# Patient Record
Sex: Female | Born: 2000 | Hispanic: Yes | Smoking: Never smoker
Health system: Southern US, Community
[De-identification: ages and names within clinical notes are randomized; demographics above are authoritative.]

## PROBLEM LIST (undated history)

## (undated) ENCOUNTER — Inpatient Hospital Stay (HOSPITAL_COMMUNITY): Payer: Self-pay

## (undated) DIAGNOSIS — F32A Depression, unspecified: Secondary | ICD-10-CM

## (undated) DIAGNOSIS — F419 Anxiety disorder, unspecified: Secondary | ICD-10-CM

## (undated) DIAGNOSIS — Z789 Other specified health status: Secondary | ICD-10-CM

## (undated) HISTORY — DX: Anxiety disorder, unspecified: F41.9

## (undated) HISTORY — DX: Depression, unspecified: F32.A

## (undated) HISTORY — PX: NO PAST SURGERIES: SHX2092

---
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
## 2019-05-25 DIAGNOSIS — F329 Major depressive disorder, single episode, unspecified: Secondary | ICD-10-CM | POA: Insufficient documentation

## 2019-06-14 ENCOUNTER — Other Ambulatory Visit: Payer: Self-pay

## 2019-06-14 ENCOUNTER — Ambulatory Visit: Payer: Medicaid Other | Attending: Internal Medicine

## 2019-06-14 DIAGNOSIS — Z20822 Contact with and (suspected) exposure to covid-19: Secondary | ICD-10-CM

## 2019-06-15 LAB — NOVEL CORONAVIRUS, NAA: SARS-CoV-2, NAA: NOT DETECTED

## 2019-06-19 ENCOUNTER — Inpatient Hospital Stay (HOSPITAL_COMMUNITY)
Admission: AD | Admit: 2019-06-19 | Discharge: 2019-06-19 | Disposition: A | Discharge status: Home / Self Care | Payer: Medicaid Other | Attending: Obstetrics & Gynecology | Admitting: Obstetrics & Gynecology

## 2019-06-19 ENCOUNTER — Other Ambulatory Visit: Payer: Self-pay

## 2019-06-19 ENCOUNTER — Encounter (HOSPITAL_COMMUNITY): Payer: Self-pay

## 2019-06-19 ENCOUNTER — Inpatient Hospital Stay (HOSPITAL_COMMUNITY): Payer: Medicaid Other

## 2019-06-19 DIAGNOSIS — O26891 Other specified pregnancy related conditions, first trimester: Secondary | ICD-10-CM | POA: Diagnosis not present

## 2019-06-19 DIAGNOSIS — Z3A08 8 weeks gestation of pregnancy: Secondary | ICD-10-CM | POA: Insufficient documentation

## 2019-06-19 DIAGNOSIS — R109 Unspecified abdominal pain: Secondary | ICD-10-CM | POA: Insufficient documentation

## 2019-06-19 DIAGNOSIS — Z3490 Encounter for supervision of normal pregnancy, unspecified, unspecified trimester: Secondary | ICD-10-CM

## 2019-06-19 DIAGNOSIS — Z3A01 Less than 8 weeks gestation of pregnancy: Secondary | ICD-10-CM | POA: Diagnosis not present

## 2019-06-19 DIAGNOSIS — O209 Hemorrhage in early pregnancy, unspecified: Secondary | ICD-10-CM | POA: Insufficient documentation

## 2019-06-19 DIAGNOSIS — Z6790 Unspecified blood type, Rh positive: Secondary | ICD-10-CM

## 2019-06-19 DIAGNOSIS — O26899 Other specified pregnancy related conditions, unspecified trimester: Secondary | ICD-10-CM

## 2019-06-19 HISTORY — DX: Other specified health status: Z78.9

## 2019-06-19 LAB — COMPREHENSIVE METABOLIC PANEL WITH GFR
ALT: 19 U/L (ref 0–44)
AST: 20 U/L (ref 15–41)
Albumin: 3.4 g/dL — ABNORMAL LOW (ref 3.5–5.0)
Alkaline Phosphatase: 56 U/L (ref 38–126)
Anion gap: 8 (ref 5–15)
BUN: 5 mg/dL — ABNORMAL LOW (ref 6–20)
CO2: 24 mmol/L (ref 22–32)
Calcium: 8.9 mg/dL (ref 8.9–10.3)
Chloride: 104 mmol/L (ref 98–111)
Creatinine, Ser: 0.48 mg/dL (ref 0.44–1.00)
GFR calc Af Amer: >60 mL/min
GFR calc non Af Amer: >60 mL/min
Glucose, Bld: 97 mg/dL (ref 70–99)
Potassium: 4 mmol/L (ref 3.5–5.1)
Sodium: 136 mmol/L (ref 135–145)
Total Bilirubin: 0.6 mg/dL (ref 0.3–1.2)
Total Protein: 6.5 g/dL (ref 6.5–8.1)

## 2019-06-19 LAB — URINALYSIS, ROUTINE W REFLEX MICROSCOPIC
Bilirubin Urine: NEGATIVE
Glucose, UA: NEGATIVE mg/dL
Hgb urine dipstick: NEGATIVE
Ketones, ur: NEGATIVE mg/dL
Leukocytes,Ua: NEGATIVE
Nitrite: NEGATIVE
Protein, ur: NEGATIVE mg/dL
Specific Gravity, Urine: 1.014 (ref 1.005–1.030)
pH: 7 (ref 5.0–8.0)

## 2019-06-19 LAB — WET PREP, GENITAL
Clue Cells Wet Prep HPF POC: NONE SEEN
Sperm: NONE SEEN
Trich, Wet Prep: NONE SEEN
Yeast Wet Prep HPF POC: NONE SEEN

## 2019-06-19 LAB — CBC
HCT: 38.5 % (ref 36.0–46.0)
Hemoglobin: 12.2 g/dL (ref 12.0–15.0)
MCH: 24.3 pg — ABNORMAL LOW (ref 26.0–34.0)
MCHC: 31.7 g/dL (ref 30.0–36.0)
MCV: 76.7 fL — ABNORMAL LOW (ref 80.0–100.0)
Platelets: 240 10*3/uL (ref 150–400)
RBC: 5.02 MIL/uL (ref 3.87–5.11)
RDW: 16.3 % — ABNORMAL HIGH (ref 11.5–15.5)
WBC: 8 10*3/uL (ref 4.0–10.5)
nRBC: 0 % (ref 0.0–0.2)

## 2019-06-19 LAB — HCG, QUANTITATIVE, PREGNANCY: hCG, Beta Chain, Quant, S: 30967 m[IU]/mL — ABNORMAL HIGH (ref <5)

## 2019-06-19 NOTE — MAU Provider Note (Signed)
History     CSN: [id number]  Arrival date and time: 06/19/19 5277   First Provider Initiated Contact with Patient 06/19/19 1149      Chief Complaint  Patient presents with  . Vaginal Bleeding  . Abdominal Pain   [name] is a 19 y.o. G1P0 at [redacted]w[redacted]d who presents to MAU for pelvic pain. Pt reports she experienced this pain before when she had chlamydia. Pt reports she experienced "light pink blood" on Saturday of last week, not present today.  Onset: 2days ago Location: pelvis, bilateral Duration: 2days Character: "tug," cramp, denies sharp pain Aggravating/Associated: none/none Relieving: drinking water Treatment: none Severity: 4/10  Pt denies VB, vaginal discharge/odor/itching. Pt denies N/V, abdominal pain, constipation, diarrhea, or urinary problems. Pt denies fever, chills, fatigue, sweating or changes in appetite. Pt denies SOB or chest pain. Pt denies dizziness, HA, light-headedness, weakness.  Problems this pregnancy include: pt has not yet been seen. Allergies? NKDA Current medications/supplements? PNV, cranberry supplements Prenatal care provider? Pt reports she has an appointment scheduled 07/04/2019   OB History    Gravida  1   Para      Term      Preterm      AB      Living        SAB      TAB      Ectopic      Multiple      Live Births              Past Medical History:  Diagnosis Date  . Medical history non-contributory     Past Surgical History:  Procedure Laterality Date  . NO PAST SURGERIES      Family History  Problem Relation Age of Onset  . Diabetes Maternal Grandmother   . Cancer Maternal Grandfather        colon    Social History   Tobacco Use  . Smoking status: Never Smoker  . Smokeless tobacco: Never Used  Substance Use Topics  . Alcohol use: Yes  . Drug use: Not Currently    Types: Marijuana    Allergies: No Known Allergies  No medications prior to admission.    Review of Systems   Constitutional: Negative for chills, diaphoresis, fatigue and fever.  Eyes: Negative for visual disturbance.  Respiratory: Negative for shortness of breath.   Cardiovascular: Negative for chest pain.  Gastrointestinal: Negative for abdominal pain, constipation, diarrhea, nausea and vomiting.  Genitourinary: Positive for pelvic pain. Negative for dysuria, flank pain, frequency, urgency, vaginal bleeding and vaginal discharge.  Neurological: Negative for dizziness, weakness, light-headedness and headaches.   Physical Exam   Blood pressure 120/64, pulse 96, temperature 98.6 F (37 C), temperature source Oral, resp. rate 16, last menstrual period 04/23/2019, SpO2 98 %.  Patient Vitals for the past 24 hrs:  BP Temp Temp src Pulse Resp SpO2  06/19/19 0934 120/64 98.6 F (37 C) Oral 96 16 98 %   Physical Exam  Constitutional: She is oriented to person, place, and time. She appears well-developed and well-nourished. No distress.  HENT:  Head: Normocephalic and atraumatic.  Respiratory: Effort normal.  GI: Soft. She exhibits no distension and no mass. There is no abdominal tenderness. There is no rebound and no guarding.  Neurological: She is alert and oriented to person, place, and time.  Skin: Skin is warm and dry. She is not diaphoretic.  Psychiatric: She has a normal mood and affect. Her behavior is normal. Judgment and thought  content normal.  Pt declines pelvic exam.  Results for orders placed or performed during the hospital encounter of 06/19/19 (from the past 24 hour(s))  CBC     Status: Abnormal   Collection Time: 06/19/19 10:28 AM  Result Value Ref Range   WBC 8.0 4.0 - 10.5 K/uL   RBC 5.02 3.87 - 5.11 MIL/uL   Hemoglobin 12.2 12.0 - 15.0 g/dL   HCT 14.4 31.5 - 40.0 %   MCV 76.7 (L) 80.0 - 100.0 fL   MCH 24.3 (L) 26.0 - 34.0 pg   MCHC 31.7 30.0 - 36.0 g/dL   RDW 86.7 (H) 61.9 - 50.9 %   Platelets 240 150 - 400 K/uL   nRBC 0.0 0.0 - 0.2 %  Comprehensive metabolic panel      Status: Abnormal   Collection Time: 06/19/19 10:28 AM  Result Value Ref Range   Sodium 136 135 - 145 mmol/L   Potassium 4.0 3.5 - 5.1 mmol/L   Chloride 104 98 - 111 mmol/L   CO2 24 22 - 32 mmol/L   Glucose, Bld 97 70 - 99 mg/dL   BUN 5 (L) 6 - 20 mg/dL   Creatinine, Ser 3.26 0.44 - 1.00 mg/dL   Calcium 8.9 8.9 - 71.2 mg/dL   Total Protein 6.5 6.5 - 8.1 g/dL   Albumin 3.4 (L) 3.5 - 5.0 g/dL   AST 20 15 - 41 U/L   ALT 19 0 - 44 U/L   Alkaline Phosphatase 56 38 - 126 U/L   Total Bilirubin 0.6 0.3 - 1.2 mg/dL   GFR calc non Af Amer >60 >60 mL/min   GFR calc Af Amer >60 >60 mL/min   Anion gap 8 5 - 15  ABO/Rh     Status: None   Collection Time: 06/19/19 10:28 AM  Result Value Ref Range   ABO/RH(D)      A POS Performed at [facility], [address], [place], [place] [address]   hCG, quantitative, pregnancy     Status: Abnormal   Collection Time: 06/19/19 10:28 AM  Result Value Ref Range   hCG, Beta Chain, Quant, S 30,967 (H) <5 mIU/mL  Urinalysis, Routine w reflex microscopic     Status: Abnormal   Collection Time: 06/19/19 10:31 AM  Result Value Ref Range   Color, Urine YELLOW YELLOW   APPearance HAZY (A) CLEAR   Specific Gravity, Urine 1.014 1.005 - 1.030   pH 7.0 5.0 - 8.0   Glucose, UA NEGATIVE NEGATIVE mg/dL   Hgb urine dipstick NEGATIVE NEGATIVE   Bilirubin Urine NEGATIVE NEGATIVE   Ketones, ur NEGATIVE NEGATIVE mg/dL   Protein, ur NEGATIVE NEGATIVE mg/dL   Nitrite NEGATIVE NEGATIVE   Leukocytes,Ua NEGATIVE NEGATIVE  Wet prep, genital     Status: Abnormal   Collection Time: 06/19/19 10:31 AM  Result Value Ref Range   Yeast Wet Prep HPF POC NONE SEEN NONE SEEN   Trich, Wet Prep NONE SEEN NONE SEEN   Clue Cells Wet Prep HPF POC NONE SEEN NONE SEEN   WBC, Wet Prep HPF POC MANY (A) NONE SEEN   Sperm NONE SEEN    US OB LESS THAN 14 WEEKS WITH OB TRANSVAGINAL  Result Date: 06/19/2019 CLINICAL DATA:  Abdominal pain.  Pregnant patient. EXAM: OBSTETRIC <14 WK  [place] AND TRANSVAGINAL OB US TECHNIQUE: Both transabdominal and transvaginal ultrasound examinations were performed for complete evaluation of the gestation as well as the maternal uterus, adnexal regions, and pelvic cul-de-sac.  Transvaginal technique was performed to assess early pregnancy. COMPARISON:  None. FINDINGS: Intrauterine gestational sac: Single Yolk sac:  Visualized. Embryo:  Visualized. Cardiac Activity: Visualized. Heart Rate: 120 bpm MSD:   mm    w     d CRL:  3.3 mm   5 w   6 d                  [place] [facility]: February 13, 2020 Subchorionic hemorrhage:  None visualized. Maternal uterus/adnexae: Normal in appearance. IMPRESSION: Single live IUP. No abnormalities identified to explain the patient's pain. Electronically Signed   By: Dorise Bullion III M.D   On: 06/19/2019 12:05   MAU Course  Procedures  MDM -r/o ectopic -UA: hazy, sending urine for culture based on symptoms -CBC: WNL -CMP: WNL -[place]: single IUP [redacted]w[redacted]d, FHR 120 -hCG: 01,093 -ABO: A Positive -WetPrep: WNL -GC/CT collected -pt discharged to home in stable condition  Orders Placed This Encounter  Procedures  . Wet prep, genital    Standing Status:   Standing    Number of Occurrences:   1  . Culture, OB Urine    Standing Status:   Standing    Number of Occurrences:   1  . US OB LESS THAN 14 WEEKS WITH OB TRANSVAGINAL    Standing Status:   Standing    Number of Occurrences:   1    Order Specific Question:   Symptom/Reason for Exam    Answer:   Abdominal pain in pregnancy [[id number]]  . Urinalysis, Routine w reflex microscopic    Standing Status:   Standing    Number of Occurrences:   1  . CBC    Standing Status:   Standing    Number of Occurrences:   1  . Comprehensive metabolic panel    Standing Status:   Standing    Number of Occurrences:   1  . hCG, quantitative, pregnancy    Standing Status:   Standing    Number of Occurrences:   1  . ABO/Rh    Standing Status:   Standing    Number of Occurrences:   1  .  Discharge patient    Order Specific Question:   Discharge disposition    Answer:   01-Home or Self Care [1]    Order Specific Question:   Discharge patient date    Answer:   06/19/2019   No orders of the defined types were placed in this encounter.  Assessment and Plan   1. Abdominal pain in pregnancy   2. Intrauterine pregnancy   3. Blood type, Rh positive   4. [redacted] weeks gestation of pregnancy    Allergies as of 06/19/2019   No Known Allergies     Medication List    You have not been prescribed any medications.    -will call with culture results, if positive -start prenatal care -safe meds list given -pt discharged to home in stable condition  Elmyra Ricks E Alucard Fearnow 06/19/2019, 12:29 PM

## 2019-06-19 NOTE — Discharge Instructions (Signed)
Abdominal Pain During Pregnancy  Abdominal pain is common during pregnancy, and has many possible causes. Some causes are more serious than others, and sometimes the cause is not known. Abdominal pain can be a sign that labor is starting. It can also be caused by normal growth and stretching of muscles and ligaments during pregnancy. Always tell your health care provider if you have any abdominal pain. Follow these instructions at home:  Do not have sex or put anything in your vagina until your pain goes away completely.  Get plenty of rest until your pain improves.  Drink enough fluid to keep your urine pale yellow.  Take over-the-counter and prescription medicines only as told by your health care provider.  Keep all follow-up visits as told by your health care provider. This is important. Contact a health care provider if:  Your pain continues or gets worse after resting.  You have lower abdominal pain that: ? Comes and goes at regular intervals. ? Spreads to your back. ? Is similar to menstrual cramps.  You have pain or burning when you urinate. Get help right away if:  You have a fever or chills.  You have vaginal bleeding.  You are leaking fluid from your vagina.  You are passing tissue from your vagina.  You have vomiting or diarrhea that lasts for more than 24 hours.  Your baby is moving less than usual.  You feel very weak or faint.  You have shortness of breath.  You develop severe pain in your upper abdomen. Summary  Abdominal pain is common during pregnancy, and has many possible causes.  If you experience abdominal pain during pregnancy, tell your health care provider right away.  Follow your health care provider's home care instructions and keep all follow-up visits as directed. This information is not intended to replace advice given to you by your health care provider. Make sure you discuss any questions you have with your health care  provider. Document Revised: 08/24/2018 Document Reviewed: 08/08/2016 Elsevier Patient Education  2020 [employer]                     Safe Medications in Pregnancy    Acne: Benzoyl Peroxide Salicylic Acid  Backache/Headache: Tylenol: 2 regular strength every 4 hours OR              2 Extra strength every 6 hours  Colds/Coughs/Allergies: Benadryl (alcohol free) 25 mg every 6 hours as needed Breath right strips Claritin Cepacol throat lozenges Chloraseptic throat spray Cold-Eeze- up to three times per day Cough drops, alcohol free Flonase (by prescription only) Guaifenesin Mucinex Robitussin DM (plain only, alcohol free) Saline nasal spray/drops Sudafed (pseudoephedrine) & Actifed ** use only after [redacted] weeks gestation and if you do not have high blood pressure Tylenol Vicks Vaporub Zinc lozenges Zyrtec   Constipation: Colace Ducolax suppositories Fleet enema Glycerin suppositories Metamucil Milk of magnesia Miralax Senokot Smooth move tea  Diarrhea: Kaopectate Imodium A-D  *NO pepto Bismol  Hemorrhoids: Anusol Anusol HC Preparation H Tucks  Indigestion: Tums Maalox Mylanta Zantac  Pepcid  Insomnia: Benadryl (alcohol free) 25mg  every 6 hours as needed Tylenol PM Unisom, no Gelcaps  Leg Cramps: Tums MagGel  Nausea/Vomiting:  Bonine Dramamine Emetrol Ginger extract Sea bands Meclizine  Nausea medication to take during pregnancy:  Unisom (doxylamine succinate 25 mg tablets) Take one tablet daily at bedtime. If symptoms are not adequately controlled, the dose can be increased to a maximum recommended dose of two  tablets daily (1/2 tablet in the morning, 1/2 tablet mid-afternoon and one at bedtime). Vitamin B6 100mg  tablets. Take one tablet twice a day (up to 200 mg per day).  Skin Rashes: Aveeno products Benadryl cream or 25mg  every 6 hours as needed Calamine Lotion 1% cortisone cream  Yeast infection: Gyne-lotrimin 7 Monistat  7   **If taking multiple medications, please check labels to avoid duplicating the same active ingredients **take medication as directed on the label ** Do not exceed 4000 mg of tylenol in 24 hours **Do not take medications that contain aspirin or ibuprofen     Vaginal Bleeding During Pregnancy, First Trimester  A small amount of bleeding from the vagina (spotting) is relatively common during early pregnancy. It usually stops on its own. Various things may cause bleeding or spotting during early pregnancy. Some bleeding may be related to the pregnancy, and some may not. In many cases, the bleeding is normal and is not a problem. However, bleeding can also be a sign of something serious. Be sure to tell your health care provider about any vaginal bleeding right away. Some possible causes of vaginal bleeding during the first trimester include:  Infection or inflammation of the cervix.  Growths (polyps) on the cervix.  Miscarriage or threatened miscarriage.  Pregnancy tissue developing outside of the uterus (ectopic pregnancy).  A mass of tissue developing in the uterus due to an egg being fertilized incorrectly (molar pregnancy). Follow these instructions at home: Activity  Follow instructions from your health care provider about limiting your activity. Ask what activities are safe for you.  If needed, make plans for someone to help with your regular activities.  Do not have sex or orgasms until your health care provider says that this is safe. General instructions  Take over-the-counter and prescription medicines only as told by your health care provider.  Pay attention to any changes in your symptoms.  Do not use tampons or douche.  Write down how many pads you use each day, how often you change pads, and how soaked (saturated) they are.  If you pass any tissue from your vagina, save the tissue so you can show it to your health care provider.  Keep all follow-up visits as  told by your health care provider. This is important. Contact a health care provider if:  You have vaginal bleeding during any part of your pregnancy.  You have cramps or labor pains.  You have a fever. Get help right away if:  You have severe cramps in your back or abdomen.  You pass large clots or a large amount of tissue from your vagina.  Your bleeding increases.  You feel light-headed or weak, or you faint.  You have chills.  You are leaking fluid or have a gush of fluid from your vagina. Summary  A small amount of bleeding (spotting) from the vagina is relatively common during early pregnancy.  Various things may cause bleeding or spotting in early pregnancy.  Be sure to tell your health care provider about any vaginal bleeding right away. This information is not intended to replace advice given to you by your health care provider. Make sure you discuss any questions you have with your health care provider. Document Revised: 08/25/2018 Document Reviewed: 08/08/2016 Elsevier Patient Education  [place] of Pregnancy The first trimester of pregnancy is from week 1 until the end of week 13 (months 1 through 3). A week after a sperm fertilizes an egg, the egg  will implant on the wall of the uterus. This embryo will begin to develop into a baby. Genes from you and your partner will form the baby. The female genes will determine whether the baby will be a boy or a girl. At 6-8 weeks, the eyes and face will be formed, and the heartbeat can be seen on ultrasound. At the end of 12 weeks, all the baby's organs will be formed. Now that you are pregnant, you will want to do everything you can to have a healthy baby. Two of the most important things are to get good prenatal care and to follow your health care provider's instructions. Prenatal care is all the medical care you receive before the baby's birth. This care will help prevent, find, and treat any problems  during the pregnancy and childbirth. Body changes during your first trimester Your body goes through many changes during pregnancy. The changes vary from woman to woman.  You may gain or lose a couple of pounds at first.  You may feel sick to your stomach (nauseous) and you may throw up (vomit). If the vomiting is uncontrollable, call your health care provider.  You may tire easily.  You may develop headaches that can be relieved by medicines. All medicines should be approved by your health care provider.  You may urinate more often. Painful urination may mean you have a bladder infection.  You may develop heartburn as a result of your pregnancy.  You may develop constipation because certain hormones are causing the muscles that push stool through your intestines to slow down.  You may develop hemorrhoids or swollen veins (varicose veins).  Your breasts may begin to grow larger and become tender. Your nipples may stick out more, and the tissue that surrounds them (areola) may become darker.  Your gums may bleed and may be sensitive to brushing and flossing.  Dark spots or blotches (chloasma, mask of pregnancy) may develop on your face. This will likely fade after the baby is born.  Your menstrual periods will stop.  You may have a loss of appetite.  You may develop cravings for certain kinds of food.  You may have changes in your emotions from day to day, such as being excited to be pregnant or being concerned that something may go wrong with the pregnancy and baby.  You may have more vivid and strange dreams.  You may have changes in your hair. These can include thickening of your hair, rapid growth, and changes in texture. Some women also have hair loss during or after pregnancy, or hair that feels dry or thin. Your hair will most likely return to normal after your baby is born. What to expect at prenatal visits During a routine prenatal visit:  You will be weighed to make  sure you and the baby are growing normally.  Your blood pressure will be taken.  Your abdomen will be measured to track your baby's growth.  The fetal heartbeat will be listened to between weeks 10 and 14 of your pregnancy.  Test results from any previous visits will be discussed. Your health care provider may ask you:  How you are feeling.  If you are feeling the baby move.  If you have had any abnormal symptoms, such as leaking fluid, bleeding, severe headaches, or abdominal cramping.  If you are using any tobacco products, including cigarettes, chewing tobacco, and electronic cigarettes.  If you have any questions. Other tests that may be performed during your first trimester  include:  Blood tests to find your blood type and to check for the presence of any previous infections. The tests will also be used to check for low iron levels (anemia) and protein on red blood cells (Rh antibodies). Depending on your risk factors, or if you previously had diabetes during pregnancy, you may have tests to check for high blood sugar that affects pregnant women (gestational diabetes).  Urine tests to check for infections, diabetes, or protein in the urine.  An ultrasound to confirm the proper growth and development of the baby.  Fetal screens for spinal cord problems (spina bifida) and Down syndrome.  HIV (human immunodeficiency virus) testing. Routine prenatal testing includes screening for HIV, unless you choose not to have this test.  You may need other tests to make sure you and the baby are doing well. Follow these instructions at home: Medicines  Follow your health care provider's instructions regarding medicine use. Specific medicines may be either safe or unsafe to take during pregnancy.  Take a prenatal vitamin that contains at least 600 micrograms (mcg) of folic acid.  If you develop constipation, try taking a stool softener if your health care provider approves. Eating and  drinking   Eat a balanced diet that includes fresh fruits and vegetables, whole grains, good sources of protein such as meat, eggs, or tofu, and low-fat dairy. Your health care provider will help you determine the amount of weight gain that is right for you.  Avoid raw meat and uncooked cheese. These carry germs that can cause birth defects in the baby.  Eating four or five small meals rather than three large meals a day may help relieve nausea and vomiting. If you start to feel nauseous, eating a few soda crackers can be helpful. Drinking liquids between meals, instead of during meals, also seems to help ease nausea and vomiting.  Limit foods that are high in fat and processed sugars, such as fried and sweet foods.  To prevent constipation: ? Eat foods that are high in fiber, such as fresh fruits and vegetables, whole grains, and beans. ? Drink enough fluid to keep your urine clear or pale yellow. Activity  Exercise only as directed by your health care provider. Most women can continue their usual exercise routine during pregnancy. Try to exercise for 30 minutes at least 5 days a week. Exercising will help you: ? Control your weight. ? Stay in shape. ? Be prepared for labor and delivery.  Experiencing pain or cramping in the lower abdomen or lower back is a good sign that you should stop exercising. Check with your health care provider before continuing with normal exercises.  Try to avoid standing for long periods of time. Move your legs often if you must stand in one place for a long time.  Avoid heavy lifting.  Wear low-heeled shoes and practice good posture.  You may continue to have sex unless your health care provider tells you not to. Relieving pain and discomfort  Wear a good support bra to relieve breast tenderness.  Take warm sitz baths to soothe any pain or discomfort caused by hemorrhoids. Use hemorrhoid cream if your health care provider approves.  Rest with your  legs elevated if you have leg cramps or low back pain.  If you develop varicose veins in your legs, wear support hose. Elevate your feet for 15 minutes, 3-4 times a day. Limit salt in your diet. Prenatal care  Schedule your prenatal visits by the twelfth week of  pregnancy. They are usually scheduled monthly at first, then more often in the last 2 months before delivery.  Write down your questions. Take them to your prenatal visits.  Keep all your prenatal visits as told by your health care provider. This is important. Safety  Wear your seat belt at all times when driving.  Make a list of emergency phone numbers, including numbers for family, friends, the hospital, and police and fire departments. General instructions  Ask your health care provider for a referral to a local prenatal education class. Begin classes no later than the beginning of month 6 of your pregnancy.  Ask for help if you have counseling or nutritional needs during pregnancy. Your health care provider can offer advice or refer you to specialists for help with various needs.  Do not use hot tubs, steam rooms, or saunas.  Do not douche or use tampons or scented sanitary pads.  Do not cross your legs for long periods of time.  Avoid cat litter boxes and soil used by cats. These carry germs that can cause birth defects in the baby and possibly loss of the fetus by miscarriage or stillbirth.  Avoid all smoking, herbs, alcohol, and medicines not prescribed by your health care provider. Chemicals in these products affect the formation and growth of the baby.  Do not use any products that contain nicotine or tobacco, such as cigarettes and e-cigarettes. If you need help quitting, ask your health care provider. You may receive counseling support and other resources to help you quit.  Schedule a dentist appointment. At home, brush your teeth with a soft toothbrush and be gentle when you floss. Contact a health care provider  if:  You have dizziness.  You have mild pelvic cramps, pelvic pressure, or nagging pain in the abdominal area.  You have persistent nausea, vomiting, or diarrhea.  You have a bad smelling vaginal discharge.  You have pain when you urinate.  You notice increased swelling in your face, hands, legs, or ankles.  You are exposed to fifth disease or chickenpox.  You are exposed to [place] measles (rubella) and have never had it. Get help right away if:  You have a fever.  You are leaking fluid from your vagina.  You have spotting or bleeding from your vagina.  You have severe abdominal cramping or pain.  You have rapid weight gain or loss.  You vomit blood or material that looks like coffee grounds.  You develop a severe headache.  You have shortness of breath.  You have any kind of trauma, such as from a fall or a car accident. Summary  The first trimester of pregnancy is from week 1 until the end of week 13 (months 1 through 3).  Your body goes through many changes during pregnancy. The changes vary from woman to woman.  You will have routine prenatal visits. During those visits, your health care provider will examine you, discuss any test results you may have, and talk with you about how you are feeling. This information is not intended to replace advice given to you by your health care provider. Make sure you discuss any questions you have with your health care provider. Document Revised: 04/18/2017 Document Reviewed: 04/17/2016 Elsevier Patient Education  2020 [employer].

## 2019-06-19 NOTE — [facility]U Note (Signed)
Michele Carney is a 19 y.o. at [redacted]w[redacted]d here in [facility]U reporting:  +left lower abdominal pain Intermittent Cramping Onset: 2 days ago Pain score: 4/10 Has not taken anything for the pain as she reports she was unsure what was safe to take during pregnancy.   +vaginal bleeding Intermittent Pink in color Onset: 1 week ago   LMP: 04/23/19   Vitals:   06/19/19 0934  BP: 120/64  Pulse: 96  Resp: 16  Temp: 98.6 F (37 C)  SpO2: 98%     Lab orders placed from triage: ua. Patient unable to leave specimen at this time. Specimen cup and instructions left at bedside.

## 2019-06-20 LAB — CULTURE, OB URINE

## 2019-06-21 LAB — ABO/RH: ABO/RH(D): A POS

## 2019-06-21 LAB — GC/CHLAMYDIA PROBE AMP (~~LOC~~) NOT AT ARMC
Chlamydia: NEGATIVE
Comment: NEGATIVE
Comment: NORMAL
Neisseria Gonorrhea: NEGATIVE

## 2019-07-02 ENCOUNTER — Inpatient Hospital Stay (HOSPITAL_COMMUNITY)
Admission: AD | Admit: 2019-07-02 | Discharge: 2019-07-02 | Disposition: A | Discharge status: Home / Self Care | Payer: Medicaid Other | Attending: Obstetrics & Gynecology | Admitting: Obstetrics & Gynecology

## 2019-07-02 ENCOUNTER — Encounter (HOSPITAL_COMMUNITY): Payer: Self-pay | Admitting: Obstetrics & Gynecology

## 2019-07-02 ENCOUNTER — Other Ambulatory Visit: Payer: Self-pay

## 2019-07-02 DIAGNOSIS — O218 Other vomiting complicating pregnancy: Secondary | ICD-10-CM | POA: Insufficient documentation

## 2019-07-02 DIAGNOSIS — Z3A01 Less than 8 weeks gestation of pregnancy: Secondary | ICD-10-CM | POA: Diagnosis not present

## 2019-07-02 DIAGNOSIS — O210 Mild hyperemesis gravidarum: Secondary | ICD-10-CM

## 2019-07-02 LAB — URINALYSIS, ROUTINE W REFLEX MICROSCOPIC
Bilirubin Urine: NEGATIVE
Glucose, UA: NEGATIVE mg/dL
Hgb urine dipstick: NEGATIVE
Ketones, ur: 15 mg/dL — AB
Leukocytes,Ua: NEGATIVE
Nitrite: NEGATIVE
Protein, ur: 30 mg/dL — AB
Specific Gravity, Urine: 1.02 (ref 1.005–1.030)
pH: 8 (ref 5.0–8.0)

## 2019-07-02 LAB — COMPREHENSIVE METABOLIC PANEL
ALT: 19 U/L (ref 0–44)
AST: 21 U/L (ref 15–41)
Albumin: 3.6 g/dL (ref 3.5–5.0)
Alkaline Phosphatase: 54 U/L (ref 38–126)
Anion gap: 10 (ref 5–15)
BUN: 5 mg/dL — ABNORMAL LOW (ref 6–20)
CO2: 22 mmol/L (ref 22–32)
Calcium: 9.3 mg/dL (ref 8.9–10.3)
Chloride: 104 mmol/L (ref 98–111)
Creatinine, Ser: 0.51 mg/dL (ref 0.44–1.00)
GFR calc Af Amer: >60 mL/min (ref >60)
GFR calc non Af Amer: >60 mL/min (ref >60)
Glucose, Bld: 99 mg/dL (ref 70–99)
Potassium: 4.2 mmol/L (ref 3.5–5.1)
Sodium: 136 mmol/L (ref 135–145)
Total Bilirubin: 0.4 mg/dL (ref 0.3–1.2)
Total Protein: 7.1 g/dL (ref 6.5–8.1)

## 2019-07-02 LAB — CBC
HCT: 39.7 % (ref 36.0–46.0)
Hemoglobin: 12.5 g/dL (ref 12.0–15.0)
MCH: 24.7 pg — ABNORMAL LOW (ref 26.0–34.0)
MCHC: 31.5 g/dL (ref 30.0–36.0)
MCV: 78.5 fL — ABNORMAL LOW (ref 80.0–100.0)
Platelets: 280 10*3/uL (ref 150–400)
RBC: 5.06 MIL/uL (ref 3.87–5.11)
RDW: 16.8 % — ABNORMAL HIGH (ref 11.5–15.5)
WBC: 9.4 10*3/uL (ref 4.0–10.5)
nRBC: 0 % (ref 0.0–0.2)

## 2019-07-02 LAB — URINALYSIS, MICROSCOPIC (REFLEX): RBC / HPF: NONE SEEN RBC/hpf (ref 0–5)

## 2019-07-02 MED ORDER — SODIUM CHLORIDE 0.9 % IV SOLN
8.0000 mg | Freq: Once | INTRAVENOUS | Status: DC
  Filled 2019-07-02: qty 4

## 2019-07-02 MED ORDER — PROMETHAZINE HCL 25 MG/ML IJ SOLN
25.0000 mg | Freq: Once | INTRAVENOUS | Status: AC
  Administered 2019-07-02: 25 mg via INTRAVENOUS
  Filled 2019-07-02: qty 1

## 2019-07-02 MED ORDER — M.V.I. ADULT IV INJ
Freq: Once | INTRAVENOUS | Status: AC
  Filled 2019-07-02: qty 1000

## 2019-07-02 MED ORDER — FAMOTIDINE IN NACL 20-0.9 MG/50ML-% IV SOLN
20.0000 mg | Freq: Once | INTRAVENOUS | Status: AC
  Administered 2019-07-02: 18:00:00 20 mg via INTRAVENOUS
  Filled 2019-07-02: qty 50

## 2019-07-02 MED ORDER — M.V.I. ADULT IV INJ: Freq: Once | INTRAVENOUS | Status: DC

## 2019-07-02 MED ORDER — FAMOTIDINE 20 MG PO TABS: 20.0000 mg | ORAL_TABLET | Freq: Two times a day (BID) | ORAL | 0 refills | Status: DC

## 2019-07-02 MED ORDER — METOCLOPRAMIDE HCL 10 MG PO TABS: 10.0000 mg | ORAL_TABLET | Freq: Four times a day (QID) | ORAL | 0 refills | Status: DC

## 2019-07-02 NOTE — MAU Note (Signed)
Presents with c/o N&V, reports unable to keep anything down, has vomited 6x today.  Also reports left lower abdominal pain.  Denies VB.  Also reports dizziness @ times.

## 2019-07-02 NOTE — MAU Provider Note (Addendum)
Chief Complaint: Abdominal Pain, Nausea, Emesis, and Dizziness   First Provider Initiated Contact with Patient 07/02/19 1654      SUBJECTIVE HPI: Michele Carney is a 19 y.o. G1P0 at [redacted]w[redacted]d by LMP who presents to maternity admissions reporting a 1 day history of nausea and vomiting. She reports vomiting 6-7 times today. While she has been nauseous throughout pregnancy, she became alarmed by the new-onset vomiting and inability to keep food down. For symptom control, she has not taken any medications, but has tried "ginger drops" without relief. Of note, she has had a decreased appetite over the past 2 weeks and reports a 6 lb weight loss over 3 weeks. She has lower abdominal pain and complains of constant dizziness since this morning that only subsides when she lays down. Patient denies chest pain, SOB, abnormal color changes in vomit (red/black/green), diarrhea, or constipation. She also denies vaginal bleeding, vaginal itching/burning, urinary symptoms, h/a, or fever/chills.    Patient has not had her initial prenatal visit yet, and is scheduled to have it on 2/15 at [facility].   HPI  Past Medical History:  Diagnosis Date  . Medical history non-contributory    Past Surgical History:  Procedure Laterality Date  . NO PAST SURGERIES     Social History   Socioeconomic History  . Marital status: Single    Spouse name: Not on file  . Number of children: Not on file  . Years of education: Not on file  . Highest education level: Not on file  Occupational History  . Not on file  Tobacco Use  . Smoking status: Never Smoker  . Smokeless tobacco: Never Used  Substance and Sexual Activity  . Alcohol use: Not Currently  . Drug use: Not Currently    Types: Marijuana  . Sexual activity: Yes    Comment: on the patch prior  Other Topics Concern  . Not on file  Social History Narrative  . Not on file   Social Determinants of Health   Financial Resource Strain:   . Difficulty of  Paying Living Expenses: Not on file  Food Insecurity:   . Worried About Programme researcher, broadcasting/film/video in the Last Year: Not on file  . Ran Out of Food in the Last Year: Not on file  Transportation Needs:   . Lack of Transportation (Medical): Not on file  . Lack of Transportation (Non-Medical): Not on file  Physical Activity:   . Days of Exercise per Week: Not on file  . Minutes of Exercise per Session: Not on file  Stress:   . Feeling of Stress : Not on file  Social Connections:   . Frequency of Communication with Friends and Family: Not on file  . Frequency of Social Gatherings with Friends and Family: Not on file  . Attends Religious Services: Not on file  . Active Member of Clubs or Organizations: Not on file  . Attends Banker Meetings: Not on file  . Marital Status: Not on file  Intimate Partner Violence:   . Fear of Current or Ex-Partner: Not on file  . Emotionally Abused: Not on file  . Physically Abused: Not on file  . Sexually Abused: Not on file   No current facility-administered medications on file prior to encounter.   Current Outpatient Medications on File Prior to Encounter  Medication Sig Dispense Refill  . Prenatal Vit-Fe Fumarate-FA (PRENATAL MULTIVITAMIN) TABS tablet Take 1 tablet by mouth daily at 12 noon.  No Known Allergies  ROS:  Review of Systems  All other systems reviewed and are negative.  I have reviewed patient's Past Medical Hx, Surgical Hx, Family Hx, Social Hx, medications and allergies.   Physical Exam   Patient Vitals for the past 24 hrs:  BP Temp Temp src Pulse Resp SpO2 Height Weight  07/02/19 1601 122/63 98.5 F (36.9 C) Oral (!) 114 20 98 % 5\' 1"  (1.549 m) 81.7 kg   Constitutional: Well-developed, well-nourished female in no acute distress.  HEENT: Normal conjunctivae, oropharynx with moist mucous membranes Cardiovascular: normal rate and rhythm Respiratory: normal effort, breath sounds clear throughout  GI: Abd soft, normal  active bowel sounds; RUQ, LLQ, and periumbilical tenderness to palpation.  MS: Extremities nontender, no edema, normal ROM Neurologic: Alert and oriented x 4.  GU: Neg CVAT. Uterus is firm and mildly tender to palpation Skin: Normal skin turgor, capillary refill < 2 seconds  LAB RESULTS Results for orders placed or performed during the hospital encounter of 07/02/19 (from the past 24 hour(s))  Urinalysis, Routine w reflex microscopic     Status: Abnormal   Collection Time: 07/02/19  4:22 PM  Result Value Ref Range   Color, Urine YELLOW YELLOW   APPearance CLOUDY (A) CLEAR   Specific Gravity, Urine 1.020 1.005 - 1.030   pH 8.0 5.0 - 8.0   Glucose, UA NEGATIVE NEGATIVE mg/dL   Hgb urine dipstick NEGATIVE NEGATIVE   Bilirubin Urine NEGATIVE NEGATIVE   Ketones, ur 15 (A) NEGATIVE mg/dL   Protein, ur 30 (A) NEGATIVE mg/dL   Nitrite NEGATIVE NEGATIVE   Leukocytes,Ua NEGATIVE NEGATIVE  Urinalysis, Microscopic (reflex)     Status: Abnormal   Collection Time: 07/02/19  4:22 PM  Result Value Ref Range   RBC / HPF NONE SEEN 0 - 5 RBC/hpf   WBC, UA 0-5 0 - 5 WBC/hpf   Bacteria, UA MANY (A) NONE SEEN   Squamous Epithelial / LPF 21-50 0 - 5   Mucus PRESENT    Urine-Other LESS THAN 10 mL OF URINE SUBMITTED   CBC     Status: Abnormal   Collection Time: 07/02/19  5:26 PM  Result Value Ref Range   WBC 9.4 4.0 - 10.5 K/uL   RBC 5.06 3.87 - 5.11 MIL/uL   Hemoglobin 12.5 12.0 - 15.0 g/dL   HCT 39.7 36.0 - 46.0 %   MCV 78.5 (L) 80.0 - 100.0 fL   MCH 24.7 (L) 26.0 - 34.0 pg   MCHC 31.5 30.0 - 36.0 g/dL   RDW 16.8 (H) 11.5 - 15.5 %   Platelets 280 150 - 400 K/uL   nRBC 0.0 0.0 - 0.2 %    --/--/A POS (01/30 1028)  IMAGING US OB LESS THAN 14 WEEKS WITH OB TRANSVAGINAL  Result Date: 06/19/2019 CLINICAL DATA:  Abdominal pain.  Pregnant patient. EXAM: OBSTETRIC <14 WK [place] AND TRANSVAGINAL OB US TECHNIQUE: Both transabdominal and transvaginal ultrasound examinations were performed for complete  evaluation of the gestation as well as the maternal uterus, adnexal regions, and pelvic cul-de-sac. Transvaginal technique was performed to assess early pregnancy. COMPARISON:  None. FINDINGS: Intrauterine gestational sac: Single Yolk sac:  Visualized. Embryo:  Visualized. Cardiac Activity: Visualized. Heart Rate: 120 bpm MSD:   mm    w     d CRL:  3.3 mm   5 w   6 d                  [place] [facility]: February 13, 2020 Subchorionic hemorrhage:  None visualized. Maternal uterus/adnexae: Normal in appearance. IMPRESSION: Single live IUP. No abnormalities identified to explain the patient's pain. Electronically Signed   By: Gerome Sam III M.D   On: 06/19/2019 12:05    MAU Management/MDM: Orders Placed This Encounter  Procedures  . Urinalysis, Routine w reflex microscopic  . Urinalysis, Microscopic (reflex)  . CBC  . Comprehensive metabolic panel    Meds ordered this encounter  Medications  . promethazine (PHENERGAN) 25 mg in lactated ringers 1,000 mL infusion  . DISCONTD: lactated ringers 1,000 mL with multivitamins adult (INFUVITE ADULT) 10 mL infusion  . famotidine (PEPCID) IVPB 20 mg premix  . lactated ringers 1,000 mL with multivitamins adult (INFUVITE ADULT) 10 mL infusion    Michele Carney is a 19 y.o. G1P0 at [redacted]w[redacted]d who presents to maternity admissions reporting a 1 day history of nausea and vomiting, dizziness, and lower abdominal pain. She is normotensive and has a reassuring hydration status on physical exam. Leading diagnosis of nausea and vomiting in pregnancy, but given her dizziness and weight loss, concerns for dehydration and hyperemesis gravidarum. Lower on the differential includes viral or bacterial infectious processes, however patient is afebrile and lacks other infectious symptoms. CBC and CMP were drawn which yielded reassuring findings of a normal WBC and normal electrolyte levels which is supportive to the diagnosis of N/V in pregnancy. Plan for treatment included a bolus of LR  and infusions of promethazine and famotidine. Patient deferred a PO challenge after the infusion, stating "I just want to go home, I feel fine". She has not had any emesis throughout admission. Provided education on warning signs that should prompt additional medical attention including continued emesis, blood-streaks in vomit, worsening dizziness, and loss of consciousness.   Treatments in MAU included IVFs, promethazine, famotidine, and zofran. Pt discharged with routine prenatal precautions.  ASSESSMENT Nausea and vomiting in pregnancy  PLAN Discharge home with outpatient follow-up at scheduled prenatal visit. Prescribed the following medications on discharge: - famotidine - reglan     Faris Almubaslat, Ms3 07/02/2019  6:00 PM   I confirm that I have verified the information documented in the medical student's note and that I have also personally reperformed the history, physical exam and all medical decision making activities of this service and have verified that all service and findings are accurately documented in this student's note.   Raelyn Mora, CNM 07/02/2019 8:48 PM

## 2019-07-02 NOTE — Discharge Instructions (Signed)
Morning Sickness ° °Morning sickness is when you feel sick to your stomach (nauseous) during pregnancy. You may feel sick to your stomach and throw up (vomit). You may feel sick in the morning, but you can feel this way at any time of day. Some women feel very sick to their stomach and cannot stop throwing up (hyperemesis gravidarum). °Follow these instructions at home: °Medicines °· Take over-the-counter and prescription medicines only as told by your doctor. Do not take any medicines until you talk with your doctor about them first. °· Taking multivitamins before getting pregnant can stop or lessen the harshness of morning sickness. °Eating and drinking °· Eat dry toast or crackers before getting out of bed. °· Eat 5 or 6 small meals a day. °· Eat dry and bland foods like rice and baked potatoes. °· Do not eat greasy, fatty, or spicy foods. °· Have someone cook for you if the smell of food causes you to feel sick or throw up. °· If you feel sick to your stomach after taking prenatal vitamins, take them at night or with a snack. °· Eat protein when you need a snack. Nuts, yogurt, and cheese are good choices. °· Drink fluids throughout the day. °· Try ginger ale made with real ginger, ginger tea made from fresh grated ginger, or ginger candies. °General instructions °· Do not use any products that have nicotine or tobacco in them, such as cigarettes and e-cigarettes. If you need help quitting, ask your doctor. °· Use an air purifier to keep the air in your house free of smells. °· Get lots of fresh air. °· Try to avoid smells that make you feel sick. °· Try: °? Wearing a bracelet that is used for seasickness (acupressure wristband). °? Going to a doctor who puts thin needles into certain body points (acupuncture) to improve how you feel. °Contact a doctor if: °· You need medicine to feel better. °· You feel dizzy or light-headed. °· You are losing weight. °Get help right away if: °· You feel very sick to your  stomach and cannot stop throwing up. °· You pass out (faint). °· You have very bad pain in your belly. °Summary °· Morning sickness is when you feel sick to your stomach (nauseous) during pregnancy. °· You may feel sick in the morning, but you can feel this way at any time of day. °· Making some changes to what you eat may help your symptoms go away. °This information is not intended to replace advice given to you by your health care provider. Make sure you discuss any questions you have with your health care provider. °Document Revised: 04/18/2017 Document Reviewed: 06/06/2016 °Elsevier Patient Education © 2020 Elsevier Inc. ° °

## 2019-07-05 ENCOUNTER — Ambulatory Visit (INDEPENDENT_AMBULATORY_CARE_PROVIDER_SITE_OTHER): Payer: Medicaid Other | Admitting: *Deleted

## 2019-07-05 ENCOUNTER — Other Ambulatory Visit: Payer: Self-pay

## 2019-07-05 VITALS — Wt 180.0 lb

## 2019-07-05 DIAGNOSIS — Z3400 Encounter for supervision of normal first pregnancy, unspecified trimester: Secondary | ICD-10-CM

## 2019-07-05 MED ORDER — BLOOD PRESSURE MONITOR AUTOMAT DEVI: 1.0000 | Freq: Every day | 0 refills | Status: DC

## 2019-07-05 MED ORDER — GOJJI WEIGHT SCALE MISC: 1.0000 | Freq: Every day | 0 refills | Status: DC | PRN

## 2019-07-05 NOTE — Progress Notes (Signed)
  Virtual Visit via Telephone Note  I connected with Michele Carney on 07/05/19 at  1:30 PM EST by telephone and verified that I am speaking with the correct person using two identifiers.  Location: Patient: Michele Carney MRN: [id number] Provider: Clovis Pu, RN   I discussed the limitations, risks, security and privacy concerns of performing an evaluation and management service by telephone and the availability of in person appointments. I also discussed with the patient that there may be a patient responsible charge related to this service. The patient expressed understanding and agreed to proceed.   History of Present Illness: PRENATAL INTAKE SUMMARY  Michele Carney presents today New OB Nurse Interview.  OB History    Gravida  1   Para      Term      Preterm      AB      Living        SAB      TAB      Ectopic      Multiple      Live Births             I have reviewed the patient's medical, obstetrical, social, and family histories, medications, and available lab results.  SUBJECTIVE She has no unusual complaints and complains of nausea with vomiting. Patient recently seen at MAU for n/Carney. Medication was prescribed.   Observations/Objective: Initial nurse interview for history/labs (New OB)  EDD: 02/13/2020 by early ultrasound GA: [redacted]w[redacted]d G1P0 FHT: non face to face interview  GENERAL APPEARANCE: non face to face interview  Assessment and Plan: Normal pregnancy Prenatal care-[facility] Labs to be completed at next visit with midwife Patient to sign up for [facility] Rx for BP cuff and weight scale sent to [facility] Continue PNV Continue medication for n/Carney PRN  Follow Up Instructions:   I discussed the assessment and treatment plan with the patient. The patient was provided an opportunity to ask questions and all were answered. The patient agreed with the plan and demonstrated an understanding of the instructions.   The patient  was advised to call back or seek an in-person evaluation if the symptoms worsen or if the condition fails to improve as anticipated.  I provided 15 minutes of non-face-to-face time during this encounter.   Clovis Pu, RN

## 2019-07-12 ENCOUNTER — Encounter: Payer: Self-pay | Admitting: General Practice

## 2019-07-21 ENCOUNTER — Other Ambulatory Visit (HOSPITAL_COMMUNITY)
Admission: RE | Admit: 2019-07-21 | Discharge: 2019-07-21 | Disposition: A | Discharge status: Home / Self Care | Payer: Medicaid Other | Source: Ambulatory Visit | Attending: Advanced Practice Midwife | Admitting: Advanced Practice Midwife

## 2019-07-21 ENCOUNTER — Encounter: Payer: Self-pay | Admitting: General Practice

## 2019-07-21 ENCOUNTER — Ambulatory Visit (INDEPENDENT_AMBULATORY_CARE_PROVIDER_SITE_OTHER): Payer: Medicaid Other | Admitting: Advanced Practice Midwife

## 2019-07-21 ENCOUNTER — Other Ambulatory Visit: Payer: Self-pay

## 2019-07-21 ENCOUNTER — Encounter: Payer: Self-pay | Admitting: Advanced Practice Midwife

## 2019-07-21 DIAGNOSIS — Z3A10 10 weeks gestation of pregnancy: Secondary | ICD-10-CM | POA: Diagnosis not present

## 2019-07-21 DIAGNOSIS — Z3401 Encounter for supervision of normal first pregnancy, first trimester: Secondary | ICD-10-CM | POA: Diagnosis not present

## 2019-07-21 DIAGNOSIS — Z3400 Encounter for supervision of normal first pregnancy, unspecified trimester: Secondary | ICD-10-CM

## 2019-07-21 LAB — POCT URINALYSIS DIPSTICK OB
Bilirubin, UA: NEGATIVE
Blood, UA: NEGATIVE
Glucose, UA: NEGATIVE
Ketones, UA: 40
Leukocytes, UA: NEGATIVE
Nitrite, UA: NEGATIVE
Spec Grav, UA: 1.02 (ref 1.010–1.025)
Urobilinogen, UA: 0.2 E.U./dL
pH, UA: 8 (ref 5.0–8.0)

## 2019-07-21 MED ORDER — PROMETHAZINE HCL 25 MG PO TABS: 12.5000 mg | ORAL_TABLET | Freq: Four times a day (QID) | ORAL | 5 refills | Status: DC | PRN

## 2019-07-21 MED ORDER — ONDANSETRON 8 MG PO TBDP: 8.0000 mg | ORAL_TABLET | Freq: Three times a day (TID) | ORAL | 3 refills | Status: DC | PRN

## 2019-07-21 NOTE — Progress Notes (Signed)
Subjective:   Michele Carney is a 19 y.o. G1P0 at [redacted]w[redacted]d by early ultrasound being seen today for her first obstetrical visit.  Her obstetrical history is significant for none. Patient does intend to breast feed. Pregnancy history fully reviewed.  Patient reports heartburn and nausea. Has tried reglan and pepcid. She states that the pepcid is helping, but she is still having a lot of nausea and vomiting. Her PCP recommended she try unisom and B6, but that has not been helping either.   Patient also treated for yeast infection recently. She used OTC meds x 7 days, but it did not improve. She states that her PCP told her to do the treatment again.  HISTORY: OB History  Gravida Para Term Preterm AB Living  1 0 0 0 0 0  SAB TAB Ectopic Multiple Live Births  0 0 0 0 0    # Outcome Date GA Lbr Len/2nd Weight Sex Delivery Anes PTL Lv  1 Current             Last pap smear was done NA, age and was NA, age   Past Medical History:  Diagnosis Date  . Medical history non-contributory    Past Surgical History:  Procedure Laterality Date  . NO PAST SURGERIES     Family History  Problem Relation Age of Onset  . Diabetes Maternal Grandmother   . Cancer Maternal Grandfather        colon   Social History   Tobacco Use  . Smoking status: Never Smoker  . Smokeless tobacco: Never Used  Substance Use Topics  . Alcohol use: Not Currently  . Drug use: Not Currently    Types: Marijuana   No Known Allergies Current Outpatient Medications on File Prior to Visit  Medication Sig Dispense Refill  . Blood Pressure Monitoring (BLOOD PRESSURE MONITOR AUTOMAT) DEVI 1 Device by Does not apply route daily. Automatic blood pressure cuff regular size. To monitor blood pressure regularly at home. ICD-10 code: O09.90 1 each 0  . famotidine (PEPCID) 20 MG tablet Take 1 tablet (20 mg total) by mouth 2 (two) times daily. 30 tablet 0  . metoCLOPramide (REGLAN) 10 MG tablet Take 1 tablet (10 mg total) by mouth  every 6 (six) hours. 30 tablet 0  . Misc. Devices (GOJJI WEIGHT SCALE) MISC 1 Device by Does not apply route daily as needed. To weight self daily as needed at home. ICD-10 code: O09.90 1 each 0  . Prenatal Vit-Fe Fumarate-FA (PRENATAL MULTIVITAMIN) TABS tablet Take 1 tablet by mouth daily at 12 noon.     No current facility-administered medications on file prior to visit.    Review of Systems Pertinent items noted in HPI and remainder of comprehensive ROS otherwise negative.  Exam   Vitals:   07/21/19 1509  BP: 113/72  Pulse: 99  Temp: 98.1 F (36.7 C)  Weight: 179 lb 6.4 oz (81.4 kg)   Fetal Heart Rate (bpm): 160  Physical Exam  Constitutional: She is oriented to person, place, and time and well-developed, well-nourished, and in no distress. No distress.  HENT:  Head: Normocephalic.  Cardiovascular: Normal rate.  Pulmonary/Chest: Effort normal.  Abdominal: Soft. There is no abdominal tenderness. There is no rebound.  Neurological: She is alert and oriented to person, place, and time.  Skin: Skin is warm and dry.  Psychiatric: Affect normal.  Nursing note and vitals reviewed.   Assessment:   Pregnancy: G1P0 Patient Active Problem List   Diagnosis Date Noted  .  Supervision of normal first pregnancy, antepartum 07/05/2019     Plan:  1. Supervision of normal first pregnancy, antepartum - Urine cytology ancillary only([place]) - Obstetric Panel, Including HIV - Culture, OB Urine - Genetic Screening - Hemoglobin A1c - Hepatitis C Antibody  - rx phenergan and zofran for patient to try  Initial labs drawn. Continue prenatal vitamins. Genetic Screening discussed, AFP and NIPS: requested. Ultrasound discussed; fetal anatomic survey: requested. Problem list reviewed and updated. The nature of  - [facility] with multiple MDs and other Advanced Practice Providers was explained to patient; also emphasized that residents, students  are part of our team. Routine obstetric precautions reviewed. 50% of 45 min visit spent in counseling and coordination of care. Return in about 4 weeks (around 08/18/2019) for virtual visit .  Thressa Sheller DNP, CNM  07/21/19  3:30 PM

## 2019-07-21 NOTE — Patient Instructions (Addendum)
     Please go by [facility] to pick up blood pressure and weight scale.  [facility] - [place], [place] - [address] [phone] (Phone)

## 2019-07-22 ENCOUNTER — Ambulatory Visit (INDEPENDENT_AMBULATORY_CARE_PROVIDER_SITE_OTHER): Payer: Medicaid Other | Admitting: Licensed Clinical Social Worker

## 2019-07-22 DIAGNOSIS — F4321 Adjustment disorder with depressed mood: Secondary | ICD-10-CM | POA: Diagnosis not present

## 2019-07-22 LAB — URINE CYTOLOGY ANCILLARY ONLY
Chlamydia: NEGATIVE
Comment: NEGATIVE
Comment: NORMAL
Neisseria Gonorrhea: NEGATIVE

## 2019-07-22 LAB — OBSTETRIC PANEL, INCLUDING HIV
Basophils Absolute: 0 10*3/uL (ref 0.0–0.2)
Basos: 0 %
EOS (ABSOLUTE): 0 10*3/uL (ref 0.0–0.4)
Eos: 0 %
HIV Screen 4th Generation wRfx: NONREACTIVE
Hematocrit: 36.2 % (ref 34.0–46.6)
Hemoglobin: 12.2 g/dL (ref 11.1–15.9)
Hepatitis B Surface Ag: NEGATIVE
Immature Grans (Abs): 0 10*3/uL (ref 0.0–0.1)
Immature Granulocytes: 0 %
Lymphocytes Absolute: 3 10*3/uL (ref 0.7–3.1)
Lymphs: 30 %
MCH: 25.8 pg — ABNORMAL LOW (ref 26.6–33.0)
MCHC: 33.7 g/dL (ref 31.5–35.7)
MCV: 77 fL — ABNORMAL LOW (ref 79–97)
Monocytes Absolute: 0.5 10*3/uL (ref 0.1–0.9)
Monocytes: 5 %
Neutrophils Absolute: 6.2 10*3/uL (ref 1.4–7.0)
Neutrophils: 65 %
Platelets: 251 10*3/uL (ref 150–450)
RBC: 4.73 x10E6/uL (ref 3.77–5.28)
RDW: 17 % — ABNORMAL HIGH (ref 11.7–15.4)
RPR Ser Ql: NONREACTIVE
Rh Factor: POSITIVE
Rubella Antibodies, IGG: 3.2 index (ref >0.99)
WBC: 9.8 10*3/uL (ref 3.4–10.8)

## 2019-07-22 LAB — AB SCR+ANTIBODY ID

## 2019-07-22 LAB — HEPATITIS C ANTIBODY: Hep C Virus Ab: <0.1 s/co ratio (ref 0.0–0.9)

## 2019-07-22 LAB — HEMOGLOBIN A1C
Est. average glucose Bld gHb Est-mCnc: 105 mg/dL
Hgb A1c MFr Bld: 5.3 % (ref 4.8–5.6)

## 2019-07-22 NOTE — BH Specialist Note (Signed)
Integrated Behavioral Health Initial Visit  MRN: [id number] Name: Nakai Yard  Number of Integrated Behavioral Health Clinician visits:: 1 Session Start time: 10:01am  Session End time: 10:33am Total time: 32 mins   Type of Service: Integrated Behavioral Health- Individual Interpretor:no Interpretor Name and Language: none    Warm Hand Off Completed.       SUBJECTIVE: Karalynn Cottone is a 19 y.o. female  Patient was referred by RN TMartin for high phq9 scores (15) GAD7 (17) Patient reports the following symptoms/concerns: isolation, annoyed, angry, crying Duration of problem: five months ; Severity of problem: mild  OBJECTIVE: Mood:dysphoric  and Affect: normal Risk of harm to self or others: no risk of harm to self or others   LIFE CONTEXT: Family and Social: Lives in [place] with mother  School/Work: recently drop out due to difficulty with online platform. Patient was recently enrolled in [facility] Self-Care: n/a Life Changes: new pregnancy  GOALS ADDRESSED: Patient will: 1. Reduce symptoms of: isolation, irritability, sadness 2. Increase knowledge and/or ability of:  Triggering symptoms associated with adjustment disorder 3. Demonstrate ability to: self manage symptoms   INTERVENTIONS: Interventions utilized: solution focused brief therapy  Standardized Assessments completed: phq9 gad 7  ASSESSMENT: Patient currently experiencing adjustment disorder w/ depressed mood    Patient may benefit from integrated behavioral health   PLAN: 1. Follow up with behavioral health clinician on : in 4 weeks  2. Behavioral recommendations: Integrated behavioral plan 3. Referral(s):  4. "From scale of 1-10, how likely are you to follow plan?":   Gwyndolyn Saxon, LCSW

## 2019-07-23 MED ORDER — CEPHALEXIN 500 MG PO CAPS: 500.0000 mg | ORAL_CAPSULE | Freq: Three times a day (TID) | ORAL | 0 refills | Status: DC

## 2019-07-23 NOTE — Addendum Note (Signed)
Addended by: Thressa Sheller D on: 07/23/2019 08:40 AM   Modules accepted: Orders

## 2019-07-27 LAB — URINE CULTURE, OB REFLEX

## 2019-07-27 LAB — CULTURE, OB URINE

## 2019-08-02 ENCOUNTER — Encounter: Payer: Self-pay | Admitting: General Practice

## 2019-08-10 ENCOUNTER — Inpatient Hospital Stay (HOSPITAL_COMMUNITY)
Admission: AD | Admit: 2019-08-10 | Discharge: 2019-08-10 | Disposition: A | Discharge status: Home / Self Care | Payer: Medicaid Other | Attending: Family Medicine | Admitting: Family Medicine

## 2019-08-10 ENCOUNTER — Other Ambulatory Visit: Payer: Self-pay

## 2019-08-10 ENCOUNTER — Encounter (HOSPITAL_COMMUNITY): Payer: Self-pay | Admitting: Family Medicine

## 2019-08-10 ENCOUNTER — Telehealth: Payer: Self-pay | Admitting: *Deleted

## 2019-08-10 DIAGNOSIS — O219 Vomiting of pregnancy, unspecified: Secondary | ICD-10-CM | POA: Diagnosis not present

## 2019-08-10 DIAGNOSIS — K5900 Constipation, unspecified: Secondary | ICD-10-CM

## 2019-08-10 DIAGNOSIS — O26891 Other specified pregnancy related conditions, first trimester: Secondary | ICD-10-CM | POA: Diagnosis not present

## 2019-08-10 DIAGNOSIS — O210 Mild hyperemesis gravidarum: Secondary | ICD-10-CM | POA: Insufficient documentation

## 2019-08-10 DIAGNOSIS — O99611 Diseases of the digestive system complicating pregnancy, first trimester: Secondary | ICD-10-CM

## 2019-08-10 DIAGNOSIS — Z3A13 13 weeks gestation of pregnancy: Secondary | ICD-10-CM | POA: Insufficient documentation

## 2019-08-10 DIAGNOSIS — Z3400 Encounter for supervision of normal first pregnancy, unspecified trimester: Secondary | ICD-10-CM

## 2019-08-10 LAB — COMPREHENSIVE METABOLIC PANEL
ALT: 18 U/L (ref 0–44)
AST: 20 U/L (ref 15–41)
Albumin: 3.3 g/dL — ABNORMAL LOW (ref 3.5–5.0)
Alkaline Phosphatase: 51 U/L (ref 38–126)
Anion gap: 11 (ref 5–15)
BUN: <5 mg/dL — ABNORMAL LOW (ref 6–20)
CO2: 23 mmol/L (ref 22–32)
Calcium: 9.2 mg/dL (ref 8.9–10.3)
Chloride: 104 mmol/L (ref 98–111)
Creatinine, Ser: 0.67 mg/dL (ref 0.44–1.00)
GFR calc Af Amer: >60 mL/min (ref >60)
GFR calc non Af Amer: >60 mL/min (ref >60)
Glucose, Bld: 97 mg/dL (ref 70–99)
Potassium: 3.8 mmol/L (ref 3.5–5.1)
Sodium: 138 mmol/L (ref 135–145)
Total Bilirubin: 0.4 mg/dL (ref 0.3–1.2)
Total Protein: 6.7 g/dL (ref 6.5–8.1)

## 2019-08-10 LAB — CBC WITH DIFFERENTIAL/PLATELET
Abs Immature Granulocytes: 0.02 10*3/uL (ref 0.00–0.07)
Basophils Absolute: 0 10*3/uL (ref 0.0–0.1)
Basophils Relative: 0 %
Eosinophils Absolute: 0 10*3/uL (ref 0.0–0.5)
Eosinophils Relative: 0 %
HCT: 37.6 % (ref 36.0–46.0)
Hemoglobin: 12.3 g/dL (ref 12.0–15.0)
Immature Granulocytes: 0 %
Lymphocytes Relative: 26 %
Lymphs Abs: 2.7 10*3/uL (ref 0.7–4.0)
MCH: 25.8 pg — ABNORMAL LOW (ref 26.0–34.0)
MCHC: 32.7 g/dL (ref 30.0–36.0)
MCV: 78.8 fL — ABNORMAL LOW (ref 80.0–100.0)
Monocytes Absolute: 0.6 10*3/uL (ref 0.1–1.0)
Monocytes Relative: 6 %
Neutro Abs: 7 10*3/uL (ref 1.7–7.7)
Neutrophils Relative %: 68 %
Platelets: 227 10*3/uL (ref 150–400)
RBC: 4.77 MIL/uL (ref 3.87–5.11)
RDW: 16.1 % — ABNORMAL HIGH (ref 11.5–15.5)
WBC: 10.3 10*3/uL (ref 4.0–10.5)
nRBC: 0 % (ref 0.0–0.2)

## 2019-08-10 LAB — URINALYSIS, ROUTINE W REFLEX MICROSCOPIC
Bilirubin Urine: NEGATIVE
Glucose, UA: NEGATIVE mg/dL
Hgb urine dipstick: NEGATIVE
Ketones, ur: 20 mg/dL — AB
Nitrite: NEGATIVE
Protein, ur: 100 mg/dL — AB
Specific Gravity, Urine: 1.024 (ref 1.005–1.030)
pH: 7 (ref 5.0–8.0)

## 2019-08-10 MED ORDER — PANTOPRAZOLE SODIUM 40 MG IV SOLR
40.0000 mg | Freq: Once | INTRAVENOUS | Status: AC
  Administered 2019-08-10: 40 mg via INTRAVENOUS
  Filled 2019-08-10: qty 40

## 2019-08-10 MED ORDER — PANTOPRAZOLE SODIUM 20 MG PO TBEC: 20.0000 mg | DELAYED_RELEASE_TABLET | Freq: Every day | ORAL | 1 refills | Status: DC

## 2019-08-10 MED ORDER — SCOPOLAMINE 1 MG/3DAYS TD PT72: 1.0000 | MEDICATED_PATCH | TRANSDERMAL | 12 refills | Status: DC

## 2019-08-10 MED ORDER — SCOPOLAMINE 1 MG/3DAYS TD PT72
1.0000 | MEDICATED_PATCH | TRANSDERMAL | Status: DC
  Administered 2019-08-10: 1.5 mg via TRANSDERMAL
  Filled 2019-08-10: qty 1

## 2019-08-10 MED ORDER — LACTATED RINGERS IV BOLUS
1000.0000 mL | Freq: Once | INTRAVENOUS | Status: AC
  Administered 2019-08-10: 1000 mL via INTRAVENOUS

## 2019-08-10 MED ORDER — ONDANSETRON 8 MG PO TBDP: 8.0000 mg | ORAL_TABLET | Freq: Three times a day (TID) | ORAL | 3 refills | Status: AC | PRN

## 2019-08-10 MED ORDER — SODIUM CHLORIDE 0.9 % IV SOLN
8.0000 mg | Freq: Once | INTRAVENOUS | Status: AC
  Administered 2019-08-10: 8 mg via INTRAVENOUS
  Filled 2019-08-10: qty 4

## 2019-08-10 NOTE — Telephone Encounter (Signed)
Patient called requesting to be seen today for nausea and vomiting. Pt stated that the Phenergan does help, however she can only take it at night due to the drowsiness. The other medications listed does not give her any relief. Advised patient that there is no provider in clinic today and she could go to MAU for further evaluation.  Clovis Pu, RN

## 2019-08-10 NOTE — MAU Provider Note (Signed)
History     CSN: [id number]  Arrival date and time: 08/10/19 1507   First Provider Initiated Contact with Patient 08/10/19 1622      Chief Complaint  Patient presents with  . Abdominal Pain  . Emesis   Ms. Michele Bennie Pierinistrada Carney is a 19 y.o. G1P0 at 3662w2d who presents to MAU for nausea and vomiting after she called her OB to ask if there was anything they could do and was told they could not see her or prescribe anything additional over the phone and advised her to come to MAU for evaluation. Patient reports she used marijuana, but stopped when she found out she was pregnant, which was over a month ago. Pt denies any sick contacts at home and denies use of CBD.  Patient also reports constipation and had a bowel movement two days ago. Patient reports bowel movement every other day, which is helped by eating oranges.  Onset: 6 weeks of pregnancy Location: stomach Duration: 7weeks Character: constant nausea, vomits x5-8 times, is able to eat bagels, berries, goldfish, peanut butter and jelly and drink Aggravating/Associated: none/none Relieving: ice chips, peppermint Treatment: promethazine - works, but patient does not take during the day d/t side effects, Zofran ODT - stopped working  Pt denies VB, LOF, ctx, decreased FM, vaginal discharge/odor/itching. Pt denies abdominal pain, constipation, diarrhea, or urinary problems. Pt denies fever, chills, fatigue, sweating or changes in appetite. Pt denies SOB or chest pain. Pt denies dizziness, HA, light-headedness, weakness.  Problems this pregnancy include: N/Carney. Allergies? NKDA Current medications/supplements? PNVs, promethazine (last took Saturday), TUMS, Tylenol PRN Prenatal care provider? [facility], next appt 08/19/2019   OB History    Gravida  1   Para      Term      Preterm      AB      Living        SAB      TAB      Ectopic      Multiple      Live Births              Past Medical History:  Diagnosis  Date  . Medical history non-contributory     Past Surgical History:  Procedure Laterality Date  . NO PAST SURGERIES      Family History  Problem Relation Age of Onset  . Diabetes Maternal Grandmother   . Cancer Maternal Grandfather        colon    Social History   Tobacco Use  . Smoking status: Never Smoker  . Smokeless tobacco: Never Used  Substance Use Topics  . Alcohol use: Not Currently  . Drug use: Not Currently    Types: Marijuana    Allergies: No Known Allergies  Medications Prior to Admission  Medication Sig Dispense Refill Last Dose  . Blood Pressure Monitoring (BLOOD PRESSURE MONITOR AUTOMAT) DEVI 1 Device by Does not apply route daily. Automatic blood pressure cuff regular size. To monitor blood pressure regularly at home. ICD-10 code: O09.90 1 each 0 08/10/2019 at Unknown time  . Misc. Devices (GOJJI WEIGHT SCALE) MISC 1 Device by Does not apply route daily as needed. To weight self daily as needed at home. ICD-10 code: O09.90 1 each 0 08/10/2019 at Unknown time  . ondansetron (ZOFRAN ODT) 8 MG disintegrating tablet Take 1 tablet (8 mg total) by mouth every 8 (eight) hours as needed for nausea or vomiting. 20 tablet 3 Past Week at Unknown time  . Prenatal Vit-Fe  Fumarate-FA (PRENATAL MULTIVITAMIN) TABS tablet Take 1 tablet by mouth daily at 12 noon.   08/10/2019 at Unknown time  . promethazine (PHENERGAN) 25 MG tablet Take 0.5-1 tablets (12.5-25 mg total) by mouth every 6 (six) hours as needed for nausea or vomiting. 30 tablet 5 Past Week at Unknown time  . cephALEXin (KEFLEX) 500 MG capsule Take 1 capsule (500 mg total) by mouth 3 (three) times daily. 21 capsule 0   . famotidine (PEPCID) 20 MG tablet Take 1 tablet (20 mg total) by mouth 2 (two) times daily. 30 tablet 0   . metoCLOPramide (REGLAN) 10 MG tablet Take 1 tablet (10 mg total) by mouth every 6 (six) hours. 30 tablet 0     Review of Systems  Constitutional: Negative for chills, diaphoresis, fatigue and  fever.  Eyes: Negative for visual disturbance.  Respiratory: Negative for shortness of breath.   Cardiovascular: Negative for chest pain.  Gastrointestinal: Positive for constipation, nausea and vomiting. Negative for abdominal pain and diarrhea.  Genitourinary: Negative for dysuria, flank pain, frequency, pelvic pain, urgency, vaginal bleeding and vaginal discharge.  Neurological: Negative for dizziness, weakness, light-headedness and headaches.   Physical Exam   Blood pressure (!) 106/55, pulse (!) 110, temperature 99.1 F (37.3 C), temperature source Oral, resp. rate 18, height 5\' 1"  (1.549 m), weight 80.3 kg, last menstrual period 04/23/2019, SpO2 100 %.  Patient Vitals for the past 24 hrs:  BP Temp Temp src Pulse Resp SpO2 Height Weight  08/10/19 1538 (!) 106/55 99.1 F (37.3 C) Oral (!) 110 18 100 % 5\' 1"  (1.549 m) 80.3 kg   Physical Exam  Constitutional: She is oriented to person, place, and time. She appears well-developed and well-nourished. No distress.  HENT:  Head: Normocephalic and atraumatic.  Respiratory: Effort normal.  GI: Soft. She exhibits no distension and no mass. There is no abdominal tenderness. There is no rebound and no guarding.  Genitourinary:    No vaginal discharge.   Neurological: She is alert and oriented to person, place, and time.  Skin: Skin is warm and dry. She is not diaphoretic.  Psychiatric: She has a normal mood and affect. Her behavior is normal. Judgment and thought content normal.  FHR 154   Results for orders placed or performed during the hospital encounter of 08/10/19 (from the past 24 hour(s))  Urinalysis, Routine w reflex microscopic     Status: Abnormal   Collection Time: 08/10/19  3:24 PM  Result Value Ref Range   Color, Urine YELLOW YELLOW   APPearance HAZY (A) CLEAR   Specific Gravity, Urine 1.024 1.005 - 1.030   pH 7.0 5.0 - 8.0   Glucose, UA NEGATIVE NEGATIVE mg/dL   Hgb urine dipstick NEGATIVE NEGATIVE   Bilirubin Urine  NEGATIVE NEGATIVE   Ketones, ur 20 (A) NEGATIVE mg/dL   Protein, ur 08/12/19 (A) NEGATIVE mg/dL   Nitrite NEGATIVE NEGATIVE   Leukocytes,Ua TRACE (A) NEGATIVE   RBC / HPF 0-5 0 - 5 RBC/hpf   WBC, UA 0-5 0 - 5 WBC/hpf   Bacteria, UA RARE (A) NONE SEEN   Squamous Epithelial / LPF 0-5 0 - 5   Mucus PRESENT    Hyaline Casts, UA PRESENT    Amorphous Crystal PRESENT   CBC with Differential/Platelet     Status: Abnormal   Collection Time: 08/10/19  5:05 PM  Result Value Ref Range   WBC 10.3 4.0 - 10.5 K/uL   RBC 4.77 3.87 - 5.11 MIL/uL   Hemoglobin 12.3  12.0 - 15.0 g/dL   HCT 64.4 03.4 - 74.2 %   MCV 78.8 (L) 80.0 - 100.0 fL   MCH 25.8 (L) 26.0 - 34.0 pg   MCHC 32.7 30.0 - 36.0 g/dL   RDW 59.5 (H) 63.8 - 75.6 %   Platelets 227 150 - 400 K/uL   nRBC 0.0 0.0 - 0.2 %   Neutrophils Relative % 68 %   Neutro Abs 7.0 1.7 - 7.7 K/uL   Lymphocytes Relative 26 %   Lymphs Abs 2.7 0.7 - 4.0 K/uL   Monocytes Relative 6 %   Monocytes Absolute 0.6 0.1 - 1.0 K/uL   Eosinophils Relative 0 %   Eosinophils Absolute 0.0 0.0 - 0.5 K/uL   Basophils Relative 0 %   Basophils Absolute 0.0 0.0 - 0.1 K/uL   Immature Granulocytes 0 %   Abs Immature Granulocytes 0.02 0.00 - 0.07 K/uL  Comprehensive metabolic panel     Status: Abnormal   Collection Time: 08/10/19  5:05 PM  Result Value Ref Range   Sodium 138 135 - 145 mmol/L   Potassium 3.8 3.5 - 5.1 mmol/L   Chloride 104 98 - 111 mmol/L   CO2 23 22 - 32 mmol/L   Glucose, Bld 97 70 - 99 mg/dL   BUN <5 (L) 6 - 20 mg/dL   Creatinine, Ser 4.33 0.44 - 1.00 mg/dL   Calcium 9.2 8.9 - 29.5 mg/dL   Total Protein 6.7 6.5 - 8.1 g/dL   Albumin 3.3 (L) 3.5 - 5.0 g/dL   AST 20 15 - 41 U/L   ALT 18 0 - 44 U/L   Alkaline Phosphatase 51 38 - 126 U/L   Total Bilirubin 0.4 0.3 - 1.2 mg/dL   GFR calc non Af Amer >60 >60 mL/min   GFR calc Af Amer >60 >60 mL/min   Anion gap 11 5 - 15    MAU Course  Procedures  MDM -N/Carney in pregnancy + constipation -UA:  hazy/20ketones/100PRO/trace leuks/rare bacteria, urine sent for culture for TOC -CBC w/ Diff: WNL for pregnancy -CMP: WNL for pregnancy -1L LR + 8mg  Zofran + 40mg  Protonix + Scopolamine patch, pt reports N/Carney now resolved -PO challenge successful -pt discharged to home in stable condition  Orders Placed This Encounter  Procedures  . Culture, OB Urine    Standing Status:   Standing    Number of Occurrences:   1  . Urinalysis, Routine w reflex microscopic    Standing Status:   Standing    Number of Occurrences:   1  . CBC with Differential/Platelet    Standing Status:   Standing    Number of Occurrences:   1  . Comprehensive metabolic panel    Standing Status:   Standing    Number of Occurrences:   1  . Insert peripheral IV    Standing Status:   Standing    Number of Occurrences:   1  . Discharge patient    Order Specific Question:   Discharge disposition    Answer:   01-Home or Self Care [1]    Order Specific Question:   Discharge patient date    Answer:   08/10/2019   Meds ordered this encounter  Medications  . lactated ringers bolus 1,000 mL  . ondansetron (ZOFRAN) 8 mg in sodium chloride 0.9 % 50 mL IVPB  . pantoprazole (PROTONIX) injection 40 mg  . scopolamine (TRANSDERM-SCOP) 1 MG/3DAYS 1.5 mg  . scopolamine (TRANSDERM-SCOP) 1 MG/3DAYS  Sig: Place 1 patch (1.5 mg total) onto the skin every 3 (three) days.    Dispense:  10 patch    Refill:  12    Order Specific Question:   Supervising Provider    Answer:   CONSTANT, PEGGY [[id number]]  . ondansetron (ZOFRAN ODT) 8 MG disintegrating tablet    Sig: Take 1 tablet (8 mg total) by mouth every 8 (eight) hours as needed for up to 7 days for nausea or vomiting.    Dispense:  21 tablet    Refill:  3    Order Specific Question:   Supervising Provider    Answer:   CONSTANT, PEGGY [[id number]]  . pantoprazole (PROTONIX) 20 MG tablet    Sig: Take 1 tablet (20 mg total) by mouth daily.    Dispense:  30 tablet    Refill:  1    Order  Specific Question:   Supervising Provider    Answer:   CONSTANT, PEGGY [[id number]]    Assessment and Plan   1. Nausea and vomiting in pregnancy   2. Supervision of normal first pregnancy, antepartum   3. [redacted] weeks gestation of pregnancy   4. Constipation during pregnancy in first trimester    Allergies as of 08/10/2019   No Known Allergies     Medication List    STOP taking these medications   famotidine 20 MG tablet Commonly known as: PEPCID   metoCLOPramide 10 MG tablet Commonly known as: REGLAN     TAKE these medications   Blood Pressure Monitor Automat Devi 1 Device by Does not apply route daily. Automatic blood pressure cuff regular size. To monitor blood pressure regularly at home. ICD-10 code: O09.90   cephALEXin 500 MG capsule Commonly known as: KEFLEX Take 1 capsule (500 mg total) by mouth 3 (three) times daily.   Gojji Weight Scale Misc 1 Device by Does not apply route daily as needed. To weight self daily as needed at home. ICD-10 code: O09.90   ondansetron 8 MG disintegrating tablet Commonly known as: Zofran ODT Take 1 tablet (8 mg total) by mouth every 8 (eight) hours as needed for nausea or vomiting. What changed: Another medication with the same name was added. Make sure you understand how and when to take each.   ondansetron 8 MG disintegrating tablet Commonly known as: Zofran ODT Take 1 tablet (8 mg total) by mouth every 8 (eight) hours as needed for up to 7 days for nausea or vomiting. What changed: You were already taking a medication with the same name, and this prescription was added. Make sure you understand how and when to take each.   pantoprazole 20 MG tablet Commonly known as: Protonix Take 1 tablet (20 mg total) by mouth daily.   prenatal multivitamin Tabs tablet Take 1 tablet by mouth daily at 12 noon.   promethazine 25 MG tablet Commonly known as: PHENERGAN Take 0.5-1 tablets (12.5-25 mg total) by mouth every 6 (six) hours as needed for  nausea or vomiting.   scopolamine 1 MG/3DAYS Commonly known as: TRANSDERM-SCOP Place 1 patch (1.5 mg total) onto the skin every 3 (three) days. Start taking on: August 13, 2019      -will call with culture results, if postive -medication reconciliation performed for patient, patient advised to only take Zofran, Scopolamine and Protonix at this time for N/Carney -discussed treatment of constipation in pregnancy including fiber-rich foods, exercise, increased water intake, safe medications in pregnancy -safe meds in pregnancy list given with focus on medications  for constipation, advised Colace daily with increased water intake -pt advised to take medications around the clock and not to stop taking if feeling better -discussed nonpharmacologic and pharmacologic treatments of N/Carney -discussed normal expectations for N/Carney in pregnancy -return MAU precautions given -pt discharged to home in stable condition  Michele Carney Michele Carney 08/10/2019, 6:29 PM

## 2019-08-10 NOTE — Discharge Instructions (Signed)
Hyperemesis Gravidarum Hyperemesis gravidarum is a severe form of nausea and vomiting that happens during pregnancy. Hyperemesis is worse than morning sickness. It may cause you to have nausea or vomiting all day for many days. It may keep you from eating and drinking enough food and liquids, which can lead to dehydration, malnutrition, and weight loss. Hyperemesis usually occurs during the first half (the first 20 weeks) of pregnancy. It often goes away once a woman is in her second half of pregnancy. However, sometimes hyperemesis continues through an entire pregnancy. What are the causes? The cause of this condition is not known. It may be related to changes in chemicals (hormones) in the body during pregnancy, such as the high level of pregnancy hormone (human chorionic gonadotropin) or the increase in the female sex hormone (estrogen). What are the signs or symptoms? Symptoms of this condition include:  Nausea that does not go away.  Vomiting that does not allow you to keep any food down.  Weight loss.  Body fluid loss (dehydration).  Having no desire to eat, or not liking food that you have previously enjoyed. How is this diagnosed? This condition may be diagnosed based on:  A physical exam.  Your medical history.  Your symptoms.  Blood tests.  Urine tests. How is this treated? This condition is managed by controlling symptoms. This may include:  Following an eating plan. This can help lessen nausea and vomiting.  Taking prescription medicines. An eating plan and medicines are often used together to help control symptoms. If medicines do not help relieve nausea and vomiting, you may need to receive fluids through an IV at the hospital. Follow these instructions at home: Eating and drinking   Avoid the following: ? Drinking fluids with meals. Try not to drink anything during the 30 minutes before and after your meals. ? Drinking more than 1 cup of fluid at a  time. ? Eating foods that trigger your symptoms. These may include spicy foods, coffee, high-fat foods, very sweet foods, and acidic foods. ? Skipping meals. Nausea can be more intense on an empty stomach. If you cannot tolerate food, do not force it. Try sucking on ice chips or other frozen items and make up for missed calories later. ? Lying down within 2 hours after eating. ? Being exposed to environmental triggers. These may include food smells, smoky rooms, closed spaces, rooms with strong smells, warm or humid places, overly loud and noisy rooms, and rooms with motion or flickering lights. Try eating meals in a well-ventilated area that is free of strong smells. ? Quick and sudden changes in your movement. ? Taking iron pills and multivitamins that contain iron. If you take prescription iron pills, do not stop taking them unless your health care provider approves. ? Preparing food. The smell of food can spoil your appetite or trigger nausea.  To help relieve your symptoms: ? Listen to your body. Everyone is different and has different preferences. Find what works best for you. ? Eat and drink slowly. ? Eat 5-6 small meals daily instead of 3 large meals. Eating small meals and snacks can help you avoid an empty stomach. ? In the morning, before getting out of bed, eat a couple of crackers to avoid moving around on an empty stomach. ? Try eating starchy foods as these are usually tolerated well. Examples include cereal, toast, bread, potatoes, pasta, rice, and pretzels. ? Include at least 1 serving of protein with your meals and snacks. Protein options include   lean meats, poultry, seafood, beans, nuts, nut butters, eggs, cheese, and yogurt. ? Try eating a protein-rich snack before bed. Examples of a protein-rick snack include cheese and crackers or a peanut butter sandwich made with 1 slice of whole-wheat bread and 1 tsp (5 g) of peanut butter. ? Eat or suck on things that have ginger in them.  It may help relieve nausea. Add  tsp ground ginger to hot tea or choose ginger tea. ? Try drinking 100% fruit juice or an electrolyte drink. An electrolyte drink contains sodium, potassium, and chloride. ? Drink fluids that are cold, clear, and carbonated or sour. Examples include lemonade, ginger ale, lemon-lime soda, ice water, and sparkling water. ? Brush your teeth or use a mouth rinse after meals. ? Talk with your health care provider about starting a supplement of vitamin B6. General instructions  Take over-the-counter and prescription medicines only as told by your health care provider.  Follow instructions from your health care provider about eating or drinking restrictions.  Continue to take your prenatal vitamins as told by your health care provider. If you are having trouble taking your prenatal vitamins, talk with your health care provider about different options.  Keep all follow-up and pre-birth (prenatal) visits as told by your health care provider. This is important. Contact a health care provider if:  You have pain in your abdomen.  You have a severe headache.  You have vision problems.  You are losing weight.  You feel weak or dizzy. Get help right away if:  You cannot drink fluids without vomiting.  You vomit blood.  You have constant nausea and vomiting.  You are very weak.  You faint.  You have a fever and your symptoms suddenly get worse. Summary  Hyperemesis gravidarum is a severe form of nausea and vomiting that happens during pregnancy.  Making some changes to your eating habits may help relieve nausea and vomiting.  This condition may be managed with medicine.  If medicines do not help relieve nausea and vomiting, you may need to receive fluids through an IV at the hospital. This information is not intended to replace advice given to you by your health care provider. Make sure you discuss any questions you have with your health care  provider. Document Revised: 05/26/2017 Document Reviewed: 01/03/2016 Elsevier Patient Education  [place]. Morning Sickness  Morning sickness is when a woman feels nauseous during pregnancy. This nauseous feeling may or may not come with vomiting. It often occurs in the morning, but it can be a problem at any time of day. Morning sickness is most common during the first trimester. In some cases, it may continue throughout pregnancy. Although morning sickness is unpleasant, it is usually harmless unless the woman develops severe and continual vomiting (hyperemesis gravidarum), a condition that requires more intense treatment. What are the causes? The exact cause of this condition is not known, but it seems to be related to normal hormonal changes that occur in pregnancy. What increases the risk? You are more likely to develop this condition if:  You experienced nausea or vomiting before your pregnancy.  You had morning sickness during a previous pregnancy.  You are pregnant with more than one baby, such as twins. What are the signs or symptoms? Symptoms of this condition include:  Nausea.  Vomiting. How is this diagnosed? This condition is usually diagnosed based on your signs and symptoms. How is this treated? In many cases, treatment is not needed for this condition.  Making some changes to what you eat may help to control symptoms. Your health care provider may also prescribe or recommend:  Vitamin B6 supplements.  Anti-nausea medicines.  Ginger. Follow these instructions at home: Medicines  Take over-the-counter and prescription medicines only as told by your health care provider. Do not use any prescription, over-the-counter, or herbal medicines for morning sickness without first talking with your health care provider.  Taking multivitamins before getting pregnant can prevent or decrease the severity of morning sickness in most women. Eating and drinking  Eat a  piece of dry toast or crackers before getting out of bed in the morning.  Eat 5 or 6 small meals a day.  Eat dry and bland foods, such as rice or a baked potato. Foods that are high in carbohydrates are often helpful.  Avoid greasy, fatty, and spicy foods.  Have someone cook for you if the smell of any food causes nausea and vomiting.  If you feel nauseous after taking prenatal vitamins, take the vitamins at night or with a snack.  Snack on protein foods between meals if you are hungry. Nuts, yogurt, and cheese are good options.  Drink fluids throughout the day.  Try ginger ale made with real ginger, ginger tea made from fresh grated ginger, or ginger candies. General instructions  Do not use any products that contain nicotine or tobacco, such as cigarettes and e-cigarettes. If you need help quitting, ask your health care provider.  Get an air purifier to keep the air in your house free of odors.  Get plenty of fresh air.  Try to avoid odors that trigger your nausea.  Consider trying these methods to help relieve symptoms: ? Wearing an acupressure wristband. These wristbands are often worn for seasickness. ? Acupuncture. Contact a health care provider if:  Your home remedies are not working and you need medicine.  You feel dizzy or light-headed.  You are losing weight. Get help right away if:  You have persistent and uncontrolled nausea and vomiting.  You faint.  You have severe pain in your abdomen. Summary  Morning sickness is when a woman feels nauseous during pregnancy. This nauseous feeling may or may not come with vomiting.  Morning sickness is most common during the first trimester.  It often occurs in the morning, but it can be a problem at any time of day.  In many cases, treatment is not needed for this condition. Making some changes to what you eat may help to control symptoms. This information is not intended to replace advice given to you by your  health care provider. Make sure you discuss any questions you have with your health care provider. Document Revised: 04/18/2017 Document Reviewed: 06/08/2016 Elsevier Patient Education  2020 [employer]                   Safe Medications in Pregnancy    Acne: Benzoyl Peroxide Salicylic Acid  Backache/Headache: Tylenol: 2 regular strength every 4 hours OR              2 Extra strength every 6 hours  Colds/Coughs/Allergies: Benadryl (alcohol free) 25 mg every 6 hours as needed Breath right strips Claritin Cepacol throat lozenges Chloraseptic throat spray Cold-Eeze- up to three times per day Cough drops, alcohol free Flonase (by prescription only) Guaifenesin Mucinex Robitussin DM (plain only, alcohol free) Saline nasal spray/drops Sudafed (pseudoephedrine) & Actifed ** use only after [redacted] weeks gestation and if you do not have high blood pressure   Tylenol Vicks Vaporub Zinc lozenges Zyrtec   Constipation: Colace Ducolax suppositories Fleet enema Glycerin suppositories Metamucil Milk of magnesia Miralax Senokot Smooth move tea  Diarrhea: Kaopectate Imodium A-D  *NO pepto Bismol  Hemorrhoids: Anusol Anusol HC Preparation H Tucks  Indigestion: Tums Maalox Mylanta Zantac  Pepcid  Insomnia: Benadryl (alcohol free) 25mg every 6 hours as needed Tylenol PM Unisom, no Gelcaps  Leg Cramps: Tums MagGel  Nausea/Vomiting:  Bonine Dramamine Emetrol Ginger extract Sea bands Meclizine  Nausea medication to take during pregnancy:  Unisom (doxylamine succinate 25 mg tablets) Take one tablet daily at bedtime. If symptoms are not adequately controlled, the dose can be increased to a maximum recommended dose of two tablets daily (1/2 tablet in the morning, 1/2 tablet mid-afternoon and one at bedtime). Vitamin B6 100mg tablets. Take one tablet twice a day (up to 200 mg per day).  Skin Rashes: Aveeno products Benadryl cream or 25mg every 6 hours as  needed Calamine Lotion 1% cortisone cream  Yeast infection: Gyne-lotrimin 7 Monistat 7   **If taking multiple medications, please check labels to avoid duplicating the same active ingredients **take medication as directed on the label ** Do not exceed 4000 mg of tylenol in 24 hours **Do not take medications that contain aspirin or ibuprofen 

## 2019-08-10 NOTE — [facility]U Note (Signed)
Pt has been having increased nausea and vomiting since Saturday. Was taking Promethazine but it makes her sleepy and she works.  Denies VB, LOF, but has cramps for 2 days.

## 2019-08-11 LAB — CULTURE, OB URINE: Culture: NO GROWTH

## 2019-08-13 ENCOUNTER — Other Ambulatory Visit: Payer: Medicaid Other

## 2019-08-18 NOTE — Progress Notes (Signed)
LOW-RISK PREGNANCY OFFICE VISIT Patient name: Michele Carney MRN [id number]  Date of birth: 01-Aug-2000 Chief Complaint:   Routine Prenatal Visit, Vaginal Itching, and Burning with Urination  History of Present Illness:   Michele Carney is a 19 y.o. G1P0 female at [redacted]w[redacted]d with an Estimated Date of Delivery: 02/13/20 being seen today for ongoing management of a low-risk pregnancy.  Today she reports headache, nausea, vaginal irritation, cramping sometimes after she eats, dizziness that is now more consisten and burning with urination. Contractions: Not present. Vag. Bleeding: None.  Movement: Absent. denies leaking of fluid. Review of Systems:   Pertinent items are noted in HPI Denies abnormal vaginal discharge w/ itching/odor/irritation, headaches, visual changes, shortness of breath, chest pain, abdominal pain, severe nausea/vomiting, or problems with urination or bowel movements unless otherwise stated above. Pertinent History Reviewed:  Reviewed past medical,surgical, social, obstetrical and family history.  Reviewed problem list, medications and allergies. Physical Assessment:   Vitals:   08/19/19 1439  BP: 122/77  Pulse: 94  Temp: 97.9 F (36.6 C)  Weight: 178 lb 9.6 oz (81 kg)  Body mass index is 33.75 kg/m.        Physical Examination:   General appearance: Well appearing, and in no distress  Mental status: Alert, oriented to person, place, and time  Skin: Warm & dry  Cardiovascular: Normal heart rate noted  Respiratory: Normal respiratory effort, no distress  Abdomen: Soft, gravid, nontender  Pelvic: Cervical exam deferred         Extremities: Edema: None  Fetal Status: Fetal Heart Rate (bpm): 151   Movement: Absent    No results found for this or any previous visit (from the past 24 hour(s)).  Assessment & Plan:  1) Low-risk pregnancy G1P0 at [redacted]w[redacted]d with an Estimated Date of Delivery: 02/13/20   2) Supervision of normal first pregnancy, antepartum - Discussed  cramping after eating could be GI upset from something she ate - Advised that if like period cramps and causing her to double over in pain, seek care at [facility]U  3) Nausea and vomiting in pregnancy  - Rx for scopolamine (TRANSDERM-SCOP, 1.5 MG,) 1 MG/3DAYS  4) Burning with urination  - Culture, OB Urine,  - POC Urinalysis Dipstick OB  5) Vaginal itching  - Cervicovaginal ancillary only( [place])  6) Nonintractable episodic headache, unspecified headache type  - Rx for cyclobenzaprine (FLEXERIL) 10 MG tablet - Information provided on migraine headache and flexeril    Meds:  Meds ordered this encounter  Medications  . cyclobenzaprine (FLEXERIL) 10 MG tablet    Sig: Take 1 tablet (10 mg total) by mouth every 8 (eight) hours as needed for muscle spasms.    Dispense:  30 tablet    Refill:  1    Order Specific Question:   Supervising Provider    Answer:   Reva Bores [[id number]]  . scopolamine (TRANSDERM-SCOP, 1.5 MG,) 1 MG/3DAYS    Sig: Place 1 patch (1.5 mg total) onto the skin every 3 (three) days.    Dispense:  4 patch    Refill:  0    Order Specific Question:   Supervising Provider    Answer:   Reva Bores [[id number]]   Labs/procedures today: CCUA, UCx, wet Prep  Plan:  Continue routine obstetrical care   Reviewed: Preterm labor symptoms and general obstetric precautions including but not limited to vaginal bleeding, contractions, leaking of fluid and fetal movement were reviewed in detail with the patient.  All  questions were answered. Has home bp cuff. Check bp weekly, let us know if >140/90.   Follow-up: Return in about 6 weeks (around 09/30/2019) for Return OB - My Chart video.  Orders Placed This Encounter  Procedures  . Culture, OB Urine  . POC Urinalysis Dipstick OB   Laury Deep MSN, CNM 08/19/2019

## 2019-08-19 ENCOUNTER — Other Ambulatory Visit (HOSPITAL_COMMUNITY)
Admission: RE | Admit: 2019-08-19 | Discharge: 2019-08-19 | Disposition: A | Discharge status: Home / Self Care | Payer: Medicaid Other | Source: Ambulatory Visit | Attending: Obstetrics and Gynecology | Admitting: Obstetrics and Gynecology

## 2019-08-19 ENCOUNTER — Ambulatory Visit (INDEPENDENT_AMBULATORY_CARE_PROVIDER_SITE_OTHER): Payer: Medicaid Other | Admitting: Obstetrics and Gynecology

## 2019-08-19 ENCOUNTER — Other Ambulatory Visit: Payer: Self-pay

## 2019-08-19 VITALS — BP 122/77 | HR 94 | Temp 97.9°F | Wt 178.6 lb

## 2019-08-19 DIAGNOSIS — N898 Other specified noninflammatory disorders of vagina: Secondary | ICD-10-CM | POA: Insufficient documentation

## 2019-08-19 DIAGNOSIS — Z3A14 14 weeks gestation of pregnancy: Secondary | ICD-10-CM

## 2019-08-19 DIAGNOSIS — Z3400 Encounter for supervision of normal first pregnancy, unspecified trimester: Secondary | ICD-10-CM

## 2019-08-19 DIAGNOSIS — O219 Vomiting of pregnancy, unspecified: Secondary | ICD-10-CM

## 2019-08-19 DIAGNOSIS — R519 Headache, unspecified: Secondary | ICD-10-CM

## 2019-08-19 DIAGNOSIS — R300 Dysuria: Secondary | ICD-10-CM

## 2019-08-19 LAB — POCT URINALYSIS DIPSTICK OB
Bilirubin, UA: NEGATIVE
Blood, UA: NEGATIVE
Glucose, UA: NEGATIVE
Ketones, UA: NEGATIVE
Leukocytes, UA: NEGATIVE
Nitrite, UA: NEGATIVE
POC,PROTEIN,UA: NEGATIVE
Spec Grav, UA: >=1.03 — AB (ref 1.010–1.025)
Urobilinogen, UA: 0.2 E.U./dL
pH, UA: 7 (ref 5.0–8.0)

## 2019-08-19 MED ORDER — CYCLOBENZAPRINE HCL 10 MG PO TABS: 10.0000 mg | ORAL_TABLET | Freq: Three times a day (TID) | ORAL | 1 refills | Status: DC | PRN

## 2019-08-19 MED ORDER — SCOPOLAMINE 1 MG/3DAYS TD PT72: 1.0000 | MEDICATED_PATCH | TRANSDERMAL | 0 refills | Status: DC

## 2019-08-19 NOTE — Patient Instructions (Signed)
Migraine Headache A migraine headache is an intense, throbbing pain on one side or both sides of the head. Migraine headaches may also cause other symptoms, such as nausea, vomiting, and sensitivity to light and noise. A migraine headache can last from 4 hours to 3 days. Talk with your doctor about what things may bring on (trigger) your migraine headaches. What are the causes? The exact cause of this condition is not known. However, a migraine may be caused when nerves in the brain become irritated and release chemicals that cause inflammation of blood vessels. This inflammation causes pain. This condition may be triggered or caused by:  Drinking alcohol.  Smoking.  Taking medicines, such as: ? Medicine used to treat chest pain (nitroglycerin). ? Birth control pills. ? Estrogen. ? Certain blood pressure medicines.  Eating or drinking products that contain nitrates, glutamate, aspartame, or tyramine. Aged cheeses, chocolate, or caffeine may also be triggers.  Doing physical activity. Other things that may trigger a migraine headache include:  Menstruation.  Pregnancy.  Hunger.  Stress.  Lack of sleep or too much sleep.  Weather changes.  Fatigue. What increases the risk? The following factors may make you more likely to experience migraine headaches:  Being a certain age. This condition is more common in people who are 25-55 years old.  Being female.  Having a family history of migraine headaches.  Being Caucasian.  Having a mental health condition, such as depression or anxiety.  Being obese. What are the signs or symptoms? The main symptom of this condition is pulsating or throbbing pain. This pain may:  Happen in any area of the head, such as on one side or both sides.  Interfere with daily activities.  Get worse with physical activity.  Get worse with exposure to bright lights or loud noises. Other symptoms may  include:  Nausea.  Vomiting.  Dizziness.  General sensitivity to bright lights, loud noises, or smells. Before you get a migraine headache, you may get warning signs (an aura). An aura may include:  Seeing flashing lights or having blind spots.  Seeing bright spots, halos, or zigzag lines.  Having tunnel vision or blurred vision.  Having numbness or a tingling feeling.  Having trouble talking.  Having muscle weakness. Some people have symptoms after a migraine headache (postdromal phase), such as:  Feeling tired.  Difficulty concentrating. How is this diagnosed? A migraine headache can be diagnosed based on:  Your symptoms.  A physical exam.  Tests, such as: ? CT scan or an MRI of the head. These imaging tests can help rule out other causes of headaches. ? Taking fluid from the spine (lumbar puncture) and analyzing it (cerebrospinal fluid analysis, or CSF analysis). How is this treated? This condition may be treated with medicines that:  Relieve pain.  Relieve nausea.  Prevent migraine headaches. Treatment for this condition may also include:  Acupuncture.  Lifestyle changes like avoiding foods that trigger migraine headaches.  Biofeedback.  Cognitive behavioral therapy. Follow these instructions at home: Medicines  Take over-the-counter and prescription medicines only as told by your health care provider.  Ask your health care provider if the medicine prescribed to you: ? Requires you to avoid driving or using heavy machinery. ? Can cause constipation. You may need to take these actions to prevent or treat constipation:  Drink enough fluid to keep your urine pale yellow.  Take over-the-counter or prescription medicines.  Eat foods that are high in fiber, such as beans, whole grains, and   fresh fruits and vegetables.  Limit foods that are high in fat and processed sugars, such as fried or sweet foods. Lifestyle  Do not drink alcohol.  Do not  use any products that contain nicotine or tobacco, such as cigarettes, e-cigarettes, and chewing tobacco. If you need help quitting, ask your health care provider.  Get at least 8 hours of sleep every night.  Find ways to manage stress, such as meditation, deep breathing, or yoga. General instructions      Keep a journal to find out what may trigger your migraine headaches. For example, write down: ? What you eat and drink. ? How much sleep you get. ? Any change to your diet or medicines.  If you have a migraine headache: ? Avoid things that make your symptoms worse, such as bright lights. ? It may help to lie down in a dark, quiet room. ? Do not drive or use heavy machinery. ? Ask your health care provider what activities are safe for you while you are experiencing symptoms.  Keep all follow-up visits as told by your health care provider. This is important. Contact a health care provider if:  You develop symptoms that are different or more severe than your usual migraine headache symptoms.  You have more than 15 headache days in one month. Get help right away if:  Your migraine headache becomes severe.  Your migraine headache lasts longer than 72 hours.  You have a fever.  You have a stiff neck.  You have vision loss.  Your muscles feel weak or like you cannot control them.  You start to lose your balance often.  You have trouble walking.  You faint.  You have a seizure. Summary  A migraine headache is an intense, throbbing pain on one side or both sides of the head. Migraines may also cause other symptoms, such as nausea, vomiting, and sensitivity to light and noise.  This condition may be treated with medicines and lifestyle changes. You may also need to avoid certain things that trigger a migraine headache.  Keep a journal to find out what may trigger your migraine headaches.  Contact your health care provider if you have more than 15 headache days in a  month or you develop symptoms that are different or more severe than your usual migraine headache symptoms. This information is not intended to replace advice given to you by your health care provider. Make sure you discuss any questions you have with your health care provider. Document Revised: 08/28/2018 Document Reviewed: 06/18/2018 Elsevier Patient Education  2020 Elsevier Inc.  Cyclobenzaprine tablets What is this medicine? CYCLOBENZAPRINE (sye kloe BEN za preen) is a muscle relaxer. It is used to treat muscle pain, spasms, and stiffness. This medicine may be used for other purposes; ask your health care provider or pharmacist if you have questions. COMMON BRAND NAME(S): Fexmid, Flexeril What should I tell my health care provider before I take this medicine? They need to know if you have any of these conditions:  heart disease, irregular heartbeat, or previous heart attack  liver disease  thyroid problem  an unusual or allergic reaction to cyclobenzaprine, tricyclic antidepressants, lactose, other medicines, foods, dyes, or preservatives  pregnant or trying to get pregnant  breast-feeding How should I use this medicine? Take this medicine by mouth with a glass of water. Follow the directions on the prescription label. If this medicine upsets your stomach, take it with food or milk. Take your medicine at regular intervals. Do  not take it more often than directed. Talk to your pediatrician regarding the use of this medicine in children. Special care may be needed. Overdosage: If you think you have taken too much of this medicine contact a poison control center or emergency room at once. NOTE: This medicine is only for you. Do not share this medicine with others. What if I miss a dose? If you miss a dose, take it as soon as you can. If it is almost time for your next dose, take only that dose. Do not take double or extra doses. What may interact with this medicine? Do not take this  medicine with any of the following medications:  MAOIs like Carbex, Eldepryl, Marplan, Nardil, and Parnate  narcotic medicines for cough  safinamide This medicine may also interact with the following medications:  alcohol  bupropion  antihistamines for allergy, cough and cold  certain medicines for anxiety or sleep  certain medicines for bladder problems like oxybutynin, tolterodine  certain medicines for depression like amitriptyline, fluoxetine, sertraline  certain medicines for Parkinson's disease like benztropine, trihexyphenidyl  certain medicines for seizures like phenobarbital, primidone  certain medicines for stomach problems like dicyclomine, hyoscyamine  certain medicines for travel sickness like scopolamine  general anesthetics like halothane, isoflurane, methoxyflurane, propofol  ipratropium  local anesthetics like lidocaine, pramoxine, tetracaine  medicines that relax muscles for surgery  narcotic medicines for pain  phenothiazines like chlorpromazine, mesoridazine, prochlorperazine, thioridazine  verapamil This list may not describe all possible interactions. Give your health care provider a list of all the medicines, herbs, non-prescription drugs, or dietary supplements you use. Also tell them if you smoke, drink alcohol, or use illegal drugs. Some items may interact with your medicine. What should I watch for while using this medicine? Tell your doctor or health care professional if your symptoms do not start to get better or if they get worse. You may get drowsy or dizzy. Do not drive, use machinery, or do anything that needs mental alertness until you know how this medicine affects you. Do not stand or sit up quickly, especially if you are an older patient. This reduces the risk of dizzy or fainting spells. Alcohol may interfere with the effect of this medicine. Avoid alcoholic drinks. If you are taking another medicine that also causes drowsiness, you  may have more side effects. Give your health care provider a list of all medicines you use. Your doctor will tell you how much medicine to take. Do not take more medicine than directed. Call emergency for help if you have problems breathing or unusual sleepiness. Your mouth may get dry. Chewing sugarless gum or sucking hard candy, and drinking plenty of water may help. Contact your doctor if the problem does not go away or is severe. What side effects may I notice from receiving this medicine? Side effects that you should report to your doctor or health care professional as soon as possible:  allergic reactions like skin rash, itching or hives, swelling of the face, lips, or tongue  breathing problems  chest pain  fast, irregular heartbeat  hallucinations  seizures  unusually weak or tired Side effects that usually do not require medical attention (report to your doctor or health care professional if they continue or are bothersome):  headache  nausea, vomiting This list may not describe all possible side effects. Call your doctor for medical advice about side effects. You may report side effects to FDA at 1-800-FDA-1088. Where should I keep my medicine? Keep  out of the reach of children. Store at room temperature between 15 and 30 degrees C (59 and 86 degrees F). Keep container tightly closed. Throw away any unused medicine after the expiration date. NOTE: This sheet is a summary. It may not cover all possible information. If you have questions about this medicine, talk to your doctor, pharmacist, or health care provider.  2020 Elsevier/Gold Standard (2018-04-08 12:49:26)

## 2019-08-20 LAB — CERVICOVAGINAL ANCILLARY ONLY
Bacterial Vaginitis (gardnerella): NEGATIVE
Candida Glabrata: NEGATIVE
Candida Vaginitis: POSITIVE — AB
Chlamydia: NEGATIVE
Comment: NEGATIVE
Comment: NEGATIVE
Comment: NEGATIVE
Comment: NEGATIVE
Comment: NEGATIVE
Comment: NORMAL
Neisseria Gonorrhea: NEGATIVE
Trichomonas: NEGATIVE

## 2019-08-21 ENCOUNTER — Other Ambulatory Visit (INDEPENDENT_AMBULATORY_CARE_PROVIDER_SITE_OTHER): Payer: Medicaid Other | Admitting: Obstetrics and Gynecology

## 2019-08-21 DIAGNOSIS — B373 Candidiasis of vulva and vagina: Secondary | ICD-10-CM

## 2019-08-21 DIAGNOSIS — B3731 Acute candidiasis of vulva and vagina: Secondary | ICD-10-CM

## 2019-08-21 MED ORDER — FLUCONAZOLE 150 MG PO TABS: 150.0000 mg | ORAL_TABLET | Freq: Every day | ORAL | 1 refills | Status: DC

## 2019-08-21 NOTE — Progress Notes (Signed)
Rx sent 

## 2019-08-22 LAB — URINE CULTURE, OB REFLEX

## 2019-08-22 LAB — CULTURE, OB URINE

## 2019-08-24 ENCOUNTER — Telehealth: Payer: Self-pay | Admitting: *Deleted

## 2019-08-24 DIAGNOSIS — B3731 Acute candidiasis of vulva and vagina: Secondary | ICD-10-CM

## 2019-08-24 DIAGNOSIS — B373 Candidiasis of vulva and vagina: Secondary | ICD-10-CM

## 2019-08-24 MED ORDER — TERCONAZOLE 0.4 % VA CREA: 1.0000 | TOPICAL_CREAM | Freq: Every day | VAGINAL | 0 refills | Status: DC

## 2019-08-24 NOTE — Telephone Encounter (Signed)
Patient called regarding vaginal cultures. Verified DOB. Patient + for yeast. Vaginal yeast cream sent to pharmacy.  Clovis Pu, RN

## 2019-08-25 ENCOUNTER — Inpatient Hospital Stay (HOSPITAL_COMMUNITY)
Admission: AD | Admit: 2019-08-25 | Discharge: 2019-08-25 | Disposition: A | Discharge status: Home / Self Care | Payer: Medicaid Other | Attending: Obstetrics and Gynecology | Admitting: Obstetrics and Gynecology

## 2019-08-25 ENCOUNTER — Encounter (HOSPITAL_COMMUNITY): Payer: Self-pay | Admitting: Obstetrics and Gynecology

## 2019-08-25 ENCOUNTER — Other Ambulatory Visit: Payer: Self-pay

## 2019-08-25 DIAGNOSIS — O26892 Other specified pregnancy related conditions, second trimester: Secondary | ICD-10-CM | POA: Diagnosis not present

## 2019-08-25 DIAGNOSIS — R519 Headache, unspecified: Secondary | ICD-10-CM | POA: Diagnosis not present

## 2019-08-25 DIAGNOSIS — O210 Mild hyperemesis gravidarum: Secondary | ICD-10-CM | POA: Insufficient documentation

## 2019-08-25 DIAGNOSIS — D649 Anemia, unspecified: Secondary | ICD-10-CM | POA: Diagnosis not present

## 2019-08-25 DIAGNOSIS — N898 Other specified noninflammatory disorders of vagina: Secondary | ICD-10-CM

## 2019-08-25 DIAGNOSIS — O219 Vomiting of pregnancy, unspecified: Secondary | ICD-10-CM

## 2019-08-25 DIAGNOSIS — R109 Unspecified abdominal pain: Secondary | ICD-10-CM

## 2019-08-25 DIAGNOSIS — Z3A15 15 weeks gestation of pregnancy: Secondary | ICD-10-CM | POA: Diagnosis not present

## 2019-08-25 DIAGNOSIS — O99012 Anemia complicating pregnancy, second trimester: Secondary | ICD-10-CM | POA: Diagnosis not present

## 2019-08-25 DIAGNOSIS — R1030 Lower abdominal pain, unspecified: Secondary | ICD-10-CM | POA: Insufficient documentation

## 2019-08-25 DIAGNOSIS — O26899 Other specified pregnancy related conditions, unspecified trimester: Secondary | ICD-10-CM

## 2019-08-25 LAB — URINALYSIS, ROUTINE W REFLEX MICROSCOPIC
Bilirubin Urine: NEGATIVE
Glucose, UA: NEGATIVE mg/dL
Hgb urine dipstick: NEGATIVE
Ketones, ur: 5 mg/dL — AB
Nitrite: NEGATIVE
Protein, ur: 100 mg/dL — AB
Specific Gravity, Urine: 1.029 (ref 1.005–1.030)
pH: 8 (ref 5.0–8.0)

## 2019-08-25 LAB — WET PREP, GENITAL
Clue Cells Wet Prep HPF POC: NONE SEEN
Sperm: NONE SEEN
Trich, Wet Prep: NONE SEEN
Yeast Wet Prep HPF POC: NONE SEEN

## 2019-08-25 LAB — CBC
HCT: 34.2 % — ABNORMAL LOW (ref 36.0–46.0)
Hemoglobin: 10.9 g/dL — ABNORMAL LOW (ref 12.0–15.0)
MCH: 25.6 pg — ABNORMAL LOW (ref 26.0–34.0)
MCHC: 31.9 g/dL (ref 30.0–36.0)
MCV: 80.5 fL (ref 80.0–100.0)
Platelets: 242 10*3/uL (ref 150–400)
RBC: 4.25 MIL/uL (ref 3.87–5.11)
RDW: 16 % — ABNORMAL HIGH (ref 11.5–15.5)
WBC: 10.9 10*3/uL — ABNORMAL HIGH (ref 4.0–10.5)
nRBC: 0 % (ref 0.0–0.2)

## 2019-08-25 MED ORDER — METOCLOPRAMIDE HCL 5 MG/ML IJ SOLN
10.0000 mg | Freq: Once | INTRAMUSCULAR | Status: AC
  Administered 2019-08-25: 18:00:00 10 mg via INTRAVENOUS
  Filled 2019-08-25: qty 2

## 2019-08-25 MED ORDER — SCOPOLAMINE 1 MG/3DAYS TD PT72: 1.0000 | MEDICATED_PATCH | TRANSDERMAL | 2 refills | Status: DC

## 2019-08-25 MED ORDER — SCOPOLAMINE 1 MG/3DAYS TD PT72: 1.0000 | MEDICATED_PATCH | TRANSDERMAL | 12 refills | Status: DC

## 2019-08-25 MED ORDER — LACTATED RINGERS IV BOLUS
1000.0000 mL | Freq: Once | INTRAVENOUS | Status: AC
  Administered 2019-08-25: 1000 mL via INTRAVENOUS

## 2019-08-25 MED ORDER — SCOPOLAMINE 1 MG/3DAYS TD PT72
1.0000 | MEDICATED_PATCH | TRANSDERMAL | Status: DC
  Administered 2019-08-25: 1.5 mg via TRANSDERMAL
  Filled 2019-08-25: qty 1

## 2019-08-25 MED ORDER — DEXAMETHASONE SODIUM PHOSPHATE 10 MG/ML IJ SOLN
10.0000 mg | Freq: Once | INTRAMUSCULAR | Status: AC
  Administered 2019-08-25: 10 mg via INTRAVENOUS
  Filled 2019-08-25: qty 1

## 2019-08-25 MED ORDER — FERROUS SULFATE 325 (65 FE) MG PO TABS: 325.0000 mg | ORAL_TABLET | ORAL | 5 refills | Status: DC

## 2019-08-25 NOTE — MAU Provider Note (Signed)
Chief Complaint: Vaginal Discharge and Headache   First Provider Initiated Contact with Patient 08/25/19 1645      SUBJECTIVE HPI: Michele Carney is a 19 y.o. G1P0 at [redacted]w[redacted]d by early ultrasound pt of [facility] who presents to maternity admissions reporting constant lower abdominal cramping, yellow vaginal discharge, and headache x 2 days unresolved by Tylenol.  She describes the pain as constant soreness pain that does not radiate.  The discharge was concerning to the pt because it looked like mucus, and she was concerned about her mucus plug. She was recently treated for yeast infection with topical medication and denies any itching at this time.  Pt is unhappy with her OB office and desires to transfer to another office.     HPI  Past Medical History:  Diagnosis Date  . Medical history non-contributory    Past Surgical History:  Procedure Laterality Date  . NO PAST SURGERIES     Social History   Socioeconomic History  . Marital status: Single    Spouse name: Not on file  . Number of children: Not on file  . Years of education: Not on file  . Highest education level: High school graduate  Occupational History  . Not on file  Tobacco Use  . Smoking status: Never Smoker  . Smokeless tobacco: Never Used  Substance and Sexual Activity  . Alcohol use: Not Currently  . Drug use: Not Currently    Types: Marijuana  . Sexual activity: Yes    Comment: on the patch prior  Other Topics Concern  . Not on file  Social History Narrative  . Not on file   Social Determinants of Health   Financial Resource Strain: Low Risk   . Difficulty of Paying Living Expenses: Not hard at all  Food Insecurity: No Food Insecurity  . Worried About Programme researcher, broadcasting/film/video in the Last Year: Never true  . Ran Out of Food in the Last Year: Never true  Transportation Needs: No Transportation Needs  . Lack of Transportation (Medical): No  . Lack of Transportation (Non-Medical): No  Physical  Activity:   . Days of Exercise per Week:   . Minutes of Exercise per Session:   Stress: No Stress Concern Present  . Feeling of Stress : Not at all  Social Connections:   . Frequency of Communication with Friends and Family:   . Frequency of Social Gatherings with Friends and Family:   . Attends Religious Services:   . Active Member of Clubs or Organizations:   . Attends Banker Meetings:   Marland Kitchen Marital Status:   Intimate Partner Violence: Not At Risk  . Fear of Current or Ex-Partner: No  . Emotionally Abused: No  . Physically Abused: No  . Sexually Abused: No   No current facility-administered medications on file prior to encounter.   Current Outpatient Medications on File Prior to Encounter  Medication Sig Dispense Refill  . Prenatal Vit-Fe Fumarate-FA (PRENATAL MULTIVITAMIN) TABS tablet Take 1 tablet by mouth daily at 12 noon.    . Blood Pressure Monitoring (BLOOD PRESSURE MONITOR AUTOMAT) DEVI 1 Device by Does not apply route daily. Automatic blood pressure cuff regular size. To monitor blood pressure regularly at home. ICD-10 code: O09.90 1 each 0  . cyclobenzaprine (FLEXERIL) 10 MG tablet Take 1 tablet (10 mg total) by mouth every 8 (eight) hours as needed for muscle spasms. 30 tablet 1  . Misc. Devices (GOJJI WEIGHT SCALE) MISC 1 Device by Does  not apply route daily as needed. To weight self daily as needed at home. ICD-10 code: O09.90 1 each 0  . ondansetron (ZOFRAN ODT) 8 MG disintegrating tablet Take 1 tablet (8 mg total) by mouth every 8 (eight) hours as needed for nausea or vomiting. 20 tablet 3  . pantoprazole (PROTONIX) 20 MG tablet Take 1 tablet (20 mg total) by mouth daily. (Patient not taking: Reported on 08/19/2019) 30 tablet 1  . promethazine (PHENERGAN) 25 MG tablet Take 0.5-1 tablets (12.5-25 mg total) by mouth every 6 (six) hours as needed for nausea or vomiting. 30 tablet 5  . terconazole (TERAZOL 7) 0.4 % vaginal cream Place 1 applicator vaginally at  bedtime. 45 g 0   No Known Allergies  ROS:  Review of Systems  Constitutional: Negative for chills and fever.  Respiratory: Negative for cough and shortness of breath.   Cardiovascular: Negative for chest pain.  Gastrointestinal: Positive for abdominal pain. Negative for nausea and vomiting.  Genitourinary: Positive for vaginal discharge. Negative for dysuria, frequency and urgency.  Musculoskeletal: Negative.   Neurological: Positive for headaches. Negative for dizziness.     I have reviewed patient's Past Medical Hx, Surgical Hx, Family Hx, Social Hx, medications and allergies.   Physical Exam   Patient Vitals for the past 24 hrs:  BP Temp Pulse Resp SpO2 Weight  08/25/19 1948 122/62 -- -- -- -- --  08/25/19 1601 (!) 91/56 -- (!) 105 -- -- --  08/25/19 1600 -- -- -- -- 98 % --  08/25/19 1555 -- -- -- -- 99 % --  08/25/19 1542 134/63 98.3 F (36.8 C) (!) 116 18 100 % 81.6 kg   Constitutional: Well-developed, well-nourished female in no acute distress.  Cardiovascular: normal rate Respiratory: normal effort GI: Abd soft, non-tender. Pos BS x 4 MS: Extremities nontender, no edema, normal ROM Neurologic: Alert and oriented x 4.  GU: Neg CVAT.  PELVIC EXAM: Cervix pink, visually closed, without lesion, scant white creamy discharge, vaginal walls and external genitalia normal   FHT 157 by doppler  LAB RESULTS Results for orders placed or performed during the hospital encounter of 08/25/19 (from the past 24 hour(s))  Urinalysis, Routine w reflex microscopic     Status: Abnormal   Collection Time: 08/25/19  3:59 PM  Result Value Ref Range   Color, Urine YELLOW YELLOW   APPearance HAZY (A) CLEAR   Specific Gravity, Urine 1.029 1.005 - 1.030   pH 8.0 5.0 - 8.0   Glucose, UA NEGATIVE NEGATIVE mg/dL   Hgb urine dipstick NEGATIVE NEGATIVE   Bilirubin Urine NEGATIVE NEGATIVE   Ketones, ur 5 (A) NEGATIVE mg/dL   Protein, ur 100 (A) NEGATIVE mg/dL   Nitrite NEGATIVE NEGATIVE    Leukocytes,Ua SMALL (A) NEGATIVE   RBC / HPF 0-5 0 - 5 RBC/hpf   WBC, UA 0-5 0 - 5 WBC/hpf   Bacteria, UA MANY (A) NONE SEEN   Squamous Epithelial / LPF 11-20 0 - 5   Mucus PRESENT   Wet prep, genital     Status: Abnormal   Collection Time: 08/25/19  5:03 PM   Specimen: Genital  Result Value Ref Range   Yeast Wet Prep HPF POC NONE SEEN NONE SEEN   Trich, Wet Prep NONE SEEN NONE SEEN   Clue Cells Wet Prep HPF POC NONE SEEN NONE SEEN   WBC, Wet Prep HPF POC MODERATE (A) NONE SEEN   Sperm NONE SEEN   CBC     Status: Abnormal  Collection Time: 08/25/19  5:28 PM  Result Value Ref Range   WBC 10.9 (H) 4.0 - 10.5 K/uL   RBC 4.25 3.87 - 5.11 MIL/uL   Hemoglobin 10.9 (L) 12.0 - 15.0 g/dL   HCT 10.9 (L) 32.3 - 55.7 %   MCV 80.5 80.0 - 100.0 fL   MCH 25.6 (L) 26.0 - 34.0 pg   MCHC 31.9 30.0 - 36.0 g/dL   RDW 32.2 (H) 02.5 - 42.7 %   Platelets 242 150 - 400 K/uL   nRBC 0.0 0.0 - 0.2 %    A/Positive/-- (03/03 1556)  IMAGING No results found.  MAU Management/MDM: Orders Placed This Encounter  Procedures  . Wet prep, genital  . Culture, OB Urine  . Urinalysis, Routine w reflex microscopic  . CBC  . Discharge patient    Meds ordered this encounter  Medications  . lactated ringers bolus 1,000 mL  . metoCLOPramide (REGLAN) injection 10 mg  . dexamethasone (DECADRON) injection 10 mg  . scopolamine (TRANSDERM-SCOP) 1 MG/3DAYS 1.5 mg  . ferrous sulfate 325 (65 FE) MG tablet    Sig: Take 1 tablet (325 mg total) by mouth every other day.    Dispense:  30 tablet    Refill:  5    Order Specific Question:   Supervising Provider    Answer:   [place] Bing [id number]  . scopolamine (TRANSDERM-SCOP, 1.5 MG,) 1 MG/3DAYS    Sig: Place 1 patch (1.5 mg total) onto the skin every 3 (three) days.    Dispense:  10 patch    Refill:  12    Order Specific Question:   Supervising Provider    Answer:   [place] Bing [[id number]]  . scopolamine (TRANSDERM-SCOP, 1.5 MG,) 1 MG/3DAYS    Sig:  Place 1 patch (1.5 mg total) onto the skin every 3 (three) days.    Dispense:  10 patch    Refill:  2    Order Specific Question:   Supervising Provider    Answer:   [place] Bing [[id number]]    Pt with headache resolved with headache cocktail with LR 1000 ml, Reglan 10 mg IV, and Decadron 10 mg IV. Benadryl not given since pt driving. Anemia is likely cause of h/a, with drop in Hgb since initial OB.  Will start tx with ferrous sulfate every other day.  Scopolamine patch placed on pt today in MAU and Rx sent to new pharmacy since her pharmacy has not been able to fill it and is waiting to stock the med.  Pt is unhappy with her office at [facility] and wants to transfer to [facility].  Message sent to establish care.  Pt to keep MFM ultrasound appointment.  Return to MAU as needed for emergencies.     ASSESSMENT 1. Anemia affecting pregnancy in second trimester   2. Headache in pregnancy, antepartum, second trimester   3. Abdominal pain affecting pregnancy   4. Vaginal discharge during pregnancy in second trimester   5. Nausea and vomiting in pregnancy     PLAN Discharge home Allergies as of 08/25/2019   No Known Allergies     Medication List    STOP taking these medications   cephALEXin 500 MG capsule Commonly known as: KEFLEX   fluconazole 150 MG tablet Commonly known as: Diflucan     TAKE these medications   Blood Pressure Monitor Automat Devi 1 Device by Does not apply route daily. Automatic blood pressure cuff regular size. To monitor blood pressure regularly at  home. ICD-10 code: O09.90   cyclobenzaprine 10 MG tablet Commonly known as: FLEXERIL Take 1 tablet (10 mg total) by mouth every 8 (eight) hours as needed for muscle spasms.   ferrous sulfate 325 (65 FE) MG tablet Take 1 tablet (325 mg total) by mouth every other day.   Gojji Weight Scale Misc 1 Device by Does not apply route daily as needed. To weight self daily as needed at home. ICD-10 code: O09.90    ondansetron 8 MG disintegrating tablet Commonly known as: Zofran ODT Take 1 tablet (8 mg total) by mouth every 8 (eight) hours as needed for nausea or vomiting.   pantoprazole 20 MG tablet Commonly known as: Protonix Take 1 tablet (20 mg total) by mouth daily.   prenatal multivitamin Tabs tablet Take 1 tablet by mouth daily at 12 noon.   promethazine 25 MG tablet Commonly known as: PHENERGAN Take 0.5-1 tablets (12.5-25 mg total) by mouth every 6 (six) hours as needed for nausea or vomiting.   scopolamine 1 MG/3DAYS Commonly known as: Transderm-Scop (1.5 MG) Place 1 patch (1.5 mg total) onto the skin every 3 (three) days.   scopolamine 1 MG/3DAYS Commonly known as: Transderm-Scop (1.5 MG) Place 1 patch (1.5 mg total) onto the skin every 3 (three) days.   terconazole 0.4 % vaginal cream Commonly known as: TERAZOL 7 Place 1 applicator vaginally at bedtime.      Follow-up Information    [facility] Follow up.   Why: The office will call you with an appointment. Return to MAU as needed for emergencies.  Contact information: [address] [place] [address] [phone]          Sharen Counter Certified Nurse-Midwife 08/25/2019  8:02 PM

## 2019-08-25 NOTE — Discharge Instructions (Signed)
[facility] [employer] for [employer] at [facility]       Phone: [phone]  Center for [employer] at [place]   Phone: [phone]  Center for [employer] at [place]  Phone: [phone]  Center for [employer] at [employer]  Phone: [phone]  Center for [employer] at [place]  Phone: [phone]  Center for [facility] at [facility]   Phone: [phone]  [facility]       Phone: [phone]  [facility]    Phone: [phone]   [facility]    Phone: [phone]  [facility]    Phone: [phone]  [facility]    Phone: [phone]  [facility] Health Department-Family Planning       Phone: [phone]   [facility] Health Department-Maternity  Phone: [phone]  [facility]    Phone: [phone]  [facility] [place]   Phone: [phone]  [facility]anned [facility]      Phone: [phone]  [facility] and Infertility    Phone: [phone]  [facility]iatric [facility] [place] [facility] Care  [address] [place], [place] [phone]  [facility] [address] [place], [place] [phone] or [phone] [email or web address]  [facility], 8:30-5:00 [address], [place], [place] [phone] [email or web address]  *Bring your own interpreter at 1st visit  [facility] [address], [place], [place] [phone] [email or web address]   Psychotherapeutic Services/ACT Services  [address] [place], [place] [phone]  [facility] Walk-in Mon-Fri, 8am-3pm [address], [place], [place] [phone] [email or web address]  [facility]  [facility] [address] [place], [place], [place] [phone]  [facility] [address], [place], [place] [phone]  [facility] [address], [place], [place] [phone] *pacientes que hablen espanol, favor comunicarse con el Sr. Carlock, extension [phone] o la [address], extension [place] para hacer una cita.   [facility] [address] [place] [phone] (Habla Espanol)  [facility] [address] [place], [place], [place] [phone]  [facility] [address], [place], [place] [phone]   [facility]: [facility] HEALS(Healing and Empowering All Survivors)  [address], [place], [place] [phone] [email or web address]  *Uninsured and underinsured, ages 19-64  [facility] [address] [employer], [place], [place] [phone] (Habla Espanol)  [facility] [address], [place], [place] [phone] (Habla Espanol)  [facility] [address] [place], [place] [phone] Clarice Pole)  [facility] Mon-Thurs 8:30am-8:00pm/ Fri 8:30-7:00pm [address] [place], [place] (3rd floor, located at corner of [employer] and [employer]) Call [phone] to schedule an appointment [email or web address]  [facility] [address], [place], [place]  [phone]  [facility]  [address] [place], [place] [phone]  Social Support MHAG (Mental Health Association of [place]) [phone] or www.[email or web address] [address], [place], [place] [address] * Recovery support and educational programs, including recovery skills classes, support groups, and one-on-one sessions with [place] Certified Peer Support Specialists.    [facility]) [place] NAMI Helpline: [phone] * Family and Friends Support Group/  Contact Philomena Doheny at [phone] for more information * Family to [employer] and Basics Class : enroll online or email Darreld Mclean at [email or web address]  * Monthly educational meetings, contact  Starr [place] at [phone] [email or web address]    24- Hour Availability:  *[place] 5078057975 or [phone]  * Family Service of the [employer] (Domestic Violence, Rape, Victim Assistance) Line [phone]  [place]ly Sessions [phone] or [phone]  * [facility]  [phone] only([phone] hours)  *Therapeutic Alternative Mobile Crisis Unit [phone]  *[place] National Suicide Hotline 757-169-2719   Iron-Rich Diet  Iron is a mineral that helps your body to produce hemoglobin. Hemoglobin is a protein in red blood cells that carries oxygen to your body's tissues. Eating too little iron may cause you to feel weak and tired, and it can increase your risk of infection. Iron is naturally found in many foods, and many foods have iron added to them (iron-fortified foods). You may need to follow an iron-rich diet if you do not have enough iron in your body due to certain medical conditions. The amount of iron that you need each day depends on your age, your sex, and any medical conditions you have. Follow instructions from your health care provider or a diet and nutrition specialist (dietitian) about how much iron you should eat each day. What are tips for following this plan? Reading food labels  Check food labels to see how many milligrams (mg) of iron are in each serving. Cooking  Cook foods in pots and pans that are made from iron.  Take these steps to make it easier for your body to absorb iron from certain foods: ? Soak beans overnight before cooking. ? Soak whole grains overnight and drain them before using. ? Ferment flours before  baking, such as by using yeast in bread dough. Meal planning  When you eat foods that contain iron, you should eat them with foods that are high in vitamin C. These include oranges, peppers, tomatoes, potatoes, and mango. Vitamin C helps your body to absorb iron. General information  Take iron supplements only as told by your health care provider. An overdose of iron can be life-threatening. If you were prescribed iron supplements, take them with orange juice or a vitamin C supplement.  When you eat iron-fortified foods or take an iron supplement, you should also eat foods that naturally contain iron, such as meat, poultry, and fish. Eating naturally iron-rich foods helps your body to absorb the iron that is added to other foods or contained in a supplement.  Certain foods and drinks prevent your body from absorbing iron properly. Avoid eating these foods in the same meal as iron-rich foods or with iron supplements. These foods include: ? Coffee, black tea, and red wine. ? Milk, dairy products, and foods that are high in calcium. ? Beans and soybeans. ? Whole grains. What foods should I eat? Fruits Prunes. Raisins. Eat fruits high in vitamin C, such as oranges, grapefruits, and strawberries, alongside iron-rich foods. Vegetables Spinach (cooked). Green peas. Broccoli. Fermented vegetables. Eat vegetables high in vitamin C, such as leafy greens, potatoes, bell peppers, and tomatoes, alongside iron-rich foods. Grains Iron-fortified breakfast cereal. Iron-fortified whole-wheat bread. Enriched rice. Sprouted grains. Meats and other proteins Beef liver. Oysters. Beef. Shrimp. [place]. Chicken. [place]. Sardines. Chickpeas. Nuts. Tofu. Pumpkin seeds. Beverages Tomato juice. Fresh orange juice. Prune juice. Hibiscus tea. Fortified instant breakfast shakes. Sweets and desserts Blackstrap molasses. Seasonings and condiments Tahini. Fermented soy sauce. Other foods Wheat germ. The items listed  above may not be a complete list of recommended foods and beverages. Contact a dietitian for more information. What foods should I avoid? Grains Whole  grains. Bran cereal. Bran flour. Oats. Meats and other proteins Soybeans. Products made from soy protein. Black beans. Lentils. Mung beans. Split peas. Dairy Milk. Cream. Cheese. Yogurt. Cottage cheese. Beverages Coffee. Black tea. Red wine. Sweets and desserts Cocoa. Chocolate. Ice cream. Other foods Basil. Oregano. Large amounts of parsley. The items listed above may not be a complete list of foods and beverages to avoid. Contact a dietitian for more information. Summary  Iron is a mineral that helps your body to produce hemoglobin. Hemoglobin is a protein in red blood cells that carries oxygen to your body's tissues.  Iron is naturally found in many foods, and many foods have iron added to them (iron-fortified foods).  When you eat foods that contain iron, you should eat them with foods that are high in vitamin C. Vitamin C helps your body to absorb iron.  Certain foods and drinks prevent your body from absorbing iron properly, such as whole grains and dairy products. You should avoid eating these foods in the same meal as iron-rich foods or with iron supplements. This information is not intended to replace advice given to you by your health care provider. Make sure you discuss any questions you have with your health care provider. Document Revised: 04/18/2017 Document Reviewed: 04/01/2017 Elsevier Patient Education  2020 [employer].

## 2019-08-25 NOTE — MAU Note (Signed)
.   Michele Carney is a 19 y.o. at [redacted]w[redacted]d here in MAU reporting: she noticed a yellow mucous discharge today. Has a headache that she cant get rid of. States she has taking tylenol and it doesn't help LMP: 04/23/19 Onset of complaint: ongoing Pain score: ABD 6/10  HEAD 8/10 Vitals:   08/25/19 1542  BP: 134/63  Pulse: (!) 116  Resp: 18  Temp: 98.3 F (36.8 C)  SpO2: 100%     FHT:158 Lab orders placed from triage: UA

## 2019-08-26 LAB — GC/CHLAMYDIA PROBE AMP (~~LOC~~) NOT AT ARMC
Chlamydia: NEGATIVE
Comment: NEGATIVE
Comment: NORMAL
Neisseria Gonorrhea: NEGATIVE

## 2019-08-27 LAB — CULTURE, OB URINE: Culture: 10000 — AB

## 2019-08-28 ENCOUNTER — Other Ambulatory Visit: Payer: Self-pay

## 2019-08-28 MED ORDER — CEPHALEXIN 500 MG PO CAPS: 500.0000 mg | ORAL_CAPSULE | Freq: Three times a day (TID) | ORAL | 0 refills | Status: DC

## 2019-08-31 ENCOUNTER — Encounter: Payer: Self-pay | Admitting: Advanced Practice Midwife

## 2019-08-31 DIAGNOSIS — B951 Streptococcus, group B, as the cause of diseases classified elsewhere: Secondary | ICD-10-CM | POA: Insufficient documentation

## 2019-08-31 DIAGNOSIS — O2340 Unspecified infection of urinary tract in pregnancy, unspecified trimester: Secondary | ICD-10-CM | POA: Insufficient documentation

## 2019-08-31 LAB — OB RESULTS CONSOLE GBS: GBS: POSITIVE

## 2019-09-20 ENCOUNTER — Ambulatory Visit (HOSPITAL_COMMUNITY)
Admission: RE | Admit: 2019-09-20 | Discharge: 2019-09-20 | Disposition: A | Discharge status: Home / Self Care | Payer: Medicaid Other | Source: Ambulatory Visit | Attending: Obstetrics and Gynecology | Admitting: Obstetrics and Gynecology

## 2019-09-20 ENCOUNTER — Other Ambulatory Visit: Payer: Self-pay

## 2019-09-20 ENCOUNTER — Other Ambulatory Visit: Payer: Self-pay | Admitting: *Deleted

## 2019-09-20 ENCOUNTER — Other Ambulatory Visit: Payer: Medicaid Other

## 2019-09-20 ENCOUNTER — Other Ambulatory Visit: Payer: Self-pay | Admitting: Advanced Practice Midwife

## 2019-09-20 DIAGNOSIS — O99212 Obesity complicating pregnancy, second trimester: Secondary | ICD-10-CM

## 2019-09-20 DIAGNOSIS — Z3A19 19 weeks gestation of pregnancy: Secondary | ICD-10-CM

## 2019-09-20 DIAGNOSIS — Z3400 Encounter for supervision of normal first pregnancy, unspecified trimester: Secondary | ICD-10-CM

## 2019-09-20 DIAGNOSIS — Z362 Encounter for other antenatal screening follow-up: Secondary | ICD-10-CM

## 2019-09-20 DIAGNOSIS — Z363 Encounter for antenatal screening for malformations: Secondary | ICD-10-CM | POA: Diagnosis not present

## 2019-09-22 LAB — AFP, SERUM, OPEN SPINA BIFIDA
AFP MoM: 1.01
AFP Value: 45.6 ng/mL
Gest. Age on Collection Date: 19.1 weeks
Maternal Age At EDD: 20 yr
OSBR Risk 1 IN: 10000
Test Results:: NEGATIVE
Weight: 186 [lb_av]

## 2019-09-30 ENCOUNTER — Other Ambulatory Visit (HOSPITAL_COMMUNITY)
Admission: RE | Admit: 2019-09-30 | Discharge: 2019-09-30 | Disposition: A | Discharge status: Home / Self Care | Payer: Medicaid Other | Source: Ambulatory Visit | Attending: Advanced Practice Midwife | Admitting: Advanced Practice Midwife

## 2019-09-30 ENCOUNTER — Telehealth: Payer: Medicaid Other | Admitting: Obstetrics and Gynecology

## 2019-09-30 ENCOUNTER — Other Ambulatory Visit: Payer: Self-pay

## 2019-09-30 ENCOUNTER — Ambulatory Visit (INDEPENDENT_AMBULATORY_CARE_PROVIDER_SITE_OTHER): Payer: Medicaid Other | Admitting: Advanced Practice Midwife

## 2019-09-30 VITALS — BP 113/66 | HR 90 | Wt 185.6 lb

## 2019-09-30 DIAGNOSIS — N898 Other specified noninflammatory disorders of vagina: Secondary | ICD-10-CM

## 2019-09-30 DIAGNOSIS — Z3400 Encounter for supervision of normal first pregnancy, unspecified trimester: Secondary | ICD-10-CM

## 2019-09-30 DIAGNOSIS — O26899 Other specified pregnancy related conditions, unspecified trimester: Secondary | ICD-10-CM

## 2019-09-30 DIAGNOSIS — R102 Pelvic and perineal pain: Secondary | ICD-10-CM

## 2019-09-30 DIAGNOSIS — R109 Unspecified abdominal pain: Secondary | ICD-10-CM

## 2019-09-30 DIAGNOSIS — O26892 Other specified pregnancy related conditions, second trimester: Secondary | ICD-10-CM

## 2019-09-30 MED ORDER — TERCONAZOLE 0.4 % VA CREA: 1.0000 | TOPICAL_CREAM | Freq: Every day | VAGINAL | 0 refills | Status: DC

## 2019-09-30 NOTE — Progress Notes (Signed)
   PRENATAL VISIT NOTE  Subjective:  Michele Carney is a 19 y.o. G1P0 at [redacted]w[redacted]d being seen today for ongoing prenatal care.  She is currently monitored for the following issues for this low-risk pregnancy and has Supervision of normal first pregnancy, antepartum and GBS (group B streptococcus) UTI complicating pregnancy on their problem list.  Patient reports intermittent abdominal pain and vaginal itching.  Contractions: Not present. Vag. Bleeding: None.  Movement: Absent. Denies leaking of fluid.   The following portions of the patient's history were reviewed and updated as appropriate: allergies, current medications, past family history, past medical history, past social history, past surgical history and problem list.   Objective:   Vitals:   09/30/19 1112  BP: 113/66  Pulse: 90  Weight: 185 lb 9.6 oz (84.2 kg)    Fetal Status: Fetal Heart Rate (bpm): 152   Movement: Absent     General:  Alert, oriented and cooperative. Patient is in no acute distress.  Skin: Skin is warm and dry. No rash noted.   Cardiovascular: Normal heart rate noted  Respiratory: Normal respiratory effort, no problems with respiration noted  Abdomen: Soft, gravid, appropriate for gestational age.  Pain/Pressure: Present     Pelvic: Cervical exam deferred        Extremities: Normal range of motion.  Edema: None  Mental Status: Normal mood and affect. Normal behavior. Normal judgment and thought content.   Assessment and Plan:  Pregnancy: G1P0 at [redacted]w[redacted]d  1. Supervision of normal first pregnancy, antepartum --Anticipatory guidance about next visits/weeks of pregnancy given. --Next visit in office in 2 weeks to follow up on symptoms, see below  2. Pain of round ligament affecting pregnancy, antepartum --Sharp pain in RLQ after getting up this morning, not present now in office. --Pt with GBS, treated as UTI in first trimester --UA and urine culture to evaluate --Rest/ice/heat/warm bath/Tylenol for  pain  3. Abdominal pain during pregnancy in second trimester --No s/sx of labor, reviewed preterm labor symptoms, reasons to seek care --Pain started after urinating  - POC Urinalysis Dipstick OB - Culture, OB Urine  4. Vagina itching --Pt reports recurrent yeast infection since February.  Has not responded to topical medications but pt does not want oral medications at this time. Will try Terazol again, wait for results.  Evaluate again for STDs, all testing has been negative. - Cervicovaginal ancillary only( [place])  Preterm labor symptoms and general obstetric precautions including but not limited to vaginal bleeding, contractions, leaking of fluid and fetal movement were reviewed in detail with the patient. Please refer to After Visit Summary for other counseling recommendations.   Return in about 2 weeks (around 10/14/2019).  Future Appointments  Date Time Provider Department Center  10/19/2019  9:30 AM [facility] NURSE [facility]  10/19/2019  9:30 AM [facility] US3 [facility]    Sharen Counter, CNM

## 2019-09-30 NOTE — Progress Notes (Signed)
Patient reports fetal movement and complains of some round ligament pain.

## 2019-09-30 NOTE — Patient Instructions (Signed)

## 2019-10-01 LAB — CERVICOVAGINAL ANCILLARY ONLY
Bacterial Vaginitis (gardnerella): NEGATIVE
Candida Glabrata: NEGATIVE
Candida Vaginitis: POSITIVE — AB
Chlamydia: NEGATIVE
Comment: NEGATIVE
Comment: NEGATIVE
Comment: NEGATIVE
Comment: NEGATIVE
Comment: NEGATIVE
Comment: NORMAL
Neisseria Gonorrhea: NEGATIVE
Trichomonas: NEGATIVE

## 2019-10-02 LAB — CULTURE, OB URINE

## 2019-10-02 LAB — URINE CULTURE, OB REFLEX: Organism ID, Bacteria: NO GROWTH

## 2019-10-14 ENCOUNTER — Ambulatory Visit (INDEPENDENT_AMBULATORY_CARE_PROVIDER_SITE_OTHER): Payer: Medicaid Other | Admitting: Obstetrics and Gynecology

## 2019-10-14 ENCOUNTER — Other Ambulatory Visit: Payer: Self-pay

## 2019-10-14 ENCOUNTER — Encounter: Payer: Self-pay | Admitting: Obstetrics and Gynecology

## 2019-10-14 ENCOUNTER — Other Ambulatory Visit (HOSPITAL_COMMUNITY)
Admission: RE | Admit: 2019-10-14 | Discharge: 2019-10-14 | Disposition: A | Discharge status: Home / Self Care | Payer: Medicaid Other | Source: Ambulatory Visit | Attending: Obstetrics and Gynecology | Admitting: Obstetrics and Gynecology

## 2019-10-14 VITALS — BP 113/69 | HR 103 | Wt 191.0 lb

## 2019-10-14 DIAGNOSIS — B951 Streptococcus, group B, as the cause of diseases classified elsewhere: Secondary | ICD-10-CM | POA: Insufficient documentation

## 2019-10-14 DIAGNOSIS — Z3A22 22 weeks gestation of pregnancy: Secondary | ICD-10-CM

## 2019-10-14 DIAGNOSIS — B373 Candidiasis of vulva and vagina: Secondary | ICD-10-CM | POA: Diagnosis present

## 2019-10-14 DIAGNOSIS — Z3400 Encounter for supervision of normal first pregnancy, unspecified trimester: Secondary | ICD-10-CM

## 2019-10-14 DIAGNOSIS — O2342 Unspecified infection of urinary tract in pregnancy, second trimester: Secondary | ICD-10-CM | POA: Insufficient documentation

## 2019-10-14 DIAGNOSIS — B9689 Other specified bacterial agents as the cause of diseases classified elsewhere: Secondary | ICD-10-CM

## 2019-10-14 DIAGNOSIS — B3731 Acute candidiasis of vulva and vagina: Secondary | ICD-10-CM | POA: Insufficient documentation

## 2019-10-14 MED ORDER — PANTOPRAZOLE SODIUM 40 MG PO TBEC: 40.0000 mg | DELAYED_RELEASE_TABLET | Freq: Every day | ORAL | 5 refills | Status: DC

## 2019-10-14 MED ORDER — FLUCONAZOLE 150 MG PO TABS: 150.0000 mg | ORAL_TABLET | ORAL | 0 refills | Status: DC

## 2019-10-14 NOTE — Progress Notes (Signed)
Increased heart burn taking a probiotic tums not working and rx not working spit up acid

## 2019-10-14 NOTE — Progress Notes (Signed)
Subjective:  Michele Carney is a 19 y.o. G1P0 at [redacted]w[redacted]d being seen today for ongoing prenatal care.  She is currently monitored for the following issues for this low-risk pregnancy and has Supervision of normal first pregnancy, antepartum; GBS (group B streptococcus) UTI complicating pregnancy; and Yeast vaginitis on their problem list.  Patient reports still c/o yeast infection and GERD.  Contractions: Not present. Vag. Bleeding: None.  Movement: Present. Denies leaking of fluid.   The following portions of the patient's history were reviewed and updated as appropriate: allergies, current medications, past family history, past medical history, past social history, past surgical history and problem list. Problem list updated.  Objective:   Vitals:   10/14/19 0910  BP: 113/69  Pulse: (!) 103  Weight: 191 lb (86.6 kg)    Fetal Status: Fetal Heart Rate (bpm): 145   Movement: Present     General:  Alert, oriented and cooperative. Patient is in no acute distress.  Skin: Skin is warm and dry. No rash noted.   Cardiovascular: Normal heart rate noted  Respiratory: Normal respiratory effort, no problems with respiration noted  Abdomen: Soft, gravid, appropriate for gestational age. Pain/Pressure: Absent     Pelvic:  Cervical exam performed        Extremities: Normal range of motion.  Edema: None  Mental Status: Normal mood and affect. Normal behavior. Normal judgment and thought content.   Urinalysis:      Assessment and Plan:  Pregnancy: G1P0 at [redacted]w[redacted]d  1. Supervision of normal first pregnancy, antepartum Stable F/U U/S next month to complete anatomy Glucola next visit - pantoprazole (PROTONIX) 40 MG tablet; Take 1 tablet (40 mg total) by mouth daily.  Dispense: 30 tablet; Refill: 5  2. Group B Streptococcus urinary tract infection affecting pregnancy in second trimester Tx while in labor  3. Yeast vaginitis  - fluconazole (DIFLUCAN) 150 MG tablet; Take 1 tablet (150 mg total) by  mouth every 3 (three) days. For three doses  Dispense: 3 tablet; Refill: 0  Preterm labor symptoms and general obstetric precautions including but not limited to vaginal bleeding, contractions, leaking of fluid and fetal movement were reviewed in detail with the patient. Please refer to After Visit Summary for other counseling recommendations.  Return in about 4 weeks (around 11/11/2019) for face to face, any provider, OB visit, fasting for Glucola.   Hermina Staggers, MD

## 2019-10-14 NOTE — Addendum Note (Signed)
Addended by: Thom Chimes on: 10/14/2019 10:31 AM   Modules accepted: Orders

## 2019-10-14 NOTE — Patient Instructions (Signed)

## 2019-10-15 LAB — CERVICOVAGINAL ANCILLARY ONLY
Bacterial Vaginitis (gardnerella): NEGATIVE
Candida Glabrata: NEGATIVE
Candida Vaginitis: NEGATIVE
Chlamydia: NEGATIVE
Comment: NEGATIVE
Comment: NEGATIVE
Comment: NEGATIVE
Comment: NEGATIVE
Comment: NEGATIVE
Comment: NORMAL
Neisseria Gonorrhea: NEGATIVE
Trichomonas: NEGATIVE

## 2019-10-19 ENCOUNTER — Other Ambulatory Visit: Payer: Self-pay | Admitting: *Deleted

## 2019-10-19 ENCOUNTER — Ambulatory Visit: Payer: Medicaid Other | Attending: Obstetrics and Gynecology

## 2019-10-19 ENCOUNTER — Other Ambulatory Visit: Payer: Self-pay

## 2019-10-19 ENCOUNTER — Ambulatory Visit: Payer: Medicaid Other

## 2019-10-19 DIAGNOSIS — Z362 Encounter for other antenatal screening follow-up: Secondary | ICD-10-CM | POA: Insufficient documentation

## 2019-10-19 DIAGNOSIS — E669 Obesity, unspecified: Secondary | ICD-10-CM | POA: Diagnosis not present

## 2019-10-19 DIAGNOSIS — O99212 Obesity complicating pregnancy, second trimester: Secondary | ICD-10-CM | POA: Diagnosis not present

## 2019-10-19 DIAGNOSIS — Z3A23 23 weeks gestation of pregnancy: Secondary | ICD-10-CM

## 2019-10-19 DIAGNOSIS — O3660X0 Maternal care for excessive fetal growth, unspecified trimester, not applicable or unspecified: Secondary | ICD-10-CM

## 2019-10-28 ENCOUNTER — Other Ambulatory Visit (HOSPITAL_COMMUNITY)
Admission: RE | Admit: 2019-10-28 | Discharge: 2019-10-28 | Disposition: A | Discharge status: Home / Self Care | Payer: Medicaid Other | Source: Ambulatory Visit | Attending: Obstetrics | Admitting: Obstetrics

## 2019-10-28 ENCOUNTER — Other Ambulatory Visit: Payer: Self-pay

## 2019-10-28 ENCOUNTER — Ambulatory Visit (HOSPITAL_BASED_OUTPATIENT_CLINIC_OR_DEPARTMENT_OTHER): Payer: Medicaid Other

## 2019-10-28 DIAGNOSIS — N898 Other specified noninflammatory disorders of vagina: Secondary | ICD-10-CM | POA: Diagnosis present

## 2019-10-28 DIAGNOSIS — Z3A00 Weeks of gestation of pregnancy not specified: Secondary | ICD-10-CM

## 2019-10-28 DIAGNOSIS — O26892 Other specified pregnancy related conditions, second trimester: Secondary | ICD-10-CM

## 2019-10-28 DIAGNOSIS — R300 Dysuria: Secondary | ICD-10-CM | POA: Insufficient documentation

## 2019-10-28 LAB — POCT URINALYSIS DIPSTICK
Bilirubin, UA: NEGATIVE
Blood, UA: NEGATIVE
Glucose, UA: NEGATIVE
Ketones, UA: NEGATIVE
Leukocytes, UA: NEGATIVE
Nitrite, UA: NEGATIVE
Protein, UA: NEGATIVE
Spec Grav, UA: 1.02 (ref 1.010–1.025)
Urobilinogen, UA: 0.2 E.U./dL
pH, UA: 7 (ref 5.0–8.0)

## 2019-10-28 NOTE — Addendum Note (Signed)
Addended by: Thom Chimes on: 10/28/2019 09:12 AM   Modules accepted: Orders

## 2019-10-28 NOTE — Progress Notes (Signed)
Michele Carney is here for a UA. UA negative.  Pt reports having dysuria for the past 4/5 days.  She said that her urine was dark and cloudy. She began to only drink water and cranberry juice. Urine yellow and clear today.  She also wanted to self swab.  She said that the discharge hasn't changed since her last swab on 09/30/19. Urine sent for culture. All results pending. -EH/RMA

## 2019-10-29 LAB — CERVICOVAGINAL ANCILLARY ONLY
Bacterial Vaginitis (gardnerella): NEGATIVE
Candida Glabrata: NEGATIVE
Candida Vaginitis: NEGATIVE
Chlamydia: NEGATIVE
Comment: NEGATIVE
Comment: NEGATIVE
Comment: NEGATIVE
Comment: NEGATIVE
Comment: NEGATIVE
Comment: NORMAL
Neisseria Gonorrhea: NEGATIVE
Trichomonas: NEGATIVE

## 2019-10-30 LAB — CULTURE, OB URINE

## 2019-10-30 LAB — URINE CULTURE, OB REFLEX

## 2019-11-16 ENCOUNTER — Ambulatory Visit (INDEPENDENT_AMBULATORY_CARE_PROVIDER_SITE_OTHER): Payer: Medicaid Other

## 2019-11-16 ENCOUNTER — Other Ambulatory Visit: Payer: Medicaid Other

## 2019-11-16 ENCOUNTER — Other Ambulatory Visit: Payer: Self-pay

## 2019-11-16 VITALS — BP 126/80 | HR 100 | Wt 203.0 lb

## 2019-11-16 DIAGNOSIS — O26842 Uterine size-date discrepancy, second trimester: Secondary | ICD-10-CM

## 2019-11-16 DIAGNOSIS — Z23 Encounter for immunization: Secondary | ICD-10-CM | POA: Diagnosis not present

## 2019-11-16 DIAGNOSIS — Z3A27 27 weeks gestation of pregnancy: Secondary | ICD-10-CM

## 2019-11-16 DIAGNOSIS — O99342 Other mental disorders complicating pregnancy, second trimester: Secondary | ICD-10-CM

## 2019-11-16 DIAGNOSIS — O26892 Other specified pregnancy related conditions, second trimester: Secondary | ICD-10-CM

## 2019-11-16 DIAGNOSIS — Z3400 Encounter for supervision of normal first pregnancy, unspecified trimester: Secondary | ICD-10-CM

## 2019-11-16 DIAGNOSIS — N941 Unspecified dyspareunia: Secondary | ICD-10-CM

## 2019-11-16 DIAGNOSIS — F4321 Adjustment disorder with depressed mood: Secondary | ICD-10-CM

## 2019-11-16 NOTE — Progress Notes (Signed)
LOW-RISK PREGNANCY OFFICE VISIT  Patient name: Michele Carney MRN [id number]  Date of birth: 12/24/2000 Chief Complaint:   Routine Prenatal Visit  Subjective:   Michele Carney is a 19 y.o. G1P0 female at [redacted]w[redacted]d with an Estimated Date of Delivery: 02/13/20 being seen today for ongoing management of a low-risk pregnancy.  Today she reports left side intermittent pain after ambulation and leg pain while working. Patient states the left sided sharp pain after ambulation that lasts ~30 seconds.  Patient reports that she works 8 hours a day at [employer] and starts to experience calf pain and ankle edema after about 5 hours.  Patient reports she does not wear compression stockings, but has tried foot inserts without improvement.  Patient requests referral for Doula and therapy services.  Patient reports that she has meet with our [facility], but desires in person appts. Patient also questions what to do to improve discomfort during sexual activity.  Patient states she has discomfort despite lubrication usage and position changes. She states that initially she contributed this discomfort to recurrent yeast infections, but since those have improved the pain continues.   Contractions: Not present. Vag. Bleeding: None.  Movement: Present. denies leaking of fluid.  Reviewed past medical,surgical, social, obstetrical and family history as well as problem list, medications and allergies.  Objective   Vitals:   11/16/19 0825  BP: 126/80  Pulse: 100  Weight: 203 lb (92.1 kg)  Body mass index is 38.36 kg/m.        Physical Examination:   General appearance: Well appearing, and in no distress  Mental status: Alert, oriented to person, place, and time  Skin: Warm & dry  Cardiovascular: Normal heart rate noted  Respiratory: Normal respiratory effort, no distress  Abdomen: Soft, gravid, nontender, LGA with Fundal height of Fundal Height: 33 cm  Pelvic: Cervical exam deferred            Extremities: Edema: None  Fetal Status: Fetal Heart Rate (bpm): 145  Movement: Present   No results found for this or any previous visit (from the past 24 hour(s)).  Assessment & Plan:  Low-risk pregnancy of a 19 y.o., G1P0 at [redacted]w[redacted]d with an Estimated Date of Delivery: 02/13/20   1. Supervision of normal first pregnancy, antepartum -Reviewed usage of compression stockings while working to increase comfort and decrease edema. -Encouraged breaks when possible and hydration during shift.  -Reassured that left side pain is likely round ligament pain.  Reviewed round ligament pain and patient instructed to monitor for worsening of symptoms. -Reviewed glucola testing and how results are handled including diabetic education and BS testing for abnormal results and routine care for normal results.  -Informed that iron level would also be evaluated and iron supplement increased if necessary.  -Discussed [facility]la services at [facility] and informed that provider will send information after contacting   2. Female dyspareunia -Reassured that discomfort during pregnancy is not abnormal considering extensive changes body is going through. -Patient unable to describe her discomfort. -Encouraged increased usage of lubrication.  3. Significant discrepancy between uterine size and clinical dates, antepartum, second trimester -FH 64 -Review of 6/1 US shows infant at 91% and patient rescheduled for repeat growth [place] in August. -Patient informed of findings and reassured that large baby does not necessarily mean anything is wrong.   4. Adjustment disorder with depressed mood -Patient informed that provider will attempt to get list of community resources offering in-person therapy. -Discussed usage of in-house LCSW until  that time. -A. Felton Clinton, LCSW to provider office and confirms that she has seen patient and will provide resources. -Patient to schedule appts with Sue Lush as needed.       Meds: No orders of the  defined types were placed in this encounter.  Labs/procedures today:  Lab Orders     Glucose Tolerance, 2 Hours w/1 Hour     RPR     CBC     HIV antibody (with reflex)   Reviewed: Preterm labor symptoms and general obstetric precautions including but not limited to vaginal bleeding, contractions, leaking of fluid and fetal movement were reviewed in detail with the patient.  All questions were answered.  Follow-up: No follow-ups on file.  Orders Placed This Encounter  Procedures  . Tdap vaccine greater than or equal to 7yo IM  . Glucose Tolerance, 2 Hours w/1 Hour  . RPR  . CBC  . HIV antibody (with reflex)   Cherre Robins MSN, CNM 11/16/2019

## 2019-11-16 NOTE — Progress Notes (Signed)
Patient reports fetal movement with occasional left sided pain.

## 2019-11-16 NOTE — Patient Instructions (Signed)
Michele LIST   [facility] Michele  [place]  [phone]  [email or web address]  [email or web address]  Michele Carney [phone]  [email or web address]   [facility] [employer]   [name]   [email or web address]   ??THE MOTHERLY Michele?? Michele Carney   [phone]   [email or web address]     [name] Michele  Michele Carney  [phone]    [email or web address]   Michele[facility] Michele Services  Michele Carney     [phone]     [email or web address]   Michele Carney: Michele & Photographer   Michele Carney [phone]       [email or web address]  [email or web address]   [employer] Michele Services  Michele Carney [phone]   [email or web address]   [place], [place]  Michele Carney  [phone]  [email or web address]   [email or web address]   [facility]   Michele Carney   [phone]  [email or web address]   Michele Carney Baker Michele  Michele Carney  [phone] [email or web address]  [name]  Cornelious Bryant         [facility]       Michele Carney     [phone] [email or web address]  [email or web address]   [facility]   Michele Carney [phone] [email or web address]   [facility]hele [facility]  Michele Carney     [phone]  [email or web address]  [facility]     [phone]  [email or web address]  Facebook and IG [email or web address] .  [phone] [phone] [phone]  [phone] [email or web address] .[email or web address]     [name]  [phone] [email or web address]   IG [email or web address]    [phone]    [phone]  [email or web address]   Michele Carney  [phone] [email or web address]    Michele Carney  [phone] [email or web address]   Michele Carney   [email or web address]    [phone]    [phone] [email or web address]     Michele Carney  [phone] [email or web address]    Michele Carney  [phone] [email or web address]    Michele Carney    [phone]   [facility] Red wing  [phone] [email or web address]  Michele Carney [phone] [email or web address]

## 2019-11-17 LAB — GLUCOSE TOLERANCE, 2 HOURS W/ 1HR
Glucose, 1 hour: 128 mg/dL (ref 65–179)
Glucose, 2 hour: 101 mg/dL (ref 65–152)
Glucose, Fasting: 85 mg/dL (ref 65–91)

## 2019-11-17 LAB — RPR: RPR Ser Ql: NONREACTIVE

## 2019-11-17 LAB — CBC
Hematocrit: 33.3 % — ABNORMAL LOW (ref 34.0–46.6)
Hemoglobin: 10.7 g/dL — ABNORMAL LOW (ref 11.1–15.9)
MCH: 26 pg — ABNORMAL LOW (ref 26.6–33.0)
MCHC: 32.1 g/dL (ref 31.5–35.7)
MCV: 81 fL (ref 79–97)
Platelets: 224 10*3/uL (ref 150–450)
RBC: 4.11 x10E6/uL (ref 3.77–5.28)
RDW: 14.1 % (ref 11.7–15.4)
WBC: 9.9 10*3/uL (ref 3.4–10.8)

## 2019-11-17 LAB — HIV ANTIBODY (ROUTINE TESTING W REFLEX): HIV Screen 4th Generation wRfx: NONREACTIVE

## 2019-12-06 ENCOUNTER — Other Ambulatory Visit: Payer: Self-pay | Admitting: *Deleted

## 2019-12-06 ENCOUNTER — Ambulatory Visit: Payer: Medicaid Other | Attending: Internal Medicine

## 2019-12-06 DIAGNOSIS — Z20822 Contact with and (suspected) exposure to covid-19: Secondary | ICD-10-CM

## 2019-12-07 ENCOUNTER — Ambulatory Visit: Payer: Medicaid Other | Admitting: Licensed Clinical Social Worker

## 2019-12-07 ENCOUNTER — Encounter: Payer: Medicaid Other | Admitting: Obstetrics and Gynecology

## 2019-12-07 LAB — NOVEL CORONAVIRUS, NAA: SARS-CoV-2, NAA: NOT DETECTED

## 2019-12-07 LAB — SARS-COV-2, NAA 2 DAY TAT

## 2019-12-11 ENCOUNTER — Other Ambulatory Visit: Payer: Self-pay

## 2019-12-11 ENCOUNTER — Ambulatory Visit
Admission: EM | Admit: 2019-12-11 | Discharge: 2019-12-11 | Disposition: A | Discharge status: Home / Self Care | Payer: Medicaid Other | Attending: Physician Assistant | Admitting: Physician Assistant

## 2019-12-11 DIAGNOSIS — R059 Cough, unspecified: Secondary | ICD-10-CM

## 2019-12-11 DIAGNOSIS — H66001 Acute suppurative otitis media without spontaneous rupture of ear drum, right ear: Secondary | ICD-10-CM

## 2019-12-11 DIAGNOSIS — R05 Cough: Secondary | ICD-10-CM | POA: Diagnosis not present

## 2019-12-11 DIAGNOSIS — R432 Parageusia: Secondary | ICD-10-CM

## 2019-12-11 MED ORDER — FLUTICASONE PROPIONATE 50 MCG/ACT NA SUSP
2.0000 | Freq: Every day | NASAL | 0 refills | Status: DC
Start: 2019-12-11 — End: 2020-03-02

## 2019-12-11 MED ORDER — AMOXICILLIN-POT CLAVULANATE 875-125 MG PO TABS
1.0000 | ORAL_TABLET | Freq: Two times a day (BID) | ORAL | 0 refills | Status: DC
Start: 2019-12-11 — End: 2020-01-04

## 2019-12-11 MED ORDER — IPRATROPIUM BROMIDE 0.06 % NA SOLN
2.0000 | Freq: Four times a day (QID) | NASAL | 0 refills | Status: DC
Start: 2019-12-11 — End: 2020-11-30

## 2019-12-11 NOTE — Discharge Instructions (Signed)
Given new symptoms, we are resting you for COVID, please quarantine until testing results return. Augmentin as directed for right ear infection, this will also cover for sinus infection. Start flonase, atrovent nasal spray for nasal congestion/drainage. You can use over the counter nasal saline rinse such as neti pot for nasal congestion. Keep hydrated, your urine should be clear to pale yellow in color. Tylenolfor fever and pain. Monitor for any worsening of symptoms, chest pain, shortness of breath, wheezing, swelling of the throat, go to the emergency department for further evaluation needed.

## 2019-12-11 NOTE — ED Provider Notes (Signed)
[facility]    CSN: [id number] Arrival date & time: 12/11/19  1239      History   Chief Complaint Chief Complaint  Patient presents with   Otalgia   Cough   Nasal Congestion    HPI Michele Carney is a 19 y.o. female.   19 year old female who is currently [redacted] week pregnant comes in for 1 week history of URI symptoms. Cough, rhinorrhea, nasal congestion, sore throat. Fever, tmax 101, responsive to antipyretic and has since resolved. Denies abdominal pain. 2 days ago started having bilateral ear pain, loss of taste/smell. otc cold medicine with some relief.   No vaginal bleeding, good fetal movement.      Past Medical History:  Diagnosis Date   Medical history non-contributory     Patient Active Problem List   Diagnosis Date Noted   Significant discrepancy between uterine size and clinical dates, antepartum, second trimester 11/16/2019   Yeast vaginitis 10/14/2019   GBS (group B streptococcus) UTI complicating pregnancy 08/31/2019   Supervision of normal first pregnancy, antepartum 07/05/2019    Past Surgical History:  Procedure Laterality Date   NO PAST SURGERIES      OB History    Gravida  1   Para      Term      Preterm      AB      Living        SAB      TAB      Ectopic      Multiple      Live Births               Home Medications    Prior to Admission medications   Medication Sig Start Date End Date Taking? Authorizing Provider  amoxicillin-clavulanate (AUGMENTIN) 875-125 MG tablet Take 1 tablet by mouth every 12 (twelve) hours. 12/11/19   Belinda Fisher, PA-C  Blood Pressure Monitoring (BLOOD PRESSURE MONITOR AUTOMAT) DEVI 1 Device by Does not apply route daily. Automatic blood pressure cuff regular size. To monitor blood pressure regularly at home. ICD-10 code: O09.90 07/05/19   Reva Bores, MD  cyclobenzaprine (FLEXERIL) 10 MG tablet Take 1 tablet (10 mg total) by mouth every 8 (eight) hours as needed  for muscle spasms. 08/19/19   Raelyn Mora, CNM  ferrous sulfate 325 (65 FE) MG tablet Take 1 tablet (325 mg total) by mouth every other day. 08/25/19   Leftwich-Kirby, Wilmer Floor, CNM  fluconazole (DIFLUCAN) 150 MG tablet Take 1 tablet (150 mg total) by mouth every 3 (three) days. For three doses Patient not taking: Reported on 11/16/2019 10/14/19   Hermina Staggers, MD  fluticasone [facility]) 50 MCG/ACT nasal spray Place 2 sprays into both nostrils daily. 12/11/19   Cathie Hoops, Karleen Seebeck V, PA-C  ipratropium (ATROVENT) 0.06 % nasal spray Place 2 sprays into both nostrils 4 (four) times daily. 12/11/19   Belinda Fisher, PA-C  Misc. Devices (GOJJI WEIGHT SCALE) MISC 1 Device by Does not apply route daily as needed. To weight self daily as needed at home. ICD-10 code: O09.90 07/05/19   Reva Bores, MD  pantoprazole (PROTONIX) 40 MG tablet Take 1 tablet (40 mg total) by mouth daily. 10/14/19   Hermina Staggers, MD  Prenatal Vit-Fe Fumarate-FA (PRENATAL MULTIVITAMIN) TABS tablet Take 1 tablet by mouth daily at 12 noon.    [provider]    Family History Family History  Problem Relation Age of Onset  Diabetes Maternal Grandmother    Cancer Maternal Grandfather        colon    Social History Social History   Tobacco Use   Smoking status: Never Smoker   Smokeless tobacco: Never Used  Building services engineer Use: Never used  Substance Use Topics   Alcohol use: Not Currently   Drug use: Not Currently    Types: Marijuana     Allergies   Patient has no known allergies.   Review of Systems Review of Systems  Reason unable to perform ROS: See HPI as above.     Physical Exam Triage Vital Signs ED Triage Vitals  Enc Vitals Group     BP 12/11/19 1252 116/74     Pulse Rate 12/11/19 1252 (!) 110     Resp 12/11/19 1252 18     Temp 12/11/19 1252 98.5 F (36.9 C)     Temp Source 12/11/19 1252 Oral     SpO2 12/11/19 1252 96 %     Weight --      Height --      Head Circumference --      Peak  Flow --      Pain Score 12/11/19 1300 7     Pain Loc --      Pain Edu? --      Excl. in GC? --    No data found.  Updated Vital Signs BP 116/74 (BP Location: Left Arm)    Pulse (!) 110    Temp 98.5 F (36.9 C) (Oral)    Resp 18    LMP 04/23/2019    SpO2 96%   Visual Acuity Right Eye Distance:   Left Eye Distance:   Bilateral Distance:    Right Eye Near:   Left Eye Near:    Bilateral Near:     Physical Exam Constitutional:      General: She is not in acute distress.    Appearance: Normal appearance. She is well-developed. She is not ill-appearing, toxic-appearing or diaphoretic.  HENT:     Head: Normocephalic and atraumatic.     Right Ear: Ear canal and external ear normal. Tympanic membrane is erythematous and bulging.     Left Ear: Tympanic membrane, ear canal and external ear normal. Tympanic membrane is not erythematous or bulging.     Nose: Congestion present.     Left Turbinates: Swollen.     Right Sinus: No maxillary sinus tenderness or frontal sinus tenderness.     Left Sinus: No maxillary sinus tenderness or frontal sinus tenderness.     Mouth/Throat:     Mouth: Mucous membranes are moist.     Pharynx: Oropharynx is clear. Uvula midline.  Eyes:     Conjunctiva/sclera: Conjunctivae normal.     Pupils: Pupils are equal, round, and reactive to light.  Cardiovascular:     Rate and Rhythm: Regular rhythm. Tachycardia present.     Comments: Improved at 103 Pulmonary:     Effort: Pulmonary effort is normal. No accessory muscle usage, prolonged expiration, respiratory distress or retractions.     Breath sounds: No decreased air movement or transmitted upper airway sounds. No decreased breath sounds.     Comments: LCTAB Musculoskeletal:     Cervical back: Normal range of motion and neck supple.  Skin:    General: Skin is warm and dry.  Neurological:     Mental Status: She is alert and oriented to person, place, and time.      UC  Treatments / Results  Labs (all  labs ordered are listed, but only abnormal results are displayed) Labs Reviewed  NOVEL CORONAVIRUS, NAA    EKG   Radiology No results found.  Procedures Procedures (including critical care time)  Medications Ordered in UC Medications - No data to display  Initial Impression / Assessment and Plan / UC Course  I have reviewed the triage vital signs and the nursing notes.  Pertinent labs & imaging results that were available during my care of the patient were reviewed by me and considered in my medical decision making (see chart for details).    Patient was Covid tested day after symptoms started with negative results.  However, since then, has had fever, loss of taste and smell, will retest for Covid.  Patient to quarantine until testing results return.  Patient is slightly tachycardic, without chest pain or shortness of breath, low suspicion for PE at this time.  Will treat for otitis media, and cover for bacterial sinusitis with Augmentin.  Other symptomatic treatment discussed.  Return precautions given.  Patient expresses understanding and agrees to plan.  Final Clinical Impressions(s) / UC Diagnoses   Final diagnoses:  Cough  Loss of taste  Non-recurrent acute suppurative otitis media of right ear without spontaneous rupture of tympanic membrane    ED Prescriptions    Medication Sig Dispense Auth. Provider   amoxicillin-clavulanate (AUGMENTIN) 875-125 MG tablet Take 1 tablet by mouth every 12 (twelve) hours. 14 tablet Nevea Spiewak V, PA-C   ipratropium (ATROVENT) 0.06 % nasal spray Place 2 sprays into both nostrils 4 (four) times daily. 15 mL Delfino Friesen V, PA-C   fluticasone (FLONASE) 50 MCG/ACT nasal spray Place 2 sprays into both nostrils daily. 1 g Belinda Fisher, PA-C     PDMP not reviewed this encounter.   Belinda Fisher, PA-C 12/11/19 1321

## 2019-12-11 NOTE — ED Triage Notes (Signed)
Patient presents for evaluation of bilateral ear pain, worse on the right side accompanied by productive cough (yellow-green) and headache over the past two days.

## 2019-12-12 LAB — SARS-COV-2, NAA 2 DAY TAT

## 2019-12-12 LAB — NOVEL CORONAVIRUS, NAA: SARS-CoV-2, NAA: NOT DETECTED

## 2019-12-14 ENCOUNTER — Other Ambulatory Visit: Payer: Self-pay

## 2019-12-14 ENCOUNTER — Ambulatory Visit (INDEPENDENT_AMBULATORY_CARE_PROVIDER_SITE_OTHER): Payer: Medicaid Other | Admitting: Obstetrics

## 2019-12-14 ENCOUNTER — Encounter: Payer: Self-pay | Admitting: Obstetrics

## 2019-12-14 ENCOUNTER — Ambulatory Visit (INDEPENDENT_AMBULATORY_CARE_PROVIDER_SITE_OTHER): Payer: Medicaid Other | Admitting: Licensed Clinical Social Worker

## 2019-12-14 DIAGNOSIS — Z3400 Encounter for supervision of normal first pregnancy, unspecified trimester: Secondary | ICD-10-CM

## 2019-12-14 DIAGNOSIS — Z3A31 31 weeks gestation of pregnancy: Secondary | ICD-10-CM

## 2019-12-14 DIAGNOSIS — F419 Anxiety disorder, unspecified: Secondary | ICD-10-CM | POA: Diagnosis not present

## 2019-12-14 DIAGNOSIS — Z3A00 Weeks of gestation of pregnancy not specified: Secondary | ICD-10-CM

## 2019-12-14 DIAGNOSIS — O99340 Other mental disorders complicating pregnancy, unspecified trimester: Secondary | ICD-10-CM | POA: Diagnosis not present

## 2019-12-14 DIAGNOSIS — Z3403 Encounter for supervision of normal first pregnancy, third trimester: Secondary | ICD-10-CM

## 2019-12-14 NOTE — Progress Notes (Signed)
Subjective:  Michele Carney is a 19 y.o. G1P0 at [redacted]w[redacted]d being seen today for ongoing prenatal care.  She is currently monitored for the following issues for this low-risk pregnancy and has Supervision of normal first pregnancy, antepartum; GBS (group B streptococcus) UTI complicating pregnancy; Yeast vaginitis; and Significant discrepancy between uterine size and clinical dates, antepartum, second trimester on their problem list.  Patient reports no complaints.  Contractions: Irritability. Vag. Bleeding: None.  Movement: Present. Denies leaking of fluid.   The following portions of the patient's history were reviewed and updated as appropriate: allergies, current medications, past family history, past medical history, past social history, past surgical history and problem list. Problem list updated.  Objective:   Vitals:   12/14/19 0910  BP: 111/71  Pulse: (!) 116  Weight: (!) 210 lb (95.3 kg)    Fetal Status:     Movement: Present     General:  Alert, oriented and cooperative. Patient is in no acute distress.  Skin: Skin is warm and dry. No rash noted.   Cardiovascular: Normal heart rate noted  Respiratory: Normal respiratory effort, no problems with respiration noted  Abdomen: Soft, gravid, appropriate for gestational age. Pain/Pressure: Present     Pelvic:  Cervical exam deferred        Extremities: Normal range of motion.  Edema: None  Mental Status: Normal mood and affect. Normal behavior. Normal judgment and thought content.   Urinalysis:      Assessment and Plan:  Pregnancy: G1P0 at [redacted]w[redacted]d  1. Supervision of normal first pregnancy, antepartum   Preterm labor symptoms and general obstetric precautions including but not limited to vaginal bleeding, contractions, leaking of fluid and fetal movement were reviewed in detail with the patient. Please refer to After Visit Summary for other counseling recommendations.   Return in about 2 weeks (around 12/28/2019) for  MyChart.   Brock Bad, MD  12/14/19

## 2019-12-14 NOTE — Progress Notes (Signed)
Pt is here for ROB, [redacted]w[redacted]d.

## 2019-12-15 NOTE — BH Specialist Note (Signed)
Integrated Behavioral Health Follow Up Visit  MRN: [id number] Name: Michele Carney [facility]  Number of Integrated Behavioral Health Clinician visits: 3 Session Start time: 9:30am  Session End time: 9:51pm Total time: 21 mins in person at [facility]   Type of Service: Integrated Behavioral Health- Individual Interpretor:none  Interpretor Name and Language: none  SUBJECTIVE: Michele Carney is a 19 y.o. female accompanied by n/a Patient was referred by R. Arita Miss CNM for phq9. Patient reports the following symptoms/concerns: irritability  Duration of problem: since pregnancy; Severity of problem: mild   OBJECTIVE: Mood: Good  and Affect: Normal  Risk of harm to self or others: No risk of harm to self or others.   LIFE CONTEXT: Family and Social: Lives with mother  School/Work: Attending [employer]  Self-Care: n/a Life Changes: New pregnancy    GOALS ADDRESSED: Patient will: 1.  Reduce symptoms of: irritability  2.  Increase knowledge and/or ability of: diagnosis and implement coping skills to alleviate symptoms  3.  Demonstrate ability to: self manage symptoms   INTERVENTIONS: Interventions utilized:  Supportive counseling  Standardized Assessments completed: n/a  ASSESSMENT: Patient currently experiencing anxiety affecting pregnancy  Patient may benefit from integrated behavioral health   PLAN: 1. Follow up with behavioral health clinician on : 01/04/2020 2. Behavioral recommendations: Attend waterbirth class 01/19/2020, continue taking prenatal vitamins, engage in stress reducing activities such as walking, physical activity and prioritize sleep 3. Referral(s): Childbirth education for waterbirth  4. "From scale of 1-10, how likely are you to follow plan?":   Gwyndolyn Saxon, LCSW

## 2019-12-21 ENCOUNTER — Other Ambulatory Visit: Payer: Self-pay

## 2019-12-21 ENCOUNTER — Ambulatory Visit: Payer: Medicaid Other | Admitting: *Deleted

## 2019-12-21 ENCOUNTER — Ambulatory Visit: Payer: Medicaid Other | Attending: Obstetrics and Gynecology

## 2019-12-21 DIAGNOSIS — O3660X0 Maternal care for excessive fetal growth, unspecified trimester, not applicable or unspecified: Secondary | ICD-10-CM | POA: Diagnosis not present

## 2019-12-21 DIAGNOSIS — Z3400 Encounter for supervision of normal first pregnancy, unspecified trimester: Secondary | ICD-10-CM | POA: Insufficient documentation

## 2019-12-21 DIAGNOSIS — O99213 Obesity complicating pregnancy, third trimester: Secondary | ICD-10-CM

## 2019-12-21 DIAGNOSIS — Z3A32 32 weeks gestation of pregnancy: Secondary | ICD-10-CM

## 2019-12-21 DIAGNOSIS — O2342 Unspecified infection of urinary tract in pregnancy, second trimester: Secondary | ICD-10-CM | POA: Insufficient documentation

## 2019-12-21 DIAGNOSIS — E669 Obesity, unspecified: Secondary | ICD-10-CM

## 2019-12-21 DIAGNOSIS — Z361 Encounter for antenatal screening for raised alphafetoprotein level: Secondary | ICD-10-CM | POA: Diagnosis not present

## 2019-12-21 DIAGNOSIS — B951 Streptococcus, group B, as the cause of diseases classified elsewhere: Secondary | ICD-10-CM | POA: Diagnosis present

## 2019-12-28 ENCOUNTER — Other Ambulatory Visit: Payer: Self-pay

## 2019-12-28 ENCOUNTER — Ambulatory Visit (INDEPENDENT_AMBULATORY_CARE_PROVIDER_SITE_OTHER): Payer: Medicaid Other | Admitting: Advanced Practice Midwife

## 2019-12-28 ENCOUNTER — Other Ambulatory Visit (HOSPITAL_COMMUNITY)
Admission: RE | Admit: 2019-12-28 | Discharge: 2019-12-28 | Disposition: A | Discharge status: Home / Self Care | Payer: Medicaid Other | Source: Ambulatory Visit | Attending: Advanced Practice Midwife | Admitting: Advanced Practice Midwife

## 2019-12-28 VITALS — BP 112/69 | HR 119 | Temp 99.0°F | Wt 213.0 lb

## 2019-12-28 DIAGNOSIS — O26842 Uterine size-date discrepancy, second trimester: Secondary | ICD-10-CM

## 2019-12-28 DIAGNOSIS — O99891 Other specified diseases and conditions complicating pregnancy: Secondary | ICD-10-CM

## 2019-12-28 DIAGNOSIS — Z3400 Encounter for supervision of normal first pregnancy, unspecified trimester: Secondary | ICD-10-CM

## 2019-12-28 DIAGNOSIS — Z3A33 33 weeks gestation of pregnancy: Secondary | ICD-10-CM

## 2019-12-28 DIAGNOSIS — N898 Other specified noninflammatory disorders of vagina: Secondary | ICD-10-CM

## 2019-12-28 DIAGNOSIS — O98819 Other maternal infectious and parasitic diseases complicating pregnancy, unspecified trimester: Secondary | ICD-10-CM

## 2019-12-28 DIAGNOSIS — O26893 Other specified pregnancy related conditions, third trimester: Secondary | ICD-10-CM | POA: Diagnosis present

## 2019-12-28 DIAGNOSIS — O2342 Unspecified infection of urinary tract in pregnancy, second trimester: Secondary | ICD-10-CM

## 2019-12-28 DIAGNOSIS — B3731 Acute candidiasis of vulva and vagina: Secondary | ICD-10-CM

## 2019-12-28 DIAGNOSIS — M549 Dorsalgia, unspecified: Secondary | ICD-10-CM

## 2019-12-28 DIAGNOSIS — B373 Candidiasis of vulva and vagina: Secondary | ICD-10-CM

## 2019-12-28 DIAGNOSIS — B951 Streptococcus, group B, as the cause of diseases classified elsewhere: Secondary | ICD-10-CM

## 2019-12-28 MED ORDER — FLUCONAZOLE 150 MG PO TABS: 150.0000 mg | ORAL_TABLET | ORAL | 0 refills | Status: DC

## 2019-12-28 MED ORDER — COMFORT FIT MATERNITY SUPP MED MISC: 1.0000 | Freq: Every day | 0 refills | Status: DC

## 2019-12-28 NOTE — Progress Notes (Addendum)
   PRENATAL VISIT NOTE  Subjective:  Michele Carney is a 19 y.o. G1P0 at [redacted]w[redacted]d being seen today for ongoing prenatal care.  She is currently monitored for the following issues for this low-risk pregnancy and has Supervision of normal first pregnancy, antepartum; GBS (group B streptococcus) UTI complicating pregnancy; Yeast vaginitis; and Significant discrepancy between uterine size and clinical dates, antepartum, second trimester on their problem list.  Patient reports vaginal wetness, on her thighs, but on on the bed under her when she woke up.  Contractions: Irritability. Vag. Bleeding: None.  Movement: Present.    The following portions of the patient's history were reviewed and updated as appropriate: allergies, current medications, past family history, past medical history, past social history, past surgical history and problem list.   Objective:   Vitals:   12/28/19 1129  BP: 112/69  Pulse: (!) 119  Temp: 99 F (37.2 C)  Weight: 213 lb (96.6 kg)    Fetal Status: Fetal Heart Rate (bpm): 142   Movement: Present     General:  Alert, oriented and cooperative. Patient is in no acute distress.  Skin: Skin is warm and dry. No rash noted.   Cardiovascular: Normal heart rate noted  Respiratory: Normal respiratory effort, no problems with respiration noted  Abdomen: Soft, gravid, appropriate for gestational age.  Pain/Pressure: Present     Pelvic: Cervical exam deferred        Extremities: Normal range of motion.  Edema: Trace  Mental Status: Normal mood and affect. Normal behavior. Normal judgment and thought content.   Assessment and Plan:  Pregnancy: G1P0 at [redacted]w[redacted]d  1. Supervision of normal first pregnancy, antepartum --Anticipatory guidance about next visits/weeks of pregnancy given. --Next visit in 3 weeks.  Does not need GBS with positive GBS UTI but will need GCC.  2. Significant discrepancy between uterine size and clinical dates, antepartum, second trimester --[place]  on 8/3  with EFW 2277 g, 5 lb, 85  %tile  3. Group B Streptococcus urinary tract infection affecting pregnancy in second trimester --Prophylaxis in labor  4. Vaginal discharge during pregnancy in third trimester --Exam today c/w yeast, no evidence of ROM with negative pooling, nitrazine and ferning. - Cervicovaginal ancillary only  5. Back pain affecting pregnancy in third trimester --Rest/ice/heat/warm bath/Tylenol/pregnancy support belt  - Elastic Bandages & Supports (COMFORT FIT MATERNITY SUPP MED) MISC; 1 Device by Does not apply route daily.  Dispense: 1 each; Refill: 0  6. Candidiasis of vagina during pregnancy  - fluconazole (DIFLUCAN) 150 MG tablet; Take 1 tablet (150 mg total) by mouth every 3 (three) days. For two doses  Dispense: 2 tablet; Refill: 0 - fluconazole (DIFLUCAN) 150 MG tablet; Take 1 tablet (150 mg total) by mouth every 3 (three) days. For two doses  Dispense: 2 tablet; Refill: 0  7. [redacted] weeks gestation of pregnancy  Preterm labor symptoms and general obstetric precautions including but not limited to vaginal bleeding, contractions, leaking of fluid and fetal movement were reviewed in detail with the patient. Please refer to After Visit Summary for other counseling recommendations.   No follow-ups on file.  Future Appointments  Date Time Provider Department Center  01/04/2020 10:00 AM Gwyndolyn Saxon, LCSW [facility] None    Sharen Counter, CNM

## 2019-12-28 NOTE — Progress Notes (Signed)
Pt is here for ROB, [redacted]w[redacted]d.  

## 2019-12-29 LAB — CERVICOVAGINAL ANCILLARY ONLY
Candida Glabrata: NEGATIVE
Candida Vaginitis: POSITIVE — AB
Comment: NEGATIVE
Comment: NEGATIVE

## 2020-01-04 ENCOUNTER — Other Ambulatory Visit: Payer: Self-pay

## 2020-01-04 ENCOUNTER — Encounter (HOSPITAL_COMMUNITY): Payer: Self-pay | Admitting: Obstetrics and Gynecology

## 2020-01-04 ENCOUNTER — Encounter: Payer: Medicaid Other | Admitting: Licensed Clinical Social Worker

## 2020-01-04 ENCOUNTER — Inpatient Hospital Stay (HOSPITAL_COMMUNITY)
Admission: AD | Admit: 2020-01-04 | Discharge: 2020-01-04 | Disposition: A | Discharge status: Home / Self Care | Payer: Medicaid Other | Attending: Obstetrics and Gynecology | Admitting: Obstetrics and Gynecology

## 2020-01-04 DIAGNOSIS — D509 Iron deficiency anemia, unspecified: Secondary | ICD-10-CM | POA: Diagnosis not present

## 2020-01-04 DIAGNOSIS — N898 Other specified noninflammatory disorders of vagina: Secondary | ICD-10-CM

## 2020-01-04 DIAGNOSIS — O4703 False labor before 37 completed weeks of gestation, third trimester: Secondary | ICD-10-CM | POA: Insufficient documentation

## 2020-01-04 DIAGNOSIS — Z3A34 34 weeks gestation of pregnancy: Secondary | ICD-10-CM

## 2020-01-04 DIAGNOSIS — O99013 Anemia complicating pregnancy, third trimester: Secondary | ICD-10-CM | POA: Diagnosis not present

## 2020-01-04 DIAGNOSIS — M549 Dorsalgia, unspecified: Secondary | ICD-10-CM

## 2020-01-04 DIAGNOSIS — O99019 Anemia complicating pregnancy, unspecified trimester: Secondary | ICD-10-CM

## 2020-01-04 DIAGNOSIS — O99891 Other specified diseases and conditions complicating pregnancy: Secondary | ICD-10-CM | POA: Diagnosis not present

## 2020-01-04 DIAGNOSIS — M545 Low back pain: Secondary | ICD-10-CM | POA: Insufficient documentation

## 2020-01-04 DIAGNOSIS — Z3400 Encounter for supervision of normal first pregnancy, unspecified trimester: Secondary | ICD-10-CM

## 2020-01-04 LAB — CBC WITH DIFFERENTIAL/PLATELET
Abs Immature Granulocytes: 0.06 10*3/uL (ref 0.00–0.07)
Basophils Absolute: 0 10*3/uL (ref 0.0–0.1)
Basophils Relative: 0 %
Eosinophils Absolute: 0.1 10*3/uL (ref 0.0–0.5)
Eosinophils Relative: 1 %
HCT: 30.3 % — ABNORMAL LOW (ref 36.0–46.0)
Hemoglobin: 9.5 g/dL — ABNORMAL LOW (ref 12.0–15.0)
Immature Granulocytes: 1 %
Lymphocytes Relative: 22 %
Lymphs Abs: 2.5 10*3/uL (ref 0.7–4.0)
MCH: 24.2 pg — ABNORMAL LOW (ref 26.0–34.0)
MCHC: 31.4 g/dL (ref 30.0–36.0)
MCV: 77.3 fL — ABNORMAL LOW (ref 80.0–100.0)
Monocytes Absolute: 0.6 10*3/uL (ref 0.1–1.0)
Monocytes Relative: 5 %
Neutro Abs: 7.9 10*3/uL — ABNORMAL HIGH (ref 1.7–7.7)
Neutrophils Relative %: 71 %
Platelets: 221 10*3/uL (ref 150–400)
RBC: 3.92 MIL/uL (ref 3.87–5.11)
RDW: 14.5 % (ref 11.5–15.5)
WBC: 11.1 10*3/uL — ABNORMAL HIGH (ref 4.0–10.5)
nRBC: 0 % (ref 0.0–0.2)

## 2020-01-04 LAB — URINALYSIS, ROUTINE W REFLEX MICROSCOPIC
Bilirubin Urine: NEGATIVE
Glucose, UA: NEGATIVE mg/dL
Hgb urine dipstick: NEGATIVE
Ketones, ur: NEGATIVE mg/dL
Leukocytes,Ua: NEGATIVE
Nitrite: NEGATIVE
Protein, ur: NEGATIVE mg/dL
Specific Gravity, Urine: 1.009 (ref 1.005–1.030)
pH: 7 (ref 5.0–8.0)

## 2020-01-04 LAB — WET PREP, GENITAL
Clue Cells Wet Prep HPF POC: NONE SEEN
Sperm: NONE SEEN
Trich, Wet Prep: NONE SEEN
Yeast Wet Prep HPF POC: NONE SEEN

## 2020-01-04 MED ORDER — PRENATAL ADULT GUMMY/DHA/FA 0.4-25 MG PO CHEW: 1.0000 | CHEWABLE_TABLET | Freq: Every day | ORAL | 2 refills | Status: DC

## 2020-01-04 MED ORDER — FERROUS SULFATE 325 (65 FE) MG PO TABS
325.0000 mg | ORAL_TABLET | ORAL | 2 refills | Status: DC
Start: 2020-01-04 — End: 2020-11-30

## 2020-01-04 NOTE — MAU Provider Note (Signed)
History     Patient Active Problem List   Diagnosis Date Noted  . Significant discrepancy between uterine size and clinical dates, antepartum, second trimester 11/16/2019  . Yeast vaginitis 10/14/2019  . GBS (group B streptococcus) UTI complicating pregnancy 08/31/2019  . Supervision of normal first pregnancy, antepartum 07/05/2019    Chief Complaint  Patient presents with  . Back Pain   Michele Carney is a 19 y.o. G1P0 at [redacted]w[redacted]d who presents to MAU after two days of intermittent low back pain that radiates to the front and causes hot flashes and nausea. She said they last 2-13min and happen very sporadically. It happened 3x today, but not at all since she came to MAU. She reports general wetness and is not sure her yeast treatment worked, as well as pelvic pressure. She is not wearing her maternity belt and ran out of prenatals so has not been taking.   Back Pain This is a new problem. The current episode started in the past 7 days. The problem occurs 2 to 4 times per day. The problem has been gradually improving since onset. The pain is present in the lumbar spine. The quality of the pain is described as aching. Radiates to: lower abdomen. The pain is at a severity of 3/10. The pain is mild. The pain is the same all the time. She has tried nothing for the symptoms.    OB History    Gravida  1   Para      Term      Preterm      AB      Living  0     SAB      TAB      Ectopic      Multiple      Live Births              Past Medical History:  Diagnosis Date  . Medical history non-contributory     Past Surgical History:  Procedure Laterality Date  . NO PAST SURGERIES      Family History  Problem Relation Age of Onset  . Diabetes Maternal Grandmother   . Cancer Maternal Grandfather        colon    Social History   Tobacco Use  . Smoking status: Never Smoker  . Smokeless tobacco: Never Used  Vaping Use  . Vaping Use: Never used  Substance  Use Topics  . Alcohol use: Not Currently  . Drug use: Not Currently    Types: Marijuana    Allergies: No Known Allergies  Medications Prior to Admission  Medication Sig Dispense Refill Last Dose  . Prenatal Vit-Fe Fumarate-FA (PRENATAL MULTIVITAMIN) TABS tablet Take 1 tablet by mouth daily at 12 noon.   Past Week at Unknown time  . amoxicillin-clavulanate (AUGMENTIN) 875-125 MG tablet Take 1 tablet by mouth every 12 (twelve) hours. (Patient not taking: Reported on 12/21/2019) 14 tablet 0   . Blood Pressure Monitoring (BLOOD PRESSURE MONITOR AUTOMAT) DEVI 1 Device by Does not apply route daily. Automatic blood pressure cuff regular size. To monitor blood pressure regularly at home. ICD-10 code: O09.90 1 each 0   . cyclobenzaprine (FLEXERIL) 10 MG tablet Take 1 tablet (10 mg total) by mouth every 8 (eight) hours as needed for muscle spasms. (Patient not taking: Reported on 12/14/2019) 30 tablet 1   . Elastic Bandages & Supports (COMFORT FIT MATERNITY SUPP MED) MISC 1 Device by Does not apply route daily. 1 each 0   .  Ferrous Sulfate (IRON PO) Take by mouth.   More than a month at Unknown time  . fluconazole (DIFLUCAN) 150 MG tablet Take 1 tablet (150 mg total) by mouth every 3 (three) days. For two doses 2 tablet 0   . fluticasone (FLONASE) 50 MCG/ACT nasal spray Place 2 sprays into both nostrils daily. (Patient not taking: Reported on 12/21/2019) 1 g 0   . ipratropium (ATROVENT) 0.06 % nasal spray Place 2 sprays into both nostrils 4 (four) times daily. (Patient not taking: Reported on 12/21/2019) 15 mL 0   . Misc. Devices (GOJJI WEIGHT SCALE) MISC 1 Device by Does not apply route daily as needed. To weight self daily as needed at home. ICD-10 code: O09.90 1 each 0   . pantoprazole (PROTONIX) 40 MG tablet Take 1 tablet (40 mg total) by mouth daily. (Patient not taking: Reported on 12/28/2019) 30 tablet 5   . [DISCONTINUED] ferrous sulfate 325 (65 FE) MG tablet Take 1 tablet (325 mg total) by mouth every  other day. (Patient not taking: Reported on 12/14/2019) 30 tablet 5     Review of Systems  Gastrointestinal: Positive for nausea. Negative for vomiting.  Musculoskeletal: Positive for back pain.  All other systems reviewed and are negative.   See HPI Above Physical Exam   Blood pressure 126/69, pulse (!) 101, temperature 98.4 F (36.9 C), temperature source Oral, resp. rate 16, last menstrual period 04/23/2019, SpO2 100 %.  Results for orders placed or performed during the hospital encounter of 01/04/20 (from the past 24 hour(s))  Wet prep, genital     Status: Abnormal   Collection Time: 01/04/20  4:41 PM   Specimen: Vaginal  Result Value Ref Range   Yeast Wet Prep HPF POC NONE SEEN NONE SEEN   Trich, Wet Prep NONE SEEN NONE SEEN   Clue Cells Wet Prep HPF POC NONE SEEN NONE SEEN   WBC, Wet Prep HPF POC FEW (A) NONE SEEN   Sperm NONE SEEN   Urinalysis, Routine w reflex microscopic Urine, Clean Catch     Status: None   Collection Time: 01/04/20  4:46 PM  Result Value Ref Range   Color, Urine YELLOW YELLOW   APPearance CLEAR CLEAR   Specific Gravity, Urine 1.009 1.005 - 1.030   pH 7.0 5.0 - 8.0   Glucose, UA NEGATIVE NEGATIVE mg/dL   Hgb urine dipstick NEGATIVE NEGATIVE   Bilirubin Urine NEGATIVE NEGATIVE   Ketones, ur NEGATIVE NEGATIVE mg/dL   Protein, ur NEGATIVE NEGATIVE mg/dL   Nitrite NEGATIVE NEGATIVE   Leukocytes,Ua NEGATIVE NEGATIVE  CBC with Differential/Platelet     Status: Abnormal   Collection Time: 01/04/20  5:05 PM  Result Value Ref Range   WBC 11.1 (H) 4.0 - 10.5 K/uL   RBC 3.92 3.87 - 5.11 MIL/uL   Hemoglobin 9.5 (L) 12.0 - 15.0 g/dL   HCT 97.3 (L) 36 - 46 %   MCV 77.3 (L) 80.0 - 100.0 fL   MCH 24.2 (L) 26.0 - 34.0 pg   MCHC 31.4 30.0 - 36.0 g/dL   RDW 53.2 99.2 - 42.6 %   Platelets 221 150 - 400 K/uL   nRBC 0.0 0.0 - 0.2 %   Neutrophils Relative % 71 %   Neutro Abs 7.9 (H) 1.7 - 7.7 K/uL   Lymphocytes Relative 22 %   Lymphs Abs 2.5 0.7 - 4.0 K/uL    Monocytes Relative 5 %   Monocytes Absolute 0.6 0 - 1 K/uL   Eosinophils Relative  1 %   Eosinophils Absolute 0.1 0 - 0 K/uL   Basophils Relative 0 %   Basophils Absolute 0.0 0 - 0 K/uL   Immature Granulocytes 1 %   Abs Immature Granulocytes 0.06 0.00 - 0.07 K/uL    Physical Exam Vitals and nursing note reviewed.  Constitutional:      Appearance: Normal appearance.  Eyes:     Pupils: Pupils are equal, round, and reactive to light.  Cardiovascular:     Rate and Rhythm: Normal rate and regular rhythm.     Pulses: Normal pulses.  Pulmonary:     Effort: Pulmonary effort is normal.  Skin:    General: Skin is warm and dry.     Capillary Refill: Capillary refill takes less than 2 seconds.  Neurological:     Mental Status: She is alert and oriented to person, place, and time.  Psychiatric:        Mood and Affect: Mood normal.        Behavior: Behavior normal.        Thought Content: Thought content normal.        Judgment: Judgment normal.   Fetal Tracing: reactive Baseline: 150 Variability: moderate Accelerations: present Decelerations: none Toco: none  Dilation: Closed Effacement (%): Thick Cervical Position: Posterior Exam by:: Tyler Aas, CNM  MAU Course & MDM  - CBC, UA, wet prep (all normal) - Cervix closed and no additional pains/contractions while in MAU - Educated pt on Braxton-Hicks, explained how to use the maternity belt she has ordered, encouraged twice daily   pelvic tilts for proper alignment of the baby - Resent po iron and prenatal gummies to pharmacy  Assessment & Plan: - [redacted] weeks gestation - Braxton-Hicks contractions - Iron deficiency anemia - back pain in pregnancy - leukorrhea  Discharge home in stable condition Preterm labor precautions given [facility] (RN visit for possible UTI on 8/19 canceled)  Edd Arbour, CNM, MSN, [facility] 01/04/20 6:36 PM

## 2020-01-04 NOTE — Discharge Instructions (Signed)
Back Pain in Pregnancy Back pain during pregnancy is common. Back pain may be caused by several factors that are related to changes during your pregnancy. Follow these instructions at home: Managing pain, stiffness, and swelling      If directed, for sudden (acute) back pain, put ice on the painful area. ? Put ice in a plastic bag. ? Place a towel between your skin and the bag. ? Leave the ice on for 20 minutes, 2-3 times per day.  If directed, apply heat to the affected area before you exercise. Use the heat source that your health care provider recommends, such as a moist heat pack or a heating pad. ? Place a towel between your skin and the heat source. ? Leave the heat on for 20-30 minutes. ? Remove the heat if your skin turns bright red. This is especially important if you are unable to feel pain, heat, or cold. You may have a greater risk of getting burned.  If directed, massage the affected area. Activity  Exercise as told by your health care provider. Gentle exercise is the best way to prevent or manage back pain.  Listen to your body when lifting. If lifting hurts, ask for help or bend your knees. This uses your leg muscles instead of your back muscles.  Squat down when picking up something from the floor. Do not bend over.  Only use bed rest for short periods as told by your health care provider. Bed rest should only be used for the most severe episodes of back pain. Standing, sitting, and lying down  Do not stand in one place for long periods of time.  Use good posture when sitting. Make sure your head rests over your shoulders and is not hanging forward. Use a pillow on your lower back if necessary.  Try sleeping on your side, preferably the left side, with a pregnancy support pillow or 1-2 regular pillows between your legs. ? If you have back pain after a night's rest, your bed may be too soft. ? A firm mattress may provide more support for your back during  pregnancy. General instructions  Do not wear high heels.  Eat a healthy diet. Try to gain weight within your health care provider's recommendations.  Use a maternity girdle, elastic sling, or back brace as told by your health care provider.  Take over-the-counter and prescription medicines only as told by your health care provider.  Work with a physical therapist or massage therapist to find ways to manage back pain. Acupuncture or massage therapy may be helpful.  Keep all follow-up visits as told by your health care provider. This is important. Contact a health care provider if:  Your back pain interferes with your daily activities.  You have increasing pain in other parts of your body. Get help right away if:  You develop numbness, tingling, weakness, or problems with the use of your arms or legs.  You develop severe back pain that is not controlled with medicine.  You have a change in bowel or bladder control.  You develop shortness of breath, dizziness, or you faint.  You develop nausea, vomiting, or sweating.  You have back pain that is a rhythmic, cramping pain similar to labor pains. Labor pain is usually 1-2 minutes apart, lasts for about 1 minute, and involves a bearing down feeling or pressure in your pelvis.  You have back pain and your water breaks or you have vaginal bleeding.  You have back pain or numbness  that travels down your leg.  Your back pain developed after you fell.  You develop pain on one side of your back.  You see blood in your urine.  You develop skin blisters in the area of your back pain. Summary  Back pain may be caused by several factors that are related to changes during your pregnancy.  Follow instructions as told by your health care provider for managing pain, stiffness, and swelling.  Exercise as told by your health care provider. Gentle exercise is the best way to prevent or manage back pain.  Take over-the-counter and  prescription medicines only as told by your health care provider.  Keep all follow-up visits as told by your health care provider. This is important. This information is not intended to replace advice given to you by your health care provider. Make sure you discuss any questions you have with your health care provider. Document Revised: 08/25/2018 Document Reviewed: 10/22/2017 Elsevier Patient Education  [place] of Pregnancy  The third trimester is from week 28 through week 40 (months 7 through 9). This trimester is when your unborn baby (fetus) is growing very fast. At the end of the ninth month, the unborn baby is about 20 inches in length. It weighs about 6-10 pounds. Follow these instructions at home: Medicines  Take over-the-counter and prescription medicines only as told by your doctor. Some medicines are safe and some medicines are not safe during pregnancy.  Take a prenatal vitamin that contains at least 600 micrograms (mcg) of folic acid.  If you have trouble pooping (constipation), take medicine that will make your stool soft (stool softener) if your doctor approves. Eating and drinking   Eat regular, healthy meals.  Avoid raw meat and uncooked cheese.  If you get low calcium from the food you eat, talk to your doctor about taking a daily calcium supplement.  Eat four or five small meals rather than three large meals a day.  Avoid foods that are high in fat and sugars, such as fried and sweet foods.  To prevent constipation: ? Eat foods that are high in fiber, like fresh fruits and vegetables, whole grains, and beans. ? Drink enough fluids to keep your pee (urine) clear or pale yellow. Activity  Exercise only as told by your doctor. Stop exercising if you start to have cramps.  Avoid heavy lifting, wear low heels, and sit up straight.  Do not exercise if it is too hot, too humid, or if you are in a place of great height (high  altitude).  You may continue to have sex unless your doctor tells you not to. Relieving pain and discomfort  Wear a good support bra if your breasts are tender.  Take frequent breaks and rest with your legs raised if you have leg cramps or low back pain.  Take warm water baths (sitz baths) to soothe pain or discomfort caused by hemorrhoids. Use hemorrhoid cream if your doctor approves.  If you develop puffy, bulging veins (varicose veins) in your legs: ? Wear support hose or compression stockings as told by your doctor. ? Raise (elevate) your feet for 15 minutes, 3-4 times a day. ? Limit salt in your food. Safety  Wear your seat belt when driving.  Make a list of emergency phone numbers, including numbers for family, friends, the hospital, and police and fire departments. Preparing for your baby's arrival To prepare for the arrival of your baby:  Take prenatal classes.  Practice driving to the hospital.  Visit the hospital and tour the maternity area.  Talk to your work about taking leave once the baby comes.  Pack your hospital bag.  Prepare the baby's room.  Go to your doctor visits.  Buy a rear-facing car seat. Learn how to install it in your car. General instructions  Do not use hot tubs, steam rooms, or saunas.  Do not use any products that contain nicotine or tobacco, such as cigarettes and e-cigarettes. If you need help quitting, ask your doctor.  Do not drink alcohol.  Do not douche or use tampons or scented sanitary pads.  Do not cross your legs for long periods of time.  Do not travel for long distances unless you must. Only do so if your doctor says it is okay.  Visit your dentist if you have not gone during your pregnancy. Use a soft toothbrush to brush your teeth. Be gentle when you floss.  Avoid cat litter boxes and soil used by cats. These carry germs that can cause birth defects in the baby and can cause a loss of your baby (miscarriage) or  stillbirth.  Keep all your prenatal visits as told by your doctor. This is important. Contact a doctor if:  You are not sure if you are in labor or if your water has broken.  You are dizzy.  You have mild cramps or pressure in your lower belly.  You have a nagging pain in your belly area.  You continue to feel sick to your stomach, you throw up, or you have watery poop.  You have bad smelling fluid coming from your vagina.  You have pain when you pee. Get help right away if:  You have a fever.  You are leaking fluid from your vagina.  You are spotting or bleeding from your vagina.  You have severe belly cramps or pain.  You lose or gain weight quickly.  You have trouble catching your breath and have chest pain.  You notice sudden or extreme puffiness (swelling) of your face, hands, ankles, feet, or legs.  You have not felt the baby move in over an hour.  You have severe headaches that do not go away with medicine.  You have trouble seeing.  You are leaking, or you are having a gush of fluid, from your vagina before you are 37 weeks.  You have regular belly spasms (contractions) before you are 37 weeks. Summary  The third trimester is from week 28 through week 40 (months 7 through 9). This time is when your unborn baby is growing very fast.  Follow your doctor's advice about medicine, food, and activity.  Get ready for the arrival of your baby by taking prenatal classes, getting all the baby items ready, preparing the baby's room, and visiting your doctor to be checked.  Get help right away if you are bleeding from your vagina, or you have chest pain and trouble catching your breath, or if you have not felt your baby move in over an hour. This information is not intended to replace advice given to you by your health care provider. Make sure you discuss any questions you have with your health care provider. Document Revised: 08/27/2018 Document Reviewed:  06/11/2016 Elsevier Patient Education  2020 [employer].

## 2020-01-04 NOTE — MAU Note (Signed)
Pt presents with lower back pain, watery discharge, nausea x 4 days. Reports back pain comes and goes but she has nausea when the pain starts

## 2020-01-06 ENCOUNTER — Ambulatory Visit: Payer: Medicaid Other

## 2020-01-18 ENCOUNTER — Other Ambulatory Visit: Payer: Self-pay

## 2020-01-18 ENCOUNTER — Ambulatory Visit (INDEPENDENT_AMBULATORY_CARE_PROVIDER_SITE_OTHER): Payer: Medicaid Other

## 2020-01-18 ENCOUNTER — Other Ambulatory Visit (HOSPITAL_COMMUNITY)
Admission: RE | Admit: 2020-01-18 | Discharge: 2020-01-18 | Disposition: A | Discharge status: Home / Self Care | Payer: Medicaid Other | Source: Ambulatory Visit

## 2020-01-18 VITALS — BP 120/77 | HR 117 | Wt 218.6 lb

## 2020-01-18 DIAGNOSIS — N898 Other specified noninflammatory disorders of vagina: Secondary | ICD-10-CM | POA: Insufficient documentation

## 2020-01-18 DIAGNOSIS — Z3A36 36 weeks gestation of pregnancy: Secondary | ICD-10-CM

## 2020-01-18 DIAGNOSIS — O99019 Anemia complicating pregnancy, unspecified trimester: Secondary | ICD-10-CM

## 2020-01-18 DIAGNOSIS — D509 Iron deficiency anemia, unspecified: Secondary | ICD-10-CM

## 2020-01-18 DIAGNOSIS — R8271 Bacteriuria: Secondary | ICD-10-CM

## 2020-01-18 DIAGNOSIS — Z3400 Encounter for supervision of normal first pregnancy, unspecified trimester: Secondary | ICD-10-CM

## 2020-01-18 MED ORDER — TERCONAZOLE 0.4 % VA CREA: 1.0000 | TOPICAL_CREAM | Freq: Every day | VAGINAL | 0 refills | Status: DC

## 2020-01-18 NOTE — Patient Instructions (Signed)
AREA PEDIATRIC/FAMILY PRACTICE PHYSICIANS  [place] ([address]) . [facility] o Chambliss, MD; Eniola, MD; Hale, MD; Hensel, MD; McDiarmid, MD; McIntyer, MD; Neal, MD; Walden, MD o [address] [place], [place] [address] o [phone] o Mon-Fri 8:30-12:30, 1:30-5:00 o Providers come to see babies at [facility] o Accepting Medicaid . [facility] o Limited providers who accept newborns: Koirala, MD; Morrow, MD; Wolters, MD o [address], [place], [place] [address] o [phone] o Mon-Fri 8:00-5:30 o Babies seen by providers at [facility] o Does NOT accept Medicaid o Please call early in hospitalization for appointment (limited availability)  . [facility] o Mulberry, MD o [address] [place], [place] [address] o [phone] o Mon, Tue, Thur, Fri 8:30-5:00, Wed 10:00-7:00 (closed 1-2pm) o Babies seen by [facility] providers o Accepting Medicaid . [facility] o Rubin, MD o [address], [place], [place] [address] o [phone] o Mon-Fri 8:30-5:00, Sat 8:30-12:00 o Provider comes to see babies at [facility] o Accepting Medicaid o Must have been referred from current patients or contacted office prior to delivery . [facility] ([facility]) o Brown, MD; Chandler, MD; Ettefagh, MD; Grant, MD; Lester, MD; McCormick, MD; McQueen, MD; Prose, MD; Simha, MD; Stanley, MD; Stryffeler, NP; Tebben, NP o [address], [place], [place] [address] o [phone] o Mon, Tue, Thur, Fri 8:30-5:30, Wed 9:30-5:30, Sat 8:30-12:30 o Babies seen by [facility] providers o Accepting Medicaid o Only accepting infants of first-time parents or siblings of current patients o Hospital discharge coordinator will make follow-up appointment . Jack Amos o [address],  [place], [place]  [address] o [phone]   Fax - [phone] . [facility] o [address], [place], [place]  [address] o Phone - [phone]   Fax - [phone] . Shilpa Gosrani o [address], [place], [place]  [address] o [phone]  [place] ([address]) . [facility] o Bates, MD; [place], MD; Cooper, Cox, MD; MD; Davis, MD; Dovico, MD; Ettefaugh, MD; Little, MD; Lowe, MD; Keiffer, MD; Melvin, MD; Sumner, MD; Williams, MD o [address], [place], [place] [address] o [phone] o Mon-Fri 8:30-5:00 (extended evenings Mon-Thur as needed), Sat-Sun 10:00-1:00 o Providers come to see babies at [facility] o Accepting Medicaid for families of first-time babies and families with all children in the household age 3 and under. Must register with office prior to making appointment (M-F only). . [facility] o Henson, NP; Knapp, MD; Lalonde, MD; Tysinger, PA o [address] [place], [place] [address] o [phone] o Mon-Fri 8:00-5:00 o Babies seen by providers at [facility] o Does NOT accept Medicaid/Commercial Insurance Only . [facility] ([facility])  o Artis, MD; Barnes, MD; Bratton, MD; Coccaro, MD; Lockett Gardner, MD; Kramer, MD; Marshall, MD; Netherton, MD; Poleto, MD; Skinner, MD o [address] [place],  [address] o [phone] o Mon-Fri 8:30-5:30, Sat (Oct.-Mar.) 9:00-1:00 o Babies seen by providers at [facility] o Accepting Medicaid  [place] ([address]) . [facility] o Reid, MD; Warner, MD o [address], [place],  [address] o [phone] o Mon-Fri 8:30-5:00, Sat 8:30-12:00 o Providers come to see babies at [facility] o Does NOT accept Medicaid . [facility] o Becker, PA; Hagler, MD; Scifres, PA; Sun, MD; Swayne, MD o [address],  [place], [place] [address] o [phone] o Mon-Fri 8:00-5:00 o Babies seen by providers at [facility] o Does NOT accept Medicaid o Only accepting babies of parents who are patients o Please call early in hospitalization for appointment (limited availability) . [facility] o Clark, MD; Frye, MD; Kelleher, MD; Mack, NP; Miller, MD; O'Keller, MD; Patterson, NP; Pudlo, MD; Puzio, MD; Thomas, MD; Tucker, MD; Twiselton, MD o [address], [place], [place] [address] o [phone] o Mon-Fri 8:00-5:00, Sat 9:00-12:00 o Providers come to see babies at [facility] o Does NOT accept Medicaid  [place] ([address]) . [facility] o Limited providers accepting new patients: Brake, NP; Wharton, PA o [address], [place], [place] [address] o [phone] o Mon-Fri 8:00-5:00 o Babies seen by providers at [facility] o Does NOT accept Medicaid o Only accepting babies of parents who are patients o Please call early in hospitalization for appointment (limited availability) . [facility] o Gay, MD; Quinlan, MD o [address] [place], [place] [address] o [phone] (press 1 to schedule appointment) o Mon-Fri 8:00-5:00 o Providers come to see babies at [facility] o Does NOT accept Medicaid . [facility] o Mazer, MD o [address] [place], [place] [address] o [phone] o Mon-Fri 8:30-5:00 (lunch 12:30-1:00), extended hours by appointment only Wed 5:00-6:30 o Babies seen by [facility] providers o Accepting Medicaid . [facility] o Banks, MD; Jordan, MD; Koberlein, MD o [address], [place], [place] [address] o [phone] o Mon-Fri 8:00-5:00 o Babies seen by [facility] providers o Does NOT accept Medicaid . [facility] o Parker, MD; Hunter, MD; Wallace, DO o [address] [place], [place]  [address] o [phone] o Mon-Fri 8:00-5:00 o Babies seen by [facility] providers o Does NOT accept Medicaid . [facility] o Brandon, PA; Brecken, PA; Christy, NP; Dees, MD; DeClaire, MD; DeWeese, MD; Hansen, NP; Mills, NP; Parrish, NP; Smoot, NP; Summer, MD; Vapne, MD o [address] [place], [place] [address] o [phone] o Mon-Fri 8:30-5:00, Sat 10:00-1:00 o Providers come to see babies at [facility] o Does NOT accept Medicaid o Free prenatal information session Tuesdays at 4:45pm . [facility] o Bouska, MD; Gordon, PA; Jeffery, PA; Weber, PA o [address] [place] [address] o [phone] o Mon-Fri 7:30-5:30 o Babies seen by [facility] providers . [facility] o [address], [place], [place]  [address] o [phone]   Fax - [phone]  [place] ([address] & [address]) . [facility] o [place]ese, MD o [address] [place], [place] [address] o [phone] o Mon-Thur 8:00-6:00 o Providers come to see babies at [facility] o Accepting Medicaid . [facility] o Anderson, NP; Badger, MD; Beal, PA; Spencer, PA o [address] [place], [place] [address] o [phone] o Mon-Thur 7:30-7:30, Fri 7:30-4:30 o Babies seen by [facility] providers o Accepting Medicaid . [facility] o Agbuya, MD; Klett, NP; Romgoolam, MD o [address] [place] [address], [place], [place] [address] o [phone] o Mon-Fri 8:30-5:00, Sat 8:30-12:00 o Providers come to see babies at [facility] o Accepting Medicaid o Must have "Meet & Greet" appointment at office prior to delivery . [facility] ([facility] o McCord,   MD; Wallace, MD; Wood, MD o [address] [place] [address], [place], [place] [address] o [phone] o Mon-Wed 8:00-6:00, Thur-Fri 8:00-5:00, Sat 9:00-12:00 o Providers come to  see babies at [facility] o Does NOT accept Medicaid o Only accepting siblings of current patients . [facility]  o [address], [place], [place]  [address] o [phone]   Fax - [phone] . [facility] o [address], [place], [place]  [address] o [phone]   Fax - [phone]  [place] ([address] & [address]) . [facility] o Cirigliano, DO; Matthews, DO o [address] [place], [place] [address] o [phone] o Mon-Fri 7:00-5:00 o Babies seen by [facility] providers o Does NOT accept Medicaid . [facility] o Briscoe, MD; Howley, PA; Moreira, PA o [address], [place], [place] [address] o [phone] o Mon-Fri 8:00-5:00 o Babies seen by [facility] providers o Accepting Medicaid . [facility] o Boyd, MD; Church, PA; Jones, NP; Osborn, PA o [address], [place], [place] [address] o [phone] o Mon-Fri 8:00-5:00 o Babies seen by providers at [facility] o Accepting Medicaid  [place] ([address]) . [facility] [place] o Wendling, DO o [address] [place], [place] [address] o [phone] o Mon-Fri 8:00-5:00 o Babies seen by [facility] providers o Does NOT accept Medicaid o Limited availability, please call early in hospitalization to schedule follow-up . [facility] o Calderon, PA; Cummings, MD; Dillard, MD; Martin, PA; Olson, MD; VanDeven, PA o [address], [place], [place] [address] o [phone] o Mon-Fri 8:30-5:00, Sat 9:00-12:00 o Babies seen by providers at [facility] o Accepting Medicaid o Please register online then schedule online or call office o [email or web address] . [facility] ([facility] o Hunter, NP; Kumar, MD; Martin Rogers, PA o [address], [place], [place] [address] o [phone] o Mon-Fri 8:00-5:00 o Babies seen by providers at [facility] o Accepting Medicaid . [facility] ([facility] o Norfolk, MD; Kristi Fleenor, NP; West, MD o [address], [place], [place] [address] o [phone] o Mon-Fri 8:00-5:30, Sat&Sun by appointment (phones open at 8:30) o Babies seen by [facility] providers o Accepting Medicaid o Must be a first-time baby or sibling of current patient . [facility] [place]  o [address], [place], [place]  [address] o [phone]   Fax - [phone]  [place] ([address]) . [facility] o Brown, PA; Cowen, PA; Rice, MD; Helton, PA; Spry, MD o [address] [place], [place] [address] o [phone] o Mon-Thur 8:00-7:00, Fri 8:00-5:00, Sat 8:00-12:00, Sun 9:00-12:00 o Babies seen by [facility] providers o Accepting Medicaid . [facility] o Coe-Goins, MD; Marshall, MD; Pierre-Louis, MD o [address] [place] [address], [place],  [address] o [phone] o Mon-Thur 8:00-5:00 o Babies seen by providers at [facility] o Accepting Medicaid . [facility] o Bratton, MD; Coe-Goins, MD; Hayes, MD; Lewis, MD; List, MD; Lott, MD; Marshall, MD; Moran, MD; O'Neal, MD; Pierre-Louis, MD; Pitonzo, MD; Scholer, MD; Spangle, MD o [address] [place],    [address] o [phone] o Mon-Fri 8:00-5:30, Sat (Oct.-Mar.) 9:00-1:00 o Babies seen by providers at [facility] o Accepting Medicaid o Must fill out new patient packet, available online at [email or web address] . [facility] ([facility]) o Friddle, NP; Harris, NP; Kelly, NP; Logan, MD;  Melvin, PA; Poth, MD; Ramadoss, MD; Stanton, NP o [address], [place], [place] [address] o [phone] o Mon-Thur 8:00-5:30, Fri 8:00-5:00 o Babies seen by providers at [facility] o Accepting Medicaid  [place] ([address]) . [facility] o Dixon, PA; Spiceland, MD; Pickard, MD; Tapia, PA o [address], [place], [place] [address] o [phone] o Mon-Fri 8:00-5:00 o Babies seen by providers at [facility] o Accepting Medicaid   [place] ([address]) . [facility] [place] o Masneri, DO; Meyers, MD; Nelson, PA o [address], [place], [place] [address] o [phone] o Mon-Fri 8:00-5:00 o Babies seen by providers at [facility] o Does NOT accept Medicaid o Limited appointment availability, please call early in hospitalization  . [facility] [place] o Kunedd, DO; McGowen, MD o [address], [place], [place] [address] o [phone] o Mon-Fri 8:00-5:00 o Babies seen by [facility] providers o Does NOT accept Medicaid . [facility] [place] o Cameron, MD; MacDonald, MD; Michaels, PA; Nayak, MD o [address] [place] [address], [place], [place] [address] o [phone] o Mon-Fri 8:00-5:00 o After hours clinic ([address] [place], [place] [address]) [phone] Mon-Fri 5:00-8:00, Sat 12:00-6:00, Sun 10:00-4:00 o Babies seen by [facility] providers o Accepting Medicaid . [facility] [place] o [address], [place], [place]  [address] o [phone]   Fax - [phone]  [place] ([address]) . [facility] [place] o Andy, MD o [address], [place], [place] [address] o [phone] o Mon-Fri 8:00-5:00 o Babies seen by [facility] providers o Does NOT accept Medicaid . [facility] [place] ([facility] [place]) o Eksir, MD o [address], [place],   [address] o [phone] o Mon-Thur 8:00-7:00, Fri 8:00-5:00, Sat 8:00-12:00 o Babies seen by providers at [facility] o Accepting Medicaid - but does not have vaccinations in office (must be received elsewhere) o Limited availability, please call early in hospitalization  [place] ([address]) . [facility]  o Charlene Flemming, MD o [address], [place]  [address] o [phone]  Fax [phone]   

## 2020-01-18 NOTE — Progress Notes (Signed)
Pt. Presents for ROB and states vaginal itching has increased on the last week.

## 2020-01-18 NOTE — Progress Notes (Signed)
LOW-RISK PREGNANCY OFFICE VISIT  Patient name: Michele Carney MRN [id number]  Date of birth: Aug 19, 2000 Chief Complaint:   Routine Prenatal Visit  Subjective:   Michele Carney is a 19 y.o. G1P0 female at [redacted]w[redacted]d with an Estimated Date of Delivery: 02/13/20 being seen today for ongoing management of a low-risk pregnancy aeb has Supervision of normal first pregnancy, antepartum; GBS (group B streptococcus) UTI complicating pregnancy; Yeast vaginitis; and Significant discrepancy between uterine size and clinical dates, antepartum, second trimester on their problem list.  Patient presents today with complaint of vaginal itching. She states the itching started Friday after a long day.  She states the itching is located in the clitoral and labial area.  She states the itching continues despite an apple cider vinegar bath. Patient endorses fetal movement and occasional contractions.  She reports that cramping occurs in her lower and upper abdomen and lasts only about one minute.  Patient denies other vaginal concerns including abnormal discharge, leaking of fluid, and bleeding. Patient also reports bouts of dizziness and SOB.  Patient states the symptoms occur randomly, but most notably after long days.    Contractions: Not present. Vag. Bleeding: None.  Movement: Present.  Reviewed past medical,surgical, social, obstetrical and family history as well as problem list, medications and allergies.  Objective   Vitals:   01/18/20 0931  BP: 120/77  Pulse: (!) 117  Weight: 218 lb 9.6 oz (99.2 kg)  Body mass index is 41.3 kg/m.  Total Weight Gain:32 lb 9.6 oz (14.8 kg)         Physical Examination:   General appearance: Well appearing, and in no distress  Mental status: Alert, oriented to person, place, and time  Skin: Warm & dry  Cardiovascular: Normal heart rate noted  Respiratory: Normal respiratory effort, no distress  Abdomen: Soft, gravid, nontender, AGA with Fundal height  of Fundal Height: 37 cm  Pelvic: Cervical exam deferred           Extremities:    Fetal Status: Fetal Heart Rate (bpm): 151  Movement: Present   No results found for this or any previous visit (from the past 24 hour(s)).  Assessment & Plan:  Low-risk pregnancy of a 19 y.o., G1P0 at [redacted]w[redacted]d with an Estimated Date of Delivery: 02/13/20   1. Supervision of normal first pregnancy, antepartum -Discussed and reviewed postpartum planning including contraception, pediatricians, and infant feedings. *Patient desires to breastfeed and is considering patch for contraception. -Discussed usage of patch with breastfeeding.  Informed that would be started at >/=6 weeks dependent on milk production.   2. Vaginal itching -CV collected. -Patient informed that exam suspicious for yeast. -Rx for Terazol 7 sent to pharmacy on file.   3. GBS bacteriuria -GBS culture deferred.  4. [redacted] weeks gestation of pregnancy -RTO in one week.   5. Iron deficiency anemia during pregnancy -Discussed s/s of symptomatic anemia including dizziness, SOB, and tachycardia. -HR today 117. -Last HgB at 9.5 which was decreased from 10.7 -Discussed iron infusion in outpatient setting to assist in increasing HgB and resolving symptoms. -Patient without questions or concerns. -Orders placed for infusion. -Office staff notified for scheduling accordingly.     Meds:  Meds ordered this encounter  Medications  . terconazole (TERAZOL 7) 0.4 % vaginal cream    Sig: Place 1 applicator vaginally at bedtime.    Dispense:  45 g    Refill:  0    Order Specific Question:   Supervising Provider  Answer:   Reva Bores [[id number]]   Labs/procedures today:  Lab Orders  No laboratory test(s) ordered today     Reviewed: Preterm labor symptoms and general obstetric precautions including but not limited to vaginal bleeding, contractions, leaking of fluid and fetal movement were reviewed in detail with the patient.  All questions were  answered.  Follow-up: Return in about 1 week (around 01/25/2020) for LROB.  No orders of the defined types were placed in this encounter.  Cherre Robins MSN, CNM 01/18/2020

## 2020-01-19 LAB — CERVICOVAGINAL ANCILLARY ONLY
Bacterial Vaginitis (gardnerella): NEGATIVE
Candida Glabrata: NEGATIVE
Candida Vaginitis: POSITIVE — AB
Chlamydia: NEGATIVE
Comment: NEGATIVE
Comment: NEGATIVE
Comment: NEGATIVE
Comment: NEGATIVE
Comment: NEGATIVE
Comment: NORMAL
Neisseria Gonorrhea: NEGATIVE
Trichomonas: NEGATIVE

## 2020-01-26 ENCOUNTER — Other Ambulatory Visit: Payer: Self-pay

## 2020-01-26 ENCOUNTER — Ambulatory Visit (HOSPITAL_COMMUNITY)
Admission: RE | Admit: 2020-01-26 | Discharge: 2020-01-26 | Disposition: A | Discharge status: Home / Self Care | Payer: Medicaid Other | Source: Ambulatory Visit | Attending: Obstetrics and Gynecology | Admitting: Obstetrics and Gynecology

## 2020-01-26 DIAGNOSIS — O99019 Anemia complicating pregnancy, unspecified trimester: Secondary | ICD-10-CM | POA: Insufficient documentation

## 2020-01-26 MED ORDER — SODIUM CHLORIDE 0.9 % IV SOLN
510.0000 mg | Freq: Once | INTRAVENOUS | Status: AC
  Administered 2020-01-26: 510 mg via INTRAVENOUS
  Filled 2020-01-26: qty 17

## 2020-01-26 NOTE — Discharge Instructions (Signed)

## 2020-01-28 ENCOUNTER — Telehealth (INDEPENDENT_AMBULATORY_CARE_PROVIDER_SITE_OTHER): Payer: Medicaid Other | Admitting: Advanced Practice Midwife

## 2020-01-28 DIAGNOSIS — N898 Other specified noninflammatory disorders of vagina: Secondary | ICD-10-CM

## 2020-01-28 DIAGNOSIS — Z3A37 37 weeks gestation of pregnancy: Secondary | ICD-10-CM

## 2020-01-28 DIAGNOSIS — O26893 Other specified pregnancy related conditions, third trimester: Secondary | ICD-10-CM

## 2020-01-28 DIAGNOSIS — D509 Iron deficiency anemia, unspecified: Secondary | ICD-10-CM

## 2020-01-28 DIAGNOSIS — Z3400 Encounter for supervision of normal first pregnancy, unspecified trimester: Secondary | ICD-10-CM

## 2020-01-28 DIAGNOSIS — O99019 Anemia complicating pregnancy, unspecified trimester: Secondary | ICD-10-CM

## 2020-01-28 DIAGNOSIS — N949 Unspecified condition associated with female genital organs and menstrual cycle: Secondary | ICD-10-CM

## 2020-01-28 MED ORDER — FLUCONAZOLE 150 MG PO TABS: ORAL_TABLET | ORAL | 0 refills | Status: DC

## 2020-01-28 NOTE — Progress Notes (Signed)
   OBSTETRICS PRENATAL VIRTUAL VISIT ENCOUNTER NOTE  Provider location: [facility]   I connected with Michele Carney on 01/28/20 at  8:10 AM EDT by MyChart Video Encounter at home and verified that I am speaking with the correct person using two identifiers.   I discussed the limitations, risks, security and privacy concerns of performing an evaluation and management service virtually and the availability of in person appointments. I also discussed with the patient that there may be a patient responsible charge related to this service. The patient expressed understanding and agreed to proceed. Subjective:  Michele Carney is a 19 y.o. G1P0 at [redacted]w[redacted]d being seen today for ongoing prenatal care.  She is currently monitored for the following issues for this low-risk pregnancy and has Supervision of normal first pregnancy, antepartum; GBS (group B streptococcus) UTI complicating pregnancy; Yeast vaginitis; and Significant discrepancy between uterine size and clinical dates, antepartum, second trimester on their problem list.  Patient reports occasional contractions.  Contractions: Not present. Vag. Bleeding: None.  Movement: Present. Denies any leaking of fluid.   The following portions of the patient's history were reviewed and updated as appropriate: allergies, current medications, past family history, past medical history, past social history, past surgical history and problem list.   Objective:  There were no vitals filed for this visit.  Fetal Status:     Movement: Present     General:  Alert, oriented and cooperative. Patient is in no acute distress.  Respiratory: Normal respiratory effort, no problems with respiration noted  Mental Status: Normal mood and affect. Normal behavior. Normal judgment and thought content.  Rest of physical exam deferred due to type of encounter  Imaging: No results found.  Assessment and Plan:  Pregnancy: G1P0  at [redacted]w[redacted]d 1. Vaginal itching --Itching similar to previous yeast infections, will treat with Diflucan now at term. - fluconazole (DIFLUCAN) 150 MG tablet; Take one tablet now and 1 tablet in 3 days if symptoms continue.  Dispense: 2 tablet; Refill: 0  2. Supervision of normal first pregnancy, antepartum --Anticipatory guidance about next visits/weeks of pregnancy given. --Labor readiness reviewed, including EPO, raspberry leaf tea, and the [employer]. --Next visit in 1 week in office  3. Iron deficiency anemia during pregnancy --Continue PNV and iron every other day, no s/sx of anemia  4. Pelvic pressure in pregnancy, antepartum, third trimester --When standing, walking, no regular contractions  5. [redacted] weeks gestation of pregnancy   Term labor symptoms and general obstetric precautions including but not limited to vaginal bleeding, contractions, leaking of fluid and fetal movement were reviewed in detail with the patient. I discussed the assessment and treatment plan with the patient. The patient was provided an opportunity to ask questions and all were answered. The patient agreed with the plan and demonstrated an understanding of the instructions. The patient was advised to call back or seek an in-person office evaluation/go to MAU at [facility] for any urgent or concerning symptoms. Please refer to After Visit Summary for other counseling recommendations.   I provided 10 minutes of face-to-face time during this encounter.  Return in about 1 week (around 02/04/2020).  No future appointments.  Sharen Counter, CNM [facility], [facility]

## 2020-02-01 ENCOUNTER — Telehealth: Payer: Self-pay

## 2020-02-01 NOTE — Telephone Encounter (Signed)
Spoke to patient about her pain and advised her she can take tyenlol. I advised her if her contraction start to get closer 5-10 minutes apart she should follow up with the [facility] and children unit to be check out. Patient has appt on 02/04/2020.

## 2020-02-02 ENCOUNTER — Other Ambulatory Visit: Payer: Self-pay

## 2020-02-02 ENCOUNTER — Ambulatory Visit: Payer: Medicaid Other

## 2020-02-02 ENCOUNTER — Telehealth: Payer: Self-pay

## 2020-02-02 DIAGNOSIS — Z3400 Encounter for supervision of normal first pregnancy, unspecified trimester: Secondary | ICD-10-CM

## 2020-02-02 DIAGNOSIS — O2343 Unspecified infection of urinary tract in pregnancy, third trimester: Secondary | ICD-10-CM

## 2020-02-02 NOTE — Progress Notes (Signed)
ROB 38wk3d presents for B/P Check  Pt worked in today with  elevated B/P reading at home , complaints of HA's last 2-3 wks  with no relief with Tylenol. Helps to lay down. Decreased FM for a few weeks now  Pt states + movement after meal today At least more than 10 fetal kick counts  B/P on pt cuff from home done in office to compare to office cuff was 132/68 P :120  Consulted with provider in the office regarding pt B/P 112/72 P 120 with office cuff  pt offered Rx management for HA's and advised to try caffeine at home if no relief to report to MAU  Pt declined Rx and will try caffeine discussed PIH precaution and labor precautions pt  to keep upcoming appt  Patient agreeable and voiced understanding.

## 2020-02-02 NOTE — Telephone Encounter (Signed)
S/w pt and she stated that she has had some elevated BPs at home  155/78, and then, 147/84 today. Pt is unsure if cuff is working correctly, pt she also reports a headache unrelieved by Tylenol. Pt will be scheduled for BP check.

## 2020-02-03 NOTE — Progress Notes (Signed)
I have reviewed this chart and agree with the RN/CMA assessment and management.    K. Meryl Ardit Danh, M.D. Attending [facility])   

## 2020-02-04 ENCOUNTER — Ambulatory Visit (INDEPENDENT_AMBULATORY_CARE_PROVIDER_SITE_OTHER): Payer: Medicaid Other | Admitting: Obstetrics

## 2020-02-04 ENCOUNTER — Other Ambulatory Visit: Payer: Self-pay

## 2020-02-04 ENCOUNTER — Inpatient Hospital Stay (HOSPITAL_BASED_OUTPATIENT_CLINIC_OR_DEPARTMENT_OTHER): Payer: Medicaid Other

## 2020-02-04 ENCOUNTER — Inpatient Hospital Stay (HOSPITAL_COMMUNITY)
Admission: AD | Admit: 2020-02-04 | Discharge: 2020-02-04 | Disposition: A | Discharge status: Home / Self Care | Payer: Medicaid Other | Attending: Obstetrics & Gynecology | Admitting: Obstetrics & Gynecology

## 2020-02-04 ENCOUNTER — Encounter: Payer: Self-pay | Admitting: Obstetrics

## 2020-02-04 VITALS — BP 125/73 | Wt 224.5 lb

## 2020-02-04 DIAGNOSIS — O368130 Decreased fetal movements, third trimester, not applicable or unspecified: Secondary | ICD-10-CM | POA: Diagnosis not present

## 2020-02-04 DIAGNOSIS — Z3A38 38 weeks gestation of pregnancy: Secondary | ICD-10-CM | POA: Insufficient documentation

## 2020-02-04 DIAGNOSIS — O99213 Obesity complicating pregnancy, third trimester: Secondary | ICD-10-CM | POA: Insufficient documentation

## 2020-02-04 DIAGNOSIS — E669 Obesity, unspecified: Secondary | ICD-10-CM | POA: Insufficient documentation

## 2020-02-04 DIAGNOSIS — O26893 Other specified pregnancy related conditions, third trimester: Secondary | ICD-10-CM | POA: Insufficient documentation

## 2020-02-04 DIAGNOSIS — O471 False labor at or after 37 completed weeks of gestation: Secondary | ICD-10-CM | POA: Diagnosis not present

## 2020-02-04 DIAGNOSIS — Z3400 Encounter for supervision of normal first pregnancy, unspecified trimester: Secondary | ICD-10-CM | POA: Diagnosis not present

## 2020-02-04 DIAGNOSIS — Z3689 Encounter for other specified antenatal screening: Secondary | ICD-10-CM | POA: Insufficient documentation

## 2020-02-04 DIAGNOSIS — M545 Low back pain: Secondary | ICD-10-CM | POA: Insufficient documentation

## 2020-02-04 DIAGNOSIS — R102 Pelvic and perineal pain: Secondary | ICD-10-CM | POA: Insufficient documentation

## 2020-02-04 DIAGNOSIS — B951 Streptococcus, group B, as the cause of diseases classified elsewhere: Secondary | ICD-10-CM

## 2020-02-04 DIAGNOSIS — O368190 Decreased fetal movements, unspecified trimester, not applicable or unspecified: Secondary | ICD-10-CM

## 2020-02-04 DIAGNOSIS — O368131 Decreased fetal movements, third trimester, fetus 1: Secondary | ICD-10-CM

## 2020-02-04 DIAGNOSIS — O2343 Unspecified infection of urinary tract in pregnancy, third trimester: Secondary | ICD-10-CM

## 2020-02-04 NOTE — MAU Note (Signed)
Sent from office for non reactive FHR, decrease FM since yesterday. Denies bleeding or decrease of fluid.

## 2020-02-04 NOTE — MAU Provider Note (Signed)
Chief Complaint:  Decreased Fetal Movement   First Provider Initiated Contact with Patient 02/04/20 1215     HPI: Michele Carney is a 19 y.o. G1P0 at [redacted]w[redacted]d who was sent to MAU by [facility] for a non-reactive NST and decreased fetal movement. She has also been having contractions for the past 2-3 days that she rates as moderate, non-progressive, and mostly in her lower back/lower abd. Denies vaginal bleeding, leaking of fluid, fever, falls, or recent illness.   Pregnancy Course: Uncomplicated except for repeated vaginal yeast infections  Past Medical History:  Diagnosis Date  . Medical history non-contributory    OB History  Gravida Para Term Preterm AB Living  1         0  SAB TAB Ectopic Multiple Live Births               # Outcome Date GA Lbr Len/2nd Weight Sex Delivery Anes PTL Lv  1 Current            Past Surgical History:  Procedure Laterality Date  . NO PAST SURGERIES     Family History  Problem Relation Age of Onset  . Diabetes Maternal Grandmother   . Cancer Maternal Grandfather        colon   Social History   Tobacco Use  . Smoking status: Never Smoker  . Smokeless tobacco: Never Used  Vaping Use  . Vaping Use: Never used  Substance Use Topics  . Alcohol use: Not Currently  . Drug use: Not Currently    Types: Marijuana   No Known Allergies No medications prior to admission.    I have reviewed patient's Past Medical Hx, Surgical Hx, Family Hx, Social Hx, medications and allergies.   ROS:  Review of Systems  Constitutional: Positive for fatigue (tired from 2-3 days of contractions).  Eyes: Negative for photophobia and visual disturbance.  Respiratory: Negative for cough and shortness of breath.   Gastrointestinal: Negative for nausea and vomiting.  Genitourinary: Positive for pelvic pain (contractions q5-54min).  Musculoskeletal: Positive for back pain (lower back).  Neurological: Negative for dizziness and headaches.  All other systems  reviewed and are negative.   Physical Exam   Patient Vitals for the past 24 hrs:  BP Temp Temp src Pulse Resp SpO2 Weight  02/04/20 1451 (!) 127/59 -- -- (!) 107 -- -- --  02/04/20 1132 125/70 99.1 F (37.3 C) Oral (!) 115 17 99 % 265 lb (120.2 kg)    Constitutional: Well-developed, well-nourished female in no acute distress.  Cardiovascular: normal rate & rhythm, no murmur Respiratory: normal effort, lung sounds clear throughout GI: Abd soft, non-tender, gravid appropriate for gestational age. Pos BS x 4 MS: Extremities nontender, no edema, normal ROM Neurologic: Alert and oriented x 4.  Pelvic: NEFG, physiologic discharge, no blood, cervix clean.   Dilation: Closed Effacement (%): 50 Cervical Position: Posterior Exam by:: Vora Clover cnm  Fetal Tracing: reassuring Baseline: 150 Variability: minimal Accelerations: present Decelerations: none Toco: q5-73min   Imaging:     MAU Course & MDM: Orders Placed This Encounter  Procedures  . [place] MFM FETAL BPP WO NON STRESS  . Discharge patient   - Encouraged fluids and a position change, ordered BPP - NST remained reassuring but with minimal variability, BPP= 8/8 - Consulted with Dr. Debroah Loop who agreed pt could discharge  - Gave instructions on [employer] to stop prodromal contractions  Assessment: 1. False labor after 37 completed weeks of gestation   2. Decreased  fetal movement   3. NST (non-stress test) reactive    Plan: Discharge home in stable condition with term labor precautions   Follow-up Information     [facility]. Go to.   Specialty: Obstetrics and Gynecology Why: as scheduled for ongoing prenatal care  Contact information: [address], [address] [place] [address] [phone]                Allergies as of 02/04/2020   No Known Allergies      Medication List     TAKE these medications    acetaminophen 325 MG tablet Commonly known as:  TYLENOL Take 650 mg by mouth every 6 (six) hours as needed.   Blood Pressure Monitor Automat Devi 1 Device by Does not apply route daily. Automatic blood pressure cuff regular size. To monitor blood pressure regularly at home. ICD-10 code: O35.90   Comfort Fit Maternity Supp Med Misc 1 Device by Does not apply route daily.   cyclobenzaprine 10 MG tablet Commonly known as: FLEXERIL Take 1 tablet (10 mg total) by mouth every 8 (eight) hours as needed for muscle spasms.   ferrous sulfate 325 (65 FE) MG tablet Take 1 tablet (325 mg total) by mouth every other day.   fluconazole 150 MG tablet Commonly known as: DIFLUCAN Take one tablet now and 1 tablet in 3 days if symptoms continue.   fluticasone 50 MCG/ACT nasal spray Commonly known as: FLONASE Place 2 sprays into both nostrils daily.   Gojji Weight Scale Misc 1 Device by Does not apply route daily as needed. To weight self daily as needed at home. ICD-10 code: O09.90   ipratropium 0.06 % nasal spray Commonly known as: ATROVENT Place 2 sprays into both nostrils 4 (four) times daily.   IRON PO Take by mouth.   pantoprazole 40 MG tablet Commonly known as: Protonix Take 1 tablet (40 mg total) by mouth daily.   Prenatal Adult Gummy/DHA/FA 0.4-25 MG Chew Chew 1 tablet by mouth daily.   prenatal multivitamin Tabs tablet Take 1 tablet by mouth daily at 12 noon.   terconazole 0.4 % vaginal cream Commonly known as: Terazol 7 Place 1 applicator vaginally at bedtime.       Edd Arbour, CNM, MSN, [facility] 02/04/20 4:09 PM

## 2020-02-04 NOTE — Progress Notes (Signed)
Subjective:  Michele Carney is a 19 y.o. G1P0 at [redacted]w[redacted]d being seen today for ongoing prenatal care.  She is currently monitored for the following issues for this low-risk pregnancy and has Supervision of normal first pregnancy, antepartum; GBS (group B streptococcus) UTI complicating pregnancy; Yeast vaginitis; and Significant discrepancy between uterine size and clinical dates, antepartum, second trimester on their problem list.  Patient reports occasional contractions.  Contractions: Irritability. Vag. Bleeding: None.  Movement: (!) Decreased. Denies leaking of fluid.   The following portions of the patient's history were reviewed and updated as appropriate: allergies, current medications, past family history, past medical history, past social history, past surgical history and problem list. Problem list updated.  Objective:   Vitals:   02/04/20 0914  BP: 125/73  Weight: 224 lb 8 oz (101.8 kg)    Fetal Status:     Movement: (!) Decreased     General:  Alert, oriented and cooperative. Patient is in no acute distress.  Skin: Skin is warm and dry. No rash noted.   Cardiovascular: Normal heart rate noted  Respiratory: Normal respiratory effort, no problems with respiration noted  Abdomen: Soft, gravid, appropriate for gestational age. Pain/Pressure: Present     Pelvic:  Cervical exam deferred        Extremities: Normal range of motion.  Edema: None  Mental Status: Normal mood and affect. Normal behavior. Normal judgment and thought content.   Urinalysis:      Assessment and Plan:  Pregnancy: G1P0 at [redacted]w[redacted]d  1. Supervision of normal first pregnancy, antepartum  2. Group B Streptococcus urinary tract infection affecting pregnancy in third trimester - treat in labor  3. Decreased Fetal Movement -NST:  Non Reactive.  FHR 150 bpm.  Decreased variability.  accels , 5 bpm.  No decels.  Contractions q 3-4 minutes, moderate to palpation - patient sent to [facility] for further  evaluation   Term labor symptoms and general obstetric precautions including but not limited to vaginal bleeding, contractions, leaking of fluid and fetal movement were reviewed in detail with the patient. Please refer to After Visit Summary for other counseling recommendations.   Return in about 1 week (around 02/11/2020) for ROB.   Brock Bad, MD  02/04/20

## 2020-02-04 NOTE — Progress Notes (Signed)
Pt presents for ROB c/o DEC FM since last night. No response to sugary foods nor cold drinks per pt. Experienced technical difficulties attempting to connect to [facility]

## 2020-02-04 NOTE — Discharge Instructions (Signed)
The [employer]  This circuit takes at least 90 minutes to complete so clear your schedule and make mental preparations so you can relax in your environment. The second step requires a lot of pillows so gather them up before beginning Before starting, you should empty your bladder! Have a nice drink nearby, and make sure it has a straw! If you are having contractions, this circuit should be done through contractions, try not to change positions between steps Before you begin...  "I named this 'circuit' after my friend Deriyah Miles, who shared and discussed it with me when I was working with a client whose labor seemed to be stalled out and no longer progressing... This circuit is useful to help get the baby lined up, ideally, in the "Left Occiput Anterior" (LOA) Position, both before labor begins and when some corrections need to be done during labor. Prenatally, this position set can help to rotate a baby. As a natural method of induction, this can help get things going if baby just needed a gentle nudge of position to set things off. To the best of my knowledge, this group of positions will not "hurt" a baby that is already lined up correctly." - Sharon Muza   Step One: Open-knee Chest Stay in this position for 30 minutes, start in cat/cow, then drop your chest as low as you can to the bed or the floor and your bottom as high as you can. Knees should be fairly wide apart, and the angle between the torso/thighs should be wider than 90 degrees. Wiggle around, prop with lots of pillows and use this time to get totally relaxed. This position allows the baby to scoot out of the pelvis a bit and gives them room to rotate, shift their head position, etc. If the pregnant person finds it helpful, careful positioning with a rebozo under the belly, with gentle tension from a support person behind can help maintain this position for the full 30 minutes.  Step Two:Exaggerated Left Side  Lying Roll to your left side, bringing your top leg as high as possible and keeping your bottom leg straight. Roll forward as much as possible, again using a lot of pillows. Sink into the bed and relax some more. If you fall asleep, that's totally okay and you can stay there! If not, stay here for at least another half an hour. Try and get your top right leg up towards your head and get as rolled over onto your belly as much as possible. If you repeat the circuit during labor, try alternating left and right sides. We know the photo the left is actually right side... just flip the image in your head.  Step Three: Moving and Lunges Lunge, walk stairs facing sideways, 2 at a time, (have a spotter downstairs of you!), take a walk outside with one foot on the curb and the other on the street, sit on a birth ball and hula- anything that's upright and putting your pelvis in open, asymmetrical positions. Spend at least 30 minutes doing this one as well to give your baby a chance to move down. If you are lunging or stair or curb walking, you should lunge/walk/go up stairs in the direction that feels better to you. The key with the lunge is that the toes of the higher leg and mom's belly button should be at right angles. Do not lunge over your knee, that closes the pelvis.     Melita Hamilton Miles: Circuit Creator - [email or web address] Sharon   Muza, CD, BDT (DONA), LCCE, FACCE: Supporting Content - [email or web address] Rulon Eisenmenger: Photography - [email or web address] Luther Hearing CD/CDT [facility]): Print and Webmaster - [email or web address] [employer] [email or web address]    Braxton Hicks Contractions Contractions of the uterus can occur throughout pregnancy, but they are not always a sign that you are in labor. You may have practice contractions called Braxton Hicks contractions. These false labor contractions are sometimes confused  with true labor. What are Deberah Pelton contractions? Braxton Hicks contractions are tightening movements that occur in the muscles of the uterus before labor. Unlike true labor contractions, these contractions do not result in opening (dilation) and thinning of the cervix. Toward the end of pregnancy (32-34 weeks), Braxton Hicks contractions can happen more often and may become stronger. These contractions are sometimes difficult to tell apart from true labor because they can be very uncomfortable. You should not feel embarrassed if you go to the hospital with false labor. Sometimes, the only way to tell if you are in true labor is for your health care provider to look for changes in the cervix. The health care provider will do a physical exam and may monitor your contractions. If you are not in true labor, the exam should show that your cervix is not dilating and your water has not broken. If there are no other health problems associated with your pregnancy, it is completely safe for you to be sent home with false labor. You may continue to have Braxton Hicks contractions until you go into true labor. How to tell the difference between true labor and false labor True labor  Contractions last 30-70 seconds.  Contractions become very regular.  Discomfort is usually felt in the top of the uterus, and it spreads to the lower abdomen and low back.  Contractions do not go away with walking.  Contractions usually become more intense and increase in frequency.  The cervix dilates and gets thinner. False labor  Contractions are usually shorter and not as strong as true labor contractions.  Contractions are usually irregular.  Contractions are often felt in the front of the lower abdomen and in the groin.  Contractions may go away when you walk around or change positions while lying down.  Contractions get weaker and are shorter-lasting as time goes on.  The cervix usually does not dilate or  become thin. Follow these instructions at home:   Take over-the-counter and prescription medicines only as told by your health care provider.  Keep up with your usual exercises and follow other instructions from your health care provider.  Eat and drink lightly if you think you are going into labor.  If Braxton Hicks contractions are making you uncomfortable: ? Change your position from lying down or resting to walking, or change from walking to resting. ? Sit and rest in a tub of warm water. ? Drink enough fluid to keep your urine pale yellow. Dehydration may cause these contractions. ? Do slow and deep breathing several times an hour.  Keep all follow-up prenatal visits as told by your health care provider. This is important. Contact a health care provider if:  You have a fever.  You have continuous pain in your abdomen. Get help right away if:  Your contractions become stronger, more regular, and closer together.  You have fluid leaking or gushing from your vagina.  You pass blood-tinged mucus (bloody show).  You have bleeding from your vagina.  You have low back pain that you never had  before.  You feel your baby's head pushing down and causing pelvic pressure.  Your baby is not moving inside you as much as it used to. Summary  Contractions that occur before labor are called Braxton Hicks contractions, false labor, or practice contractions.  Braxton Hicks contractions are usually shorter, weaker, farther apart, and less regular than true labor contractions. True labor contractions usually become progressively stronger and regular, and they become more frequent.  Manage discomfort from [facility] contractions by changing position, resting in a warm bath, drinking plenty of water, or practicing deep breathing. This information is not intended to replace advice given to you by your health care provider. Make sure you discuss any questions you have with your health care  provider. Document Revised: 04/18/2017 Document Reviewed: 09/19/2016 Elsevier Patient Education  2020 [employer].

## 2020-02-06 ENCOUNTER — Other Ambulatory Visit: Payer: Self-pay

## 2020-02-06 ENCOUNTER — Inpatient Hospital Stay (HOSPITAL_COMMUNITY)
Admission: AD | Admit: 2020-02-06 | Discharge: 2020-02-10 | DRG: 787 | Disposition: A | Discharge status: Home / Self Care | Payer: Medicaid Other | Attending: Obstetrics & Gynecology | Admitting: Obstetrics & Gynecology

## 2020-02-06 ENCOUNTER — Encounter (HOSPITAL_COMMUNITY): Payer: Self-pay | Admitting: Obstetrics and Gynecology

## 2020-02-06 DIAGNOSIS — B951 Streptococcus, group B, as the cause of diseases classified elsewhere: Secondary | ICD-10-CM | POA: Diagnosis present

## 2020-02-06 DIAGNOSIS — Z20822 Contact with and (suspected) exposure to covid-19: Secondary | ICD-10-CM | POA: Diagnosis present

## 2020-02-06 DIAGNOSIS — O9081 Anemia of the puerperium: Secondary | ICD-10-CM | POA: Diagnosis not present

## 2020-02-06 DIAGNOSIS — O99214 Obesity complicating childbirth: Secondary | ICD-10-CM | POA: Diagnosis present

## 2020-02-06 DIAGNOSIS — O2340 Unspecified infection of urinary tract in pregnancy, unspecified trimester: Secondary | ICD-10-CM | POA: Diagnosis present

## 2020-02-06 DIAGNOSIS — Z3400 Encounter for supervision of normal first pregnancy, unspecified trimester: Secondary | ICD-10-CM

## 2020-02-06 DIAGNOSIS — O288 Other abnormal findings on antenatal screening of mother: Secondary | ICD-10-CM | POA: Diagnosis present

## 2020-02-06 DIAGNOSIS — D62 Acute posthemorrhagic anemia: Secondary | ICD-10-CM | POA: Diagnosis not present

## 2020-02-06 DIAGNOSIS — Z3A39 39 weeks gestation of pregnancy: Secondary | ICD-10-CM | POA: Diagnosis not present

## 2020-02-06 DIAGNOSIS — O368130 Decreased fetal movements, third trimester, not applicable or unspecified: Principal | ICD-10-CM | POA: Diagnosis present

## 2020-02-06 DIAGNOSIS — O99824 Streptococcus B carrier state complicating childbirth: Secondary | ICD-10-CM | POA: Diagnosis present

## 2020-02-06 DIAGNOSIS — D509 Iron deficiency anemia, unspecified: Secondary | ICD-10-CM

## 2020-02-06 DIAGNOSIS — O2343 Unspecified infection of urinary tract in pregnancy, third trimester: Secondary | ICD-10-CM

## 2020-02-06 LAB — CBC
HCT: 34 % — ABNORMAL LOW (ref 36.0–46.0)
Hemoglobin: 10.5 g/dL — ABNORMAL LOW (ref 12.0–15.0)
MCH: 24 pg — ABNORMAL LOW (ref 26.0–34.0)
MCHC: 30.9 g/dL (ref 30.0–36.0)
MCV: 77.8 fL — ABNORMAL LOW (ref 80.0–100.0)
Platelets: 201 10*3/uL (ref 150–400)
RBC: 4.37 MIL/uL (ref 3.87–5.11)
RDW: 18.6 % — ABNORMAL HIGH (ref 11.5–15.5)
WBC: 9.7 10*3/uL (ref 4.0–10.5)
nRBC: 0 % (ref 0.0–0.2)

## 2020-02-06 LAB — TYPE AND SCREEN
ABO/RH(D): A POS
Antibody Screen: NEGATIVE

## 2020-02-06 LAB — SARS CORONAVIRUS 2 BY RT PCR (HOSPITAL ORDER, PERFORMED IN ~~LOC~~ HOSPITAL LAB): SARS Coronavirus 2: NEGATIVE

## 2020-02-06 MED ORDER — LIDOCAINE HCL (PF) 1 % IJ SOLN: 30.0000 mL | INTRAMUSCULAR | Status: DC | PRN

## 2020-02-06 MED ORDER — FLEET ENEMA 7-19 GM/118ML RE ENEM: 1.0000 | ENEMA | RECTAL | Status: DC | PRN

## 2020-02-06 MED ORDER — EPHEDRINE 5 MG/ML INJ: 10.0000 mg | INTRAVENOUS | Status: DC | PRN

## 2020-02-06 MED ORDER — PENICILLIN G POT IN DEXTROSE 60000 UNIT/ML IV SOLN
3.0000 10*6.[IU] | INTRAVENOUS | Status: DC
  Administered 2020-02-06 – 2020-02-08 (×8): 3 10*6.[IU] via INTRAVENOUS
  Filled 2020-02-06 (×9): qty 50

## 2020-02-06 MED ORDER — TERBUTALINE SULFATE 1 MG/ML IJ SOLN
0.2500 mg | Freq: Once | INTRAMUSCULAR | Status: AC | PRN
  Administered 2020-02-08: 0.25 mg via SUBCUTANEOUS
  Filled 2020-02-06: qty 1

## 2020-02-06 MED ORDER — LACTATED RINGERS IV SOLN
500.0000 mL | INTRAVENOUS | Status: DC | PRN
  Administered 2020-02-06 – 2020-02-07 (×2): 500 mL via INTRAVENOUS

## 2020-02-06 MED ORDER — OXYTOCIN-SODIUM CHLORIDE 30-0.9 UT/500ML-% IV SOLN
2.5000 [IU]/h | INTRAVENOUS | Status: DC
  Filled 2020-02-06: qty 500

## 2020-02-06 MED ORDER — DIPHENHYDRAMINE HCL 50 MG/ML IJ SOLN
12.5000 mg | INTRAMUSCULAR | Status: DC | PRN
  Administered 2020-02-07: 12.5 mg via INTRAVENOUS
  Filled 2020-02-06: qty 1

## 2020-02-06 MED ORDER — FENTANYL CITRATE (PF) 100 MCG/2ML IJ SOLN
100.0000 ug | INTRAMUSCULAR | Status: DC | PRN
  Administered 2020-02-06: 100 ug via INTRAVENOUS
  Filled 2020-02-06: qty 2

## 2020-02-06 MED ORDER — FENTANYL CITRATE (PF) 100 MCG/2ML IJ SOLN
INTRAMUSCULAR | Status: AC
  Administered 2020-02-06: 100 ug
  Filled 2020-02-06: qty 2

## 2020-02-06 MED ORDER — OXYCODONE-ACETAMINOPHEN 5-325 MG PO TABS: 1.0000 | ORAL_TABLET | ORAL | Status: DC | PRN

## 2020-02-06 MED ORDER — ONDANSETRON HCL 4 MG/2ML IJ SOLN
4.0000 mg | Freq: Four times a day (QID) | INTRAMUSCULAR | Status: DC | PRN
  Administered 2020-02-07 (×2): 4 mg via INTRAVENOUS
  Filled 2020-02-06 (×2): qty 2

## 2020-02-06 MED ORDER — OXYTOCIN BOLUS FROM INFUSION: 333.0000 mL | Freq: Once | INTRAVENOUS | Status: DC

## 2020-02-06 MED ORDER — SODIUM CHLORIDE 0.9 % IV SOLN
5.0000 10*6.[IU] | Freq: Once | INTRAVENOUS | Status: AC
  Administered 2020-02-06: 5 10*6.[IU] via INTRAVENOUS
  Filled 2020-02-06: qty 5

## 2020-02-06 MED ORDER — OXYCODONE-ACETAMINOPHEN 5-325 MG PO TABS: 2.0000 | ORAL_TABLET | ORAL | Status: DC | PRN

## 2020-02-06 MED ORDER — PHENYLEPHRINE 40 MCG/ML (10ML) SYRINGE FOR IV PUSH (FOR BLOOD PRESSURE SUPPORT)
80.0000 ug | PREFILLED_SYRINGE | INTRAVENOUS | Status: DC | PRN
  Administered 2020-02-07 (×2): 80 ug via INTRAVENOUS

## 2020-02-06 MED ORDER — SOD CITRATE-CITRIC ACID 500-334 MG/5ML PO SOLN
30.0000 mL | ORAL | Status: DC | PRN
  Administered 2020-02-08: 30 mL via ORAL
  Filled 2020-02-06: qty 15

## 2020-02-06 MED ORDER — OXYTOCIN-SODIUM CHLORIDE 30-0.9 UT/500ML-% IV SOLN
1.0000 m[IU]/min | INTRAVENOUS | Status: DC
  Administered 2020-02-06 – 2020-02-07 (×3): 2 m[IU]/min via INTRAVENOUS

## 2020-02-06 MED ORDER — ACETAMINOPHEN 325 MG PO TABS
650.0000 mg | ORAL_TABLET | ORAL | Status: DC | PRN
  Administered 2020-02-07 – 2020-02-08 (×3): 650 mg via ORAL
  Filled 2020-02-06 (×3): qty 2

## 2020-02-06 MED ORDER — FENTANYL-BUPIVACAINE-NACL 0.5-0.125-0.9 MG/250ML-% EP SOLN
12.0000 mL/h | EPIDURAL | Status: DC | PRN
  Administered 2020-02-07: 12 mL/h via EPIDURAL
  Filled 2020-02-06 (×2): qty 250

## 2020-02-06 MED ORDER — LACTATED RINGERS IV SOLN
500.0000 mL | Freq: Once | INTRAVENOUS | Status: AC
  Administered 2020-02-07: 500 mL via INTRAVENOUS

## 2020-02-06 MED ORDER — LACTATED RINGERS IV SOLN: INTRAVENOUS | Status: DC

## 2020-02-06 MED ORDER — PHENYLEPHRINE 40 MCG/ML (10ML) SYRINGE FOR IV PUSH (FOR BLOOD PRESSURE SUPPORT)
80.0000 ug | PREFILLED_SYRINGE | INTRAVENOUS | Status: DC | PRN
  Filled 2020-02-06: qty 10

## 2020-02-06 NOTE — MAU Provider Note (Signed)
S: [name] is a 19 y.o. G1P0 at [redacted]w[redacted]d  who presents to MAU today complaining contractions q 5 minutes since 0800. She denies vaginal bleeding. She denies LOF. She reports normal fetal movement.    O: BP (!) 135/58 (BP Location: Right Arm)   Pulse (!) 113   Temp 98.3 F (36.8 C) (Oral)   Resp 18   LMP 04/23/2019   SpO2 98%  GENERAL: Well-developed, well-nourished female in no acute distress.  HEAD: Normocephalic, atraumatic.  CHEST: Normal effort of breathing, regular heart rate ABDOMEN: Soft, nontender, gravid  Cervical exam:  Dilation: Fingertip Effacement (%): 50 Exam by:: R. Fang RN   Fetal Monitoring: Baseline: 150 Variability: moderate Accelerations: 10x10 Decelerations: none Contractions: none   A: SIUP at [redacted]w[redacted]d  Non reactive NST at term  P: Admit to labor and delivery Report called to labor team  Rolm Bookbinder, CNM 02/06/2020 3:26 PM

## 2020-02-06 NOTE — MAU Note (Signed)
Michele Carney is a 19 y.o. at [redacted]w[redacted]d here in MAU reporting: contractions since 0600, states they are close. Maximum of 15 minutes apart. Also has a headache and in the lobby noticed some visual changes. Has seen a little bit of spotting. No LOF. +FM  Onset of complaint: 0600  Pain score: contractions 8/10, headache 5/10  Vitals:   02/06/20 1414  BP: 126/69  Pulse: (!) 111  Resp: 18  Temp: 98.3 F (36.8 C)  SpO2: 99%     FHT: +FM  Lab orders placed from triage: none

## 2020-02-06 NOTE — Progress Notes (Signed)
Labor Progress Note Michele Carney is a 19 y.o. G1P0 at [redacted]w[redacted]d presented for IOL-NRNST and DFM.  S: Doing well without complaints.  O:  BP (!) 111/94   Pulse (!) 118   Temp 98.1 F (36.7 C) (Oral)   Resp 18   Ht 5\' 1"  (1.549 m)   Wt 102.9 kg   LMP 04/23/2019   SpO2 98%   BMI 42.85 kg/m  EFM: baseline 145bpm/mod variability/+ accels/no decels Toco: q1-6 minutes  CVE: Dilation: 5 Effacement (%): 50 Cervical Position: Posterior Station: -2 Presentation: Vertex Exam by:: [email or web address] RN    A&P: 19 y.o. G1P0 [redacted]w[redacted]d presented for IOL-NRNST and DFM. #Labor: S/p FB, out around 2045. Cervical exam as noted above per PRN. Will initiate pitocin at this time and continue augmentation as appropriate. #Pain: PRN #FWB: cat 1 #GBS positive, PCN, adequate ppx  2046, MD 9:04 PM

## 2020-02-06 NOTE — H&P (Addendum)
OBSTETRIC ADMISSION HISTORY AND PHYSICAL  Michele Carney is a 19 y.o. female G1P0 with IUP at [redacted]w[redacted]d by 5 week U/S presenting for IOL due to decreased fetal movement and initial non-reactive NST in MAU today.  She reports +FMs, No LOF, no blurry vision, peripheral edema, and RUQ pain. She has had a headache intermittent during pregnancy and had one this morning, mild compared to usual. She plans on breast and formula feeding. She request nexplanon and patch for birth control. She [facility] care [facility]     Dating: By 5 week U/S --->  Estimated Date of Delivery: 02/13/20  Sono:    @[redacted]w[redacted]d , CWD, normal anatomy, cephalic presentation, 2277g, 06-25-1976 EFW  BPP 8/8 on 9/17.    Prenatal History/Complications:  --GBS bacteruria  --Obesity (current 42.5), approximate 50 lb weight gain during pregnancy  --Anemia of pregnancy (hgb today 10.5)   Past Medical History: Past Medical History:  Diagnosis Date   Medical history non-contributory     Past Surgical History: Past Surgical History:  Procedure Laterality Date   NO PAST SURGERIES      Obstetrical History: OB History     Gravida  1   Para      Term      Preterm      AB      Living  0      SAB      TAB      Ectopic      Multiple      Live Births              Social History Social History   Socioeconomic History   Marital status: Single    Spouse name: Not on file   Number of children: Not on file   Years of education: Not on file   Highest education level: High school graduate  Occupational History   Not on file  Tobacco Use   Smoking status: Never Smoker   Smokeless tobacco: Never Used  Vaping Use   Vaping Use: Never used  Substance and Sexual Activity   Alcohol use: Not Currently   Drug use: Not Currently    Types: Marijuana   Sexual activity: Yes    Comment: on the patch prior  Other Topics Concern   Not on file  Social History Narrative   Not on file   Social  Determinants of Health   Financial Resource Strain: Low Risk    Difficulty of Paying Living Expenses: Not hard at all  Food Insecurity: No Food Insecurity   Worried About 10/17 in the Last Year: Never true   Ran Out of Food in the Last Year: Never true  Transportation Needs: No Transportation Needs   Lack of Transportation (Medical): No   Lack of Transportation (Non-Medical): No  Physical Activity:    Days of Exercise per Week: Not on file   Minutes of Exercise per Session: Not on file  Stress: No Stress Concern Present   Feeling of Stress : Not at all  Social Connections:    Frequency of Communication with Friends and Family: Not on file   Frequency of Social Gatherings with Friends and Family: Not on file   Attends Religious Services: Not on file   Active Member of Clubs or Organizations: Not on file   Attends Programme researcher, broadcasting/film/video Meetings: Not on file   Marital Status: Not on file    Family History: Family History  Problem Relation  Age of Onset   Diabetes Maternal Grandmother    Cancer Maternal Grandfather        colon    Allergies: No Known Allergies  Medications Prior to Admission  Medication Sig Dispense Refill Last Dose   acetaminophen (TYLENOL) 325 MG tablet Take 650 mg by mouth every 6 (six) hours as needed.   02/05/2020 at Unknown time   Prenatal MV & Min w/FA-DHA (PRENATAL ADULT GUMMY/DHA/FA) 0.4-25 MG CHEW Chew 1 tablet by mouth daily. 30 tablet 2 02/05/2020 at Unknown time   Blood Pressure Monitoring (BLOOD PRESSURE MONITOR AUTOMAT) DEVI 1 Device by Does not apply route daily. Automatic blood pressure cuff regular size. To monitor blood pressure regularly at home. ICD-10 code: O09.90 1 each 0    cyclobenzaprine (FLEXERIL) 10 MG tablet Take 1 tablet (10 mg total) by mouth every 8 (eight) hours as needed for muscle spasms. (Patient not taking: Reported on 12/14/2019) 30 tablet 1    Elastic Bandages & Supports (COMFORT FIT MATERNITY SUPP MED) MISC 1  Device by Does not apply route daily. (Patient not taking: Reported on 01/28/2020) 1 each 0    Ferrous Sulfate (IRON PO) Take by mouth. (Patient not taking: Reported on 01/28/2020)      ferrous sulfate 325 (65 FE) MG tablet Take 1 tablet (325 mg total) by mouth every other day. (Patient not taking: Reported on 01/28/2020) 30 tablet 2    fluconazole (DIFLUCAN) 150 MG tablet Take one tablet now and 1 tablet in 3 days if symptoms continue. (Patient not taking: Reported on 02/04/2020) 2 tablet 0    fluticasone (FLONASE) 50 MCG/ACT nasal spray Place 2 sprays into both nostrils daily. (Patient not taking: Reported on 12/21/2019) 1 g 0    ipratropium (ATROVENT) 0.06 % nasal spray Place 2 sprays into both nostrils 4 (four) times daily. (Patient not taking: Reported on 12/21/2019) 15 mL 0    Misc. Devices (GOJJI WEIGHT SCALE) MISC 1 Device by Does not apply route daily as needed. To weight self daily as needed at home. ICD-10 code: O09.90 1 each 0    pantoprazole (PROTONIX) 40 MG tablet Take 1 tablet (40 mg total) by mouth daily. (Patient not taking: Reported on 12/28/2019) 30 tablet 5    Prenatal Vit-Fe Fumarate-FA (PRENATAL MULTIVITAMIN) TABS tablet Take 1 tablet by mouth daily at 12 noon. (Patient not taking: Reported on 02/04/2020)      terconazole (TERAZOL 7) 0.4 % vaginal cream Place 1 applicator vaginally at bedtime. (Patient not taking: Reported on 01/28/2020) 45 g 0      Review of Systems   All systems reviewed and negative except as stated in HPI  Blood pressure (!) 135/58, pulse (!) 113, temperature 98.3 F (36.8 C), temperature source Oral, resp. rate 18, last menstrual period 04/23/2019, SpO2 98 %. General appearance: alert, cooperative and no distress Lungs: No increased WOB  Abdomen: soft, non-tender; bowel sounds normal Extremities: Homans sign is negative, no sign of DVT Presentation: cephalic Fetal monitoringBaseline: 150 bpm, Variability: Good {> 6 bpm), Accelerations: Reactive and  Decelerations: Absent Uterine activity Every 4-6 min  Dilation: Fingertip Effacement (%): 50 Exam by:: Cleone Slim CNM   Prenatal labs: ABO, Rh: A/Positive/-- (03/03 1556) Antibody: Comment, See Final Results (03/03 1556) Rubella: 3.20 (03/03 1556) RPR: Non Reactive (06/29 0910)  HBsAg: Negative (03/03 1556)  HIV: Non Reactive (06/29 0910)  GBS:  + in urine   Early A1c 5.3  Genetic screening NIPS low risk  Anatomy US normal  Prenatal Transfer Tool  Maternal Diabetes: No Genetic Screening: Normal Maternal Ultrasounds/Referrals: Normal Fetal Ultrasounds or other Referrals:  None Maternal Substance Abuse:  No Significant Maternal Medications:  None Significant Maternal Lab Results: Group B Strep positive  Results for orders placed or performed during the hospital encounter of 02/06/20 (from the past 24 hour(s))  CBC   Collection Time: 02/06/20  3:55 PM  Result Value Ref Range   WBC 9.7 4.0 - 10.5 K/uL   RBC 4.37 3.87 - 5.11 MIL/uL   Hemoglobin 10.5 (L) 12.0 - 15.0 g/dL   HCT 50.9 (L) 36 - 46 %   MCV 77.8 (L) 80.0 - 100.0 fL   MCH 24.0 (L) 26.0 - 34.0 pg   MCHC 30.9 30.0 - 36.0 g/dL   RDW 32.6 (H) 71.2 - 45.8 %   Platelets 201 150 - 400 K/uL   nRBC 0.0 0.0 - 0.2 %    Patient Active Problem List   Diagnosis Date Noted   Non-reactive NST (non-stress test) 02/06/2020   Significant discrepancy between uterine size and clinical dates, antepartum, second trimester 11/16/2019   Yeast vaginitis 10/14/2019   GBS (group B streptococcus) UTI complicating pregnancy 08/31/2019   Supervision of normal first pregnancy, antepartum 07/05/2019    Assessment/Plan:  Duke Salvia is a 19 y.o. G1P0 at [redacted]w[redacted]d here for IOL due decreased fetal movement and non-reactive NST. Pregnancy complicated by anemia and elevated BMI.   #Labor: Placed FB at 1730, will hold off on additional pit or cytotec for now to assess fetal tolerability before proceeding with further induction.    #Pain: Epidural  #FWB:  Cat 1 tracing/reactive currently which is reassuring, however given earlier cat II strip will monitor closely.  #ID: GBS positive, PCN  #MOF: Breast/formula  #MOC: nexplanon vs patch  #Circ: N/A   Allayne Stack, DO  02/06/2020, 4:23 PM  I was present for the exam and agree with above.  Katrinka Blazing, [place], [place] 02/06/2020 8:33 PM

## 2020-02-07 ENCOUNTER — Inpatient Hospital Stay (HOSPITAL_COMMUNITY): Payer: Medicaid Other | Admitting: Anesthesiology

## 2020-02-07 ENCOUNTER — Encounter (HOSPITAL_COMMUNITY): Payer: Self-pay | Admitting: Obstetrics and Gynecology

## 2020-02-07 LAB — RPR: RPR Ser Ql: NONREACTIVE

## 2020-02-07 MED ORDER — SODIUM CHLORIDE (PF) 0.9 % IJ SOLN
INTRAMUSCULAR | Status: DC | PRN
Start: 2020-02-07 — End: 2020-02-08
  Administered 2020-02-07: 11 mL/h via EPIDURAL

## 2020-02-07 MED ORDER — LACTATED RINGERS AMNIOINFUSION: INTRAVENOUS | Status: DC

## 2020-02-07 MED ORDER — LIDOCAINE HCL (PF) 1 % IJ SOLN
INTRAMUSCULAR | Status: DC | PRN
  Administered 2020-02-07: 3 mL via EPIDURAL
  Administered 2020-02-07: 4 mL via EPIDURAL

## 2020-02-07 NOTE — Progress Notes (Signed)
Labor Progress Note Michele Carney is a 19 y.o. G1P0 at [redacted]w[redacted]d presented for IOL-NRNST and DFM.  S: Doing well without complaints. More comfortable with epidural.  O:  BP 117/66   Pulse 100   Temp 98.3 F (36.8 C) (Oral)   Resp 18   Ht 5\' 1"  (1.549 m)   Wt 102.9 kg   LMP 04/23/2019   SpO2 96%   BMI 42.85 kg/m  EFM: baseline 150bpm/mod variability/+ accels/intermittent early decels with contractions, quick return to baseline Toco: q1-3 minutes  CVE: Dilation: 5 Effacement (%): 50 Cervical Position: Posterior Station: -1 Presentation: Vertex Exam by:: Dr. [email or web address]   A&P: 19 y.o. G1P0 [redacted]w[redacted]d presented for IOL-NRNST and DFM. #Labor: S/p FB, out around 2045. Cervical exam unchanged from prior. AROM with light mec performed @0100 . Continue to increase pitocin. Consider IUPC placement at next check if no cervical change. #Pain: CSE #FWB: cat 1 #GBS positive, PCN, adequate ppx  2046, MD 1:12 AM

## 2020-02-07 NOTE — Progress Notes (Signed)
Labor Progress Note Michele Carney is a 19 y.o. G1P0 at [redacted]w[redacted]d presented for IOL-NRNST and DFM.  S: Patient reports new onset of headache, describes as throbbing, occipital, 4/10 pain, improved with tylenol. Reports nausea which improved with Zofran, feet tingling, floaters in vision. Pit @ 10 mL/hr.  O:  BP (!) 105/58   Pulse 89   Temp 98.8 F (37.1 C) (Axillary)   Resp 16   Ht 5\' 1"  (1.549 m)   Wt 102.9 kg   LMP 04/23/2019   SpO2 97%   BMI 42.85 kg/m  EFM: baseline 150, moderate variability, + Accelerations, few variables that quickly returned to baseline.  Toco: cont q 2-3 mins, adequate strength   CVE: Dilation: 5.5 Effacement (%): 60 Cervical Position: Middle Station: -2 Presentation: Vertex Exam by:: [email or web address] RN   A&P: 19 y.o. G1P0 [redacted]w[redacted]d presented for IOL-NRNST and DFM. #Labor: s/p FB, AROM 0100 with light meconium, cervical exam unchanged from prior, continue to titrate pitocin. #Pain: CSE #FWB: cat 1 #GBS positive ,PCN, adequate ppx #HA: imp with tylenol, normotensive  [redacted]w[redacted]d, MD 1:51 PM

## 2020-02-07 NOTE — Progress Notes (Signed)
Labor Progress Note Michele Carney is a 19 y.o. G1P0 at [redacted]w[redacted]d presented for IOL-NRNST and DFM.  S: Doing well, can only feel a little pressure with contractions, no pain. Called to bedside as IUPC moved with recent repositioning and not tracking well. Pitocin is at 6.  O:  BP 128/66   Pulse 100   Temp 98.9 F (37.2 C) (Axillary)   Resp 15   Ht 5\' 1"  (1.549 m)   Wt 102.9 kg   LMP 04/23/2019   SpO2 97%   BMI 42.85 kg/m  EFM: baseline 150, minimal-moderate variability, + Accelerations, previously intermittent late decels none in last 10 minutes  CVE: Dilation: 5.5 Effacement (%): 60 Cervical Position: Posterior Station: -1 Presentation: Vertex Exam by:: Dr [email or web address]   A&P: 19 y.o. G1P0 [redacted]w[redacted]d presented for IOL-NRNST and DFM. #Labor: s/p FB, AROM 0100 with light meconium, cervical exam unchanged from prior, continue to titrate pitocin as able to achieve adequate contraction strength, IUPC repositioned on my exam. #Pain: CSE #FWB: cat 2 #GBS positive ,PCN, adequate ppx  [redacted]w[redacted]d, MD 10:53 AM

## 2020-02-07 NOTE — Progress Notes (Signed)
Labor Progress Note Michele Carney is a 19 y.o. G1P0 at [redacted]w[redacted]d presented for IOL-NRNST and DFM.  S: Doing well without complaints. Sleeping.  O:  BP (!) 109/40   Pulse (!) 108   Temp 98.4 F (36.9 C) (Oral)   Resp 18   Ht 5\' 1"  (1.549 m)   Wt 102.9 kg   LMP 04/23/2019   SpO2 97%   BMI 42.85 kg/m  EFM: baseline 150bpm/min-mod variability/+ accels/previously intermittent late decels with contractions, resolved with stopping pitocin and position changes Toco: q4-6 minutes  CVE: Dilation: 5.5 Effacement (%): 60 Cervical Position: Posterior Station: -1 Presentation: Vertex Exam by:: Dr [email or web address]   A&P: 19 y.o. G1P0 [redacted]w[redacted]d presented for IOL-NRNST and DFM. #Labor: S/p FB, AROM with light mec performed @0100 . Cervical exam unchanged from prior. Pitocin currently held given recurrent late decels. Will discuss restarting pitocin at sign out. #Pain: CSE #FWB: cat 2 #GBS positive, PCN, adequate ppx  [redacted]w[redacted]d, MD 7:49 AM

## 2020-02-07 NOTE — Progress Notes (Signed)
Labor Progress Note Michele Carney is a 19 y.o. G1P0 at [redacted]w[redacted]d presented for IOL-NRNST and DFM.  S: Feeling sleepy and a little nauseous  O:  BP 126/72   Pulse 86   Temp 98.5 F (36.9 C) (Axillary)   Resp 16   Ht 5\' 1"  (1.549 m)   Wt 102.9 kg   LMP 04/23/2019   SpO2 97%   BMI 42.85 kg/m  EFM: baseline 150, moderate variability, absent Accelerations, since late decel after laying flat for SVE  Toco: cont q 2-3 mins, adequate MVU   CVE: Dilation: 7 Effacement (%): 80 Cervical Position: Middle Station: -1 Presentation: Vertex Exam by:: [email or web address], MD  A&P: 19 y.o. G1P0 [redacted]w[redacted]d presented for IOL-NRNST and DFM. #Induction Labor: started on 9/19, s/p FB, AROM 0100 on 9/20 with light meconium. Had been 5cm since FB fell out, IUPC placed, has now made change to 7cm. Continue pitocin titration. Currently running at 4.   #Pain: epidural in place  #FWB: cat II, but reassuring given mod variability, quick return to baseline  #GBS positive ,PCN, adequate ppx  10/20, MD 11:35 PM

## 2020-02-07 NOTE — Anesthesia Procedure Notes (Signed)
Epidural Patient location during procedure: OB Start time: 02/07/2020 12:35 AM End time: 02/07/2020 12:47 AM  Staffing Anesthesiologist: Mal Amabile, MD Performed: anesthesiologist   Preanesthetic Checklist Completed: patient identified, IV checked, site marked, risks and benefits discussed, surgical consent, monitors and equipment checked, pre-op evaluation and timeout performed  Epidural Patient position: sitting Prep: DuraPrep and site prepped and draped Patient monitoring: continuous pulse ox and blood pressure Approach: midline Location: L3-L4 Injection technique: LOR air  Needle:  Needle type: Tuohy  Needle gauge: 17 G Needle length: 9 cm and 9 Needle insertion depth: 7 cm Catheter type: closed end flexible Catheter size: 19 Gauge Catheter at skin depth: 12 cm Test dose: negative and Other  Assessment Events: blood not aspirated, injection not painful, no injection resistance, no paresthesia and negative IV test  Additional Notes Patient identified. Risks and benefits discussed including failed block, incomplete  Pain control, post dural puncture headache, nerve damage, paralysis, blood pressure Changes, nausea, vomiting, reactions to medications-both toxic and allergic and post Partum back pain. All questions were answered. Patient expressed understanding and wished to proceed. Sterile technique was used throughout procedure. Epidural site was Dressed with sterile barrier dressing. No paresthesias, signs of intravascular injection Or signs of intrathecal spread were encountered.  Patient was more comfortable after the epidural was dosed. Please see RN's note for documentation of vital signs and FHR which are stable. Reason for block:procedure for pain

## 2020-02-07 NOTE — Anesthesia Preprocedure Evaluation (Signed)
Anesthesia Evaluation  Patient identified by MRN, date of birth, ID band Patient awake    Reviewed: Allergy & Precautions, Patient's Chart, lab work & pertinent test results  Airway Mallampati: II  TM Distance: >3 FB Neck ROM: Full    Dental no notable dental hx. (+) Teeth Intact   Pulmonary neg pulmonary ROS,    Pulmonary exam normal breath sounds clear to auscultation       Cardiovascular negative cardio ROS Normal cardiovascular exam Rhythm:Regular Rate:Normal     Neuro/Psych negative neurological ROS  negative psych ROS   GI/Hepatic GERD  Medicated,  Endo/Other  Morbid obesity  Renal/GU   negative genitourinary   Musculoskeletal negative musculoskeletal ROS (+)   Abdominal (+) + obese,   Peds  Hematology  (+) anemia ,   Anesthesia Other Findings   Reproductive/Obstetrics (+) Pregnancy                             Anesthesia Physical Anesthesia Plan  ASA: III  Anesthesia Plan: Epidural   Post-op Pain Management:    Induction:   PONV Risk Score and Plan:   Airway Management Planned: Natural Airway  Additional Equipment:   Intra-op Plan:   Post-operative Plan:   Informed Consent: I have reviewed the patients History and Physical, chart, labs and discussed the procedure including the risks, benefits and alternatives for the proposed anesthesia with the patient or authorized representative who has indicated his/her understanding and acceptance.       Plan Discussed with: Anesthesiologist  Anesthesia Plan Comments:         Anesthesia Quick Evaluation

## 2020-02-07 NOTE — Progress Notes (Addendum)
Labor Progress Note Michele Carney is a 19 y.o. G1P0 at [redacted]w[redacted]d presented for IOL-NRNST and DFM.  S: Patient continues to report mild headache, otherwise no complaint. RN stopped pit due to late decels, instructed to restart pit, restarted at 2 mL/hr.  O:  BP (!) 113/57   Pulse (!) 101   Temp 98.8 F (37.1 C) (Axillary)   Resp 15   Ht 5\' 1"  (1.549 m)   Wt 102.9 kg   LMP 04/23/2019   SpO2 97%   BMI 42.85 kg/m  EFM: baseline 150, moderate-minimal variability, + Accelerations, multiple late decels, but quickly came back to baseline and .  Toco: cont q 2-3 mins, adequate strength  CVE: Dilation: 5.5 Effacement (%): 50, 60 Cervical Position: Middle Station: -1 Presentation: Vertex Exam by:: Dr [email or web address]  A&P: 19 y.o. G1P0 [redacted]w[redacted]d presented for IOL-NRNST and DFM. #Labor: s/p FB, AROM 0100 with light meconium, continue to titrate pitocin. #Pain: CSE #FWB: cat 2 #GBS positive ,PCN, adequate ppx #HA: imp with tylenol, normotensive  [redacted]w[redacted]d, MD 5:40 PM

## 2020-02-07 NOTE — Progress Notes (Signed)
Labor Progress Note Michele Carney is a 19 y.o. G1P0 at [redacted]w[redacted]d presented for IOL-NRNST and DFM.  S: Doing well without complaints.   O:  BP (!) 112/42   Pulse (!) 103   Temp 98.3 F (36.8 C) (Oral)   Resp 18   Ht 5\' 1"  (1.549 m)   Wt 102.9 kg   LMP 04/23/2019   SpO2 97%   BMI 42.85 kg/m  EFM: baseline 150bpm/mod variability/+ accels/intermittent late decels with contractions, quick return to baseline Toco: q1-3 minutes  CVE: Dilation: 5.5 Effacement (%): 60 Cervical Position: Posterior Station: -1 Presentation: Vertex Exam by:: K. Cowher RN   A&P: 19 y.o. G1P0 [redacted]w[redacted]d presented for IOL-NRNST and DFM. #Labor: S/p FB, AROM with light mec performed @0100 . Cervical exam unchanged from prior. Given recurrent late decels RN requested to turn pitocin from 12>>>6cc/hr. IUPC placed at this check to ensure adequate contractions.  #Pain: CSE #FWB: cat 2 #GBS positive, PCN, adequate ppx  [redacted]w[redacted]d, MD 3:07 AM

## 2020-02-08 ENCOUNTER — Encounter (HOSPITAL_COMMUNITY): Payer: Self-pay | Admitting: Obstetrics and Gynecology

## 2020-02-08 ENCOUNTER — Encounter (HOSPITAL_COMMUNITY)
Admission: AD | Disposition: A | Discharge status: Home / Self Care | Payer: Self-pay | Source: Home / Self Care | Attending: Obstetrics & Gynecology

## 2020-02-08 DIAGNOSIS — Z3A39 39 weeks gestation of pregnancy: Secondary | ICD-10-CM

## 2020-02-08 SURGERY — Surgical Case
Anesthesia: Epidural

## 2020-02-08 MED ORDER — DEXAMETHASONE SODIUM PHOSPHATE 10 MG/ML IJ SOLN
INTRAMUSCULAR | Status: AC
  Filled 2020-02-08: qty 1

## 2020-02-08 MED ORDER — TETANUS-DIPHTH-ACELL PERTUSSIS 5-2.5-18.5 LF-MCG/0.5 IM SUSP: 0.5000 mL | Freq: Once | INTRAMUSCULAR | Status: DC

## 2020-02-08 MED ORDER — LACTATED RINGERS IV SOLN: INTRAVENOUS | Status: DC | PRN

## 2020-02-08 MED ORDER — MEPERIDINE HCL 25 MG/ML IJ SOLN: 6.2500 mg | INTRAMUSCULAR | Status: DC | PRN

## 2020-02-08 MED ORDER — ENOXAPARIN SODIUM 60 MG/0.6ML ~~LOC~~ SOLN
50.0000 mg | SUBCUTANEOUS | Status: DC
  Administered 2020-02-09 – 2020-02-10 (×2): 50 mg via SUBCUTANEOUS
  Filled 2020-02-08 (×2): qty 0.6

## 2020-02-08 MED ORDER — SODIUM CHLORIDE 0.9 % IR SOLN
Status: DC | PRN
  Administered 2020-02-08: 1

## 2020-02-08 MED ORDER — OXYTOCIN-SODIUM CHLORIDE 30-0.9 UT/500ML-% IV SOLN: 2.5000 [IU]/h | INTRAVENOUS | Status: AC

## 2020-02-08 MED ORDER — SIMETHICONE 80 MG PO CHEW: 80.0000 mg | CHEWABLE_TABLET | ORAL | Status: DC | PRN

## 2020-02-08 MED ORDER — NALBUPHINE HCL 10 MG/ML IJ SOLN: 5.0000 mg | Freq: Once | INTRAMUSCULAR | Status: DC | PRN

## 2020-02-08 MED ORDER — DEXAMETHASONE SODIUM PHOSPHATE 4 MG/ML IJ SOLN
INTRAMUSCULAR | Status: DC | PRN
  Administered 2020-02-08: 10 mg via INTRAVENOUS

## 2020-02-08 MED ORDER — OXYCODONE HCL 5 MG PO TABS: 5.0000 mg | ORAL_TABLET | Freq: Once | ORAL | Status: DC | PRN

## 2020-02-08 MED ORDER — SIMETHICONE 80 MG PO CHEW
80.0000 mg | CHEWABLE_TABLET | Freq: Three times a day (TID) | ORAL | Status: DC
  Administered 2020-02-08 – 2020-02-10 (×5): 80 mg via ORAL
  Filled 2020-02-08 (×5): qty 1

## 2020-02-08 MED ORDER — COCONUT OIL OIL: 1.0000 "application " | TOPICAL_OIL | Status: DC | PRN

## 2020-02-08 MED ORDER — KETOROLAC TROMETHAMINE 30 MG/ML IJ SOLN
INTRAMUSCULAR | Status: AC
  Filled 2020-02-08: qty 1

## 2020-02-08 MED ORDER — ONDANSETRON HCL 4 MG/2ML IJ SOLN
INTRAMUSCULAR | Status: AC
  Filled 2020-02-08: qty 2

## 2020-02-08 MED ORDER — TRANEXAMIC ACID-NACL 1000-0.7 MG/100ML-% IV SOLN
INTRAVENOUS | Status: AC
  Filled 2020-02-08: qty 100

## 2020-02-08 MED ORDER — SIMETHICONE 80 MG PO CHEW
80.0000 mg | CHEWABLE_TABLET | ORAL | Status: DC
  Administered 2020-02-08 – 2020-02-09 (×2): 80 mg via ORAL
  Filled 2020-02-08 (×2): qty 1

## 2020-02-08 MED ORDER — NALBUPHINE HCL 10 MG/ML IJ SOLN: 5.0000 mg | INTRAMUSCULAR | Status: DC | PRN

## 2020-02-08 MED ORDER — SODIUM CHLORIDE 0.9 % IV SOLN
INTRAVENOUS | Status: DC | PRN
  Administered 2020-02-08: 250 mg via INTRAVENOUS

## 2020-02-08 MED ORDER — WITCH HAZEL-GLYCERIN EX PADS: 1.0000 "application " | MEDICATED_PAD | CUTANEOUS | Status: DC | PRN

## 2020-02-08 MED ORDER — NALOXONE HCL 0.4 MG/ML IJ SOLN: 0.4000 mg | INTRAMUSCULAR | Status: DC | PRN

## 2020-02-08 MED ORDER — OXYCODONE HCL 5 MG/5ML PO SOLN: 5.0000 mg | Freq: Once | ORAL | Status: DC | PRN

## 2020-02-08 MED ORDER — MORPHINE SULFATE (PF) 0.5 MG/ML IJ SOLN
INTRAMUSCULAR | Status: AC
  Filled 2020-02-08: qty 10

## 2020-02-08 MED ORDER — PROMETHAZINE HCL 25 MG/ML IJ SOLN: 6.2500 mg | INTRAMUSCULAR | Status: DC | PRN

## 2020-02-08 MED ORDER — KETOROLAC TROMETHAMINE 30 MG/ML IJ SOLN
30.0000 mg | Freq: Four times a day (QID) | INTRAMUSCULAR | Status: AC
  Administered 2020-02-08 – 2020-02-09 (×3): 30 mg via INTRAVENOUS
  Filled 2020-02-08 (×3): qty 1

## 2020-02-08 MED ORDER — SCOPOLAMINE 1 MG/3DAYS TD PT72
MEDICATED_PATCH | TRANSDERMAL | Status: AC
  Filled 2020-02-08: qty 1

## 2020-02-08 MED ORDER — SENNOSIDES-DOCUSATE SODIUM 8.6-50 MG PO TABS
2.0000 | ORAL_TABLET | ORAL | Status: DC
  Administered 2020-02-08 – 2020-02-09 (×2): 2 via ORAL
  Filled 2020-02-08 (×2): qty 2

## 2020-02-08 MED ORDER — TRANEXAMIC ACID-NACL 1000-0.7 MG/100ML-% IV SOLN
INTRAVENOUS | Status: DC | PRN
  Administered 2020-02-08: 1000 mg via INTRAVENOUS

## 2020-02-08 MED ORDER — IBUPROFEN 800 MG PO TABS
800.0000 mg | ORAL_TABLET | Freq: Four times a day (QID) | ORAL | Status: DC
  Administered 2020-02-09 – 2020-02-10 (×4): 800 mg via ORAL
  Filled 2020-02-08 (×4): qty 1

## 2020-02-08 MED ORDER — LIDOCAINE-EPINEPHRINE (PF) 2 %-1:200000 IJ SOLN
INTRAMUSCULAR | Status: DC | PRN
  Administered 2020-02-08: 10 mL via EPIDURAL

## 2020-02-08 MED ORDER — PRENATAL MULTIVITAMIN CH
1.0000 | ORAL_TABLET | Freq: Every day | ORAL | Status: DC
  Administered 2020-02-09 – 2020-02-10 (×2): 1 via ORAL
  Filled 2020-02-08 (×2): qty 1

## 2020-02-08 MED ORDER — MORPHINE SULFATE (PF) 10 MG/ML IV SOLN
INTRAVENOUS | Status: DC | PRN
Start: 2020-02-08 — End: 2020-02-08
  Administered 2020-02-08: 3 mg via EPIDURAL

## 2020-02-08 MED ORDER — SODIUM CHLORIDE 0.9 % IV SOLN
INTRAVENOUS | Status: AC
  Filled 2020-02-08: qty 500

## 2020-02-08 MED ORDER — STERILE WATER FOR IRRIGATION IR SOLN
Status: DC | PRN
  Administered 2020-02-08: 1000 mL

## 2020-02-08 MED ORDER — DIBUCAINE (PERIANAL) 1 % EX OINT: 1.0000 "application " | TOPICAL_OINTMENT | CUTANEOUS | Status: DC | PRN

## 2020-02-08 MED ORDER — DIPHENHYDRAMINE HCL 25 MG PO CAPS: 25.0000 mg | ORAL_CAPSULE | Freq: Four times a day (QID) | ORAL | Status: DC | PRN

## 2020-02-08 MED ORDER — CEFAZOLIN SODIUM-DEXTROSE 2-3 GM-%(50ML) IV SOLR
INTRAVENOUS | Status: DC | PRN
  Administered 2020-02-08: 2 g via INTRAVENOUS

## 2020-02-08 MED ORDER — ONDANSETRON HCL 4 MG/2ML IJ SOLN
INTRAMUSCULAR | Status: DC | PRN
  Administered 2020-02-08: 4 mg via INTRAVENOUS

## 2020-02-08 MED ORDER — DEXMEDETOMIDINE (PRECEDEX) IN NS 20 MCG/5ML (4 MCG/ML) IV SYRINGE
PREFILLED_SYRINGE | INTRAVENOUS | Status: DC | PRN
  Administered 2020-02-08 (×2): 4 ug via INTRAVENOUS
  Administered 2020-02-08: 8 ug via INTRAVENOUS

## 2020-02-08 MED ORDER — DIPHENHYDRAMINE HCL 25 MG PO CAPS: 25.0000 mg | ORAL_CAPSULE | ORAL | Status: DC | PRN

## 2020-02-08 MED ORDER — KETOROLAC TROMETHAMINE 30 MG/ML IJ SOLN: 30.0000 mg | Freq: Once | INTRAMUSCULAR | Status: DC | PRN

## 2020-02-08 MED ORDER — OXYTOCIN-SODIUM CHLORIDE 30-0.9 UT/500ML-% IV SOLN
INTRAVENOUS | Status: DC | PRN
  Administered 2020-02-08: 300 mL via INTRAVENOUS
  Administered 2020-02-08: 100 mL via INTRAVENOUS

## 2020-02-08 MED ORDER — DIPHENHYDRAMINE HCL 50 MG/ML IJ SOLN: 12.5000 mg | INTRAMUSCULAR | Status: DC | PRN

## 2020-02-08 MED ORDER — MENTHOL 3 MG MT LOZG: 1.0000 | LOZENGE | OROMUCOSAL | Status: DC | PRN

## 2020-02-08 MED ORDER — ACETAMINOPHEN 500 MG PO TABS
1000.0000 mg | ORAL_TABLET | Freq: Three times a day (TID) | ORAL | Status: DC
  Administered 2020-02-08 – 2020-02-10 (×5): 1000 mg via ORAL
  Filled 2020-02-08 (×5): qty 2

## 2020-02-08 MED ORDER — NALOXONE HCL 4 MG/10ML IJ SOLN
1.0000 ug/kg/h | INTRAVENOUS | Status: DC | PRN
  Filled 2020-02-08: qty 5

## 2020-02-08 MED ORDER — MEASLES, MUMPS & RUBELLA VAC IJ SOLR: 0.5000 mL | Freq: Once | INTRAMUSCULAR | Status: DC

## 2020-02-08 MED ORDER — TERBUTALINE SULFATE 1 MG/ML IJ SOLN
0.2500 mg | Freq: Once | INTRAMUSCULAR | Status: AC
  Administered 2020-02-08: 0.25 mg via SUBCUTANEOUS

## 2020-02-08 MED ORDER — LACTATED RINGERS IV SOLN: INTRAVENOUS | Status: DC

## 2020-02-08 MED ORDER — SODIUM CHLORIDE 0.9% FLUSH: 3.0000 mL | INTRAVENOUS | Status: DC | PRN

## 2020-02-08 MED ORDER — HYDROMORPHONE HCL 1 MG/ML IJ SOLN: 0.2500 mg | INTRAMUSCULAR | Status: DC | PRN

## 2020-02-08 MED ORDER — KETOROLAC TROMETHAMINE 30 MG/ML IJ SOLN
30.0000 mg | Freq: Four times a day (QID) | INTRAMUSCULAR | Status: AC | PRN
  Administered 2020-02-08 (×2): 30 mg via INTRAVENOUS
  Filled 2020-02-08: qty 1

## 2020-02-08 MED ORDER — ACETAMINOPHEN 500 MG PO TABS
1000.0000 mg | ORAL_TABLET | Freq: Four times a day (QID) | ORAL | Status: DC
  Administered 2020-02-08 (×2): 1000 mg via ORAL
  Filled 2020-02-08 (×2): qty 2

## 2020-02-08 MED ORDER — KETOROLAC TROMETHAMINE 30 MG/ML IJ SOLN: 30.0000 mg | Freq: Four times a day (QID) | INTRAMUSCULAR | Status: AC | PRN

## 2020-02-08 MED ORDER — SCOPOLAMINE 1 MG/3DAYS TD PT72
1.0000 | MEDICATED_PATCH | Freq: Once | TRANSDERMAL | Status: DC
  Administered 2020-02-08: 1.5 mg via TRANSDERMAL

## 2020-02-08 MED ORDER — OXYCODONE HCL 5 MG PO TABS
5.0000 mg | ORAL_TABLET | ORAL | Status: DC | PRN
  Administered 2020-02-09 – 2020-02-10 (×2): 10 mg via ORAL
  Administered 2020-02-10: 5 mg via ORAL
  Administered 2020-02-10: 10 mg via ORAL
  Filled 2020-02-08: qty 2
  Filled 2020-02-08: qty 1
  Filled 2020-02-08: qty 2
  Filled 2020-02-08: qty 1
  Filled 2020-02-08: qty 2

## 2020-02-08 MED ORDER — ONDANSETRON HCL 4 MG/2ML IJ SOLN: 4.0000 mg | Freq: Three times a day (TID) | INTRAMUSCULAR | Status: DC | PRN

## 2020-02-08 SURGICAL SUPPLY — 40 items
ADH SKN CLS APL DERMABOND .7 (GAUZE/BANDAGES/DRESSINGS) ×1
CHLORAPREP W/TINT 26ML (MISCELLANEOUS) ×3 IMPLANT
CLAMP CORD UMBIL (MISCELLANEOUS) IMPLANT
CLOTH BEACON ORANGE TIMEOUT ST (SAFETY) ×3 IMPLANT
DERMABOND ADVANCED (GAUZE/BANDAGES/DRESSINGS) ×2
DERMABOND ADVANCED .7 DNX12 (GAUZE/BANDAGES/DRESSINGS) ×2 IMPLANT
DRSG OPSITE POSTOP 4X10 (GAUZE/BANDAGES/DRESSINGS) ×3 IMPLANT
ELECT REM PT RETURN 9FT ADLT (ELECTROSURGICAL) ×3
ELECTRODE REM PT RTRN 9FT ADLT (ELECTROSURGICAL) ×1 IMPLANT
EXTRACTOR VACUUM BELL STYLE (SUCTIONS) IMPLANT
GLOVE BIOGEL PI IND STRL 7.0 (GLOVE) ×1 IMPLANT
GLOVE BIOGEL PI IND STRL 8 (GLOVE) ×1 IMPLANT
GLOVE BIOGEL PI INDICATOR 7.0 (GLOVE) ×2
GLOVE BIOGEL PI INDICATOR 8 (GLOVE) ×2
GLOVE ECLIPSE 8.0 STRL XLNG CF (GLOVE) ×3 IMPLANT
GOWN STRL REUS W/TWL LRG LVL3 (GOWN DISPOSABLE) ×6 IMPLANT
KIT ABG SYR 3ML LUER SLIP (SYRINGE) ×3 IMPLANT
NDL HYPO 18GX1.5 BLUNT FILL (NEEDLE) ×1 IMPLANT
NDL HYPO 25X5/8 SAFETYGLIDE (NEEDLE) ×1 IMPLANT
NEEDLE HYPO 18GX1.5 BLUNT FILL (NEEDLE) ×3 IMPLANT
NEEDLE HYPO 22GX1.5 SAFETY (NEEDLE) ×3 IMPLANT
NEEDLE HYPO 25X5/8 SAFETYGLIDE (NEEDLE) ×3 IMPLANT
NS IRRIG 1000ML POUR BTL (IV SOLUTION) ×3 IMPLANT
PACK C SECTION WH (CUSTOM PROCEDURE TRAY) ×3 IMPLANT
PAD OB MATERNITY 4.3X12.25 (PERSONAL CARE ITEMS) ×3 IMPLANT
PENCIL SMOKE EVAC W/HOLSTER (ELECTROSURGICAL) ×3 IMPLANT
RTRCTR C-SECT PINK 25CM LRG (MISCELLANEOUS) IMPLANT
SUT CHROMIC 0 CT 1 (SUTURE) ×3 IMPLANT
SUT MNCRL 0 VIOLET CTX 36 (SUTURE) ×2 IMPLANT
SUT MONOCRYL 0 CTX 36 (SUTURE) ×6
SUT PLAIN 2 0 (SUTURE)
SUT PLAIN 2 0 XLH (SUTURE) ×2 IMPLANT
SUT PLAIN ABS 2-0 CT1 27XMFL (SUTURE) IMPLANT
SUT VIC AB 0 CTX 36 (SUTURE) ×3
SUT VIC AB 0 CTX36XBRD ANBCTRL (SUTURE) ×1 IMPLANT
SUT VIC AB 4-0 KS 27 (SUTURE) IMPLANT
SYR 20CC LL (SYRINGE) ×6 IMPLANT
TOWEL OR 17X24 6PK STRL BLUE (TOWEL DISPOSABLE) ×3 IMPLANT
TRAY FOLEY W/BAG SLVR 14FR LF (SET/KITS/TRAYS/PACK) IMPLANT
WATER STERILE IRR 1000ML POUR (IV SOLUTION) ×3 IMPLANT

## 2020-02-08 NOTE — Lactation Note (Addendum)
This note was copied from a baby's chart. 2034:  Lactation Consultation Note  Patient Name: Michele Carney [facility] Today's Date: 02/08/2020  Mother is sleeping upon visit. FOB states he just bottle-fed infant ~50mL of formula.  Reviewed with mother average size of a NB stomach and formula supplementation guidelines. Discussed pace bottle feeding benefits and demonstrated technique. Encouraged to contact [facility] for support, questions or concerns.    LC will come back to room at another time as possible.    Malarie Tappen A Higuera Ancidey 02/08/2020, 8:34 PM  ______________________________________________________________________  3382:  Lactation Consultation Note  Patient Name: Michele Carney Today's Date: 02/08/2020 Reason for consult: Initial assessment;Primapara;1st time breastfeeding;Difficult latch;Term  Initial visit to 36 hours old infant of a P1 mother. Mother reports infant has not been able to breastfeed. Mother states it is due to her flat nipples, infant gets angry and does not suck. Parents have been supplementing with formula. Infant is showing hunger cues upon arrival. Mother is eager to try. Attempted latch to left breast, football position. Infant hold nipple in mouth, no sucking. After several attempts, mother preferred to bottle-fed formula. Talked about paced-bottle feeding and infant took 15 mL. Changed a dirty diaper with smear of meconium. Discussed supplementation guidelines and stomach size. Encouraged frequent burping and sit up position. Mother may be interested in trying a nipple shield at another time.  Infant is skin to skin with mother upon departure. Encouraged to contact lactation services as needed for any questions or concerns.   Maternal Data Formula Feeding for Exclusion: No Has patient been taught Hand Expression?: Yes Does the patient have breastfeeding experience prior to this delivery?: No  Feeding Feeding Type: Bottle Fed -  Formula Nipple Type: Regular  LATCH Score Latch: Too sleepy or reluctant, no latch achieved, no sucking elicited.  Audible Swallowing: None  Type of Nipple: Flat  Comfort (Breast/Nipple): Soft / non-tender  Hold (Positioning): Assistance needed to correctly position infant at breast and maintain latch.  LATCH Score: 4  Interventions Interventions: Breast feeding basics reviewed;Assisted with latch;Skin to skin;Hand express;Adjust position;Support pillows;Position options;Expressed milk  Lactation Tools Discussed/Used Tools: Bottle WIC Program: Yes   Consult Status Consult Status: Follow-up Date: 02/09/20 Follow-up type: In-patient    [place] Coldren A Higuera Ancidey 02/08/2020, 11:17 PM

## 2020-02-08 NOTE — Op Note (Signed)
Cesarean Section Procedure Note   Michele Carney [facility]  02/08/2020  Indications: Fetal Distress, arrest of dilation   Pre-operative Diagnosis: Unscheduled Cesarean Section, See Delivery Summary, Non-reassuring fetal heart rate tracing .   Post-operative Diagnosis: Same   Surgeon: Surgeon(s) and Role:    * Eure, Amaryllis Dyke, MD - Primary    Assistants: Casper Harrison, MD   Anesthesia: epidural    Estimated Blood Loss: 754cc   Total IV Fluids:   Urine Output: 800cc  Specimens: Placenta to Path  Findings:  Baby condition / location:  Couplet care / Skin to Skin weight pending  APGAR: 8 & 9     Complications: no complications  Indications: Michele Carney is a 19 y.o. G1P1001 with an IUP [redacted]w[redacted]d presented to L&D for IOL for decreased fetal movement. She received FB, cytotec, pitocin. She progressed to 5cm and made little change for many hours. She had an IUPC placed that demonstrated adequate MVUs. She progressed to 7 and remained at 7cm for over four hours. The Fetal tracing became persistently category II with with recurrent late decelerations and slower return to baseline. Decision was made to proceed w pLTCS for NRFA and arrest of dilation.    The risks, benefits, complications, treatment options, and expected outcomes were discussed with the patient . The patient concurred with the proposed plan, giving informed consent. identified as Michele Carney and the procedure verified as C-Section Delivery.  Procedure Details: A Time Out was held and the above information confirmed.  The patient was taken back to the operative suite where anesthesia was assessed and determined to be adequate.   After induction of anesthesia, the patient was draped and prepped in the usual sterile manner and placed in a dorsal supine position with a leftward tilt. A pfannenstiel incision was made and carried down through the subcutaneous tissue to the fascia. Fascial incision  was made and extended transversely. The fascia was separated from the underlying rectus tissue superiorly and inferiorly. The peritoneum was identified and entered. Peritoneal incision was extended longitudinally. The utero-vesical peritoneal reflection was incised transversely and the bladder flap was bluntly freed from the lower uterine segment. A low transverse uterine incision was made. Delivered from cephalic presentation was a viable female infant with Apgar scores of 8 at one minute and 9 at five minutes. Cord ph was sent the umbilical cord was clamped and cut cord blood was obtained for evaluation. The placenta was removed Intact and appeared normal. The uterine outline, tubes and ovaries appeared normal. The uterine incision was closed with running locked sutures of 0 monocryl.  A second imbricated layer was completed. Hemostasis was observed. Lavage was carried out until clear. The fascia was then reapproximated with running sutures of 0Vicryl. The subcuticular closure was performed using gut. The skin was closed with 4-0Vicryl.   Instrument, sponge, and needle counts were correct prior the abdominal closure and were correct at the conclusion of the case.    Disposition: PACU - hemodynamically stable.   Maternal Condition: stable     Signed: Gita Kudo MD 02/08/2020 7:35 AM OB Family Medicine Fellow, [facility] for [employer], [facility]

## 2020-02-08 NOTE — Anesthesia Postprocedure Evaluation (Signed)
Anesthesia Post Note  Patient: Michele Carney  Procedure(s) Performed: CESAREAN SECTION (N/A )     Patient location during evaluation: PACU Anesthesia Type: Epidural Level of consciousness: awake Pain management: pain level controlled Vital Signs Assessment: post-procedure vital signs reviewed and stable Respiratory status: spontaneous breathing Cardiovascular status: stable Postop Assessment: no headache, no backache, epidural receding, patient able to bend at knees and no apparent nausea or vomiting Anesthetic complications: no   No complications documented.  Last Vitals:  Vitals:   02/08/20 0830 02/08/20 0844  BP:  138/71  Pulse: 88 87  Resp: (!) 23   Temp: 36.7 C 37 C  SpO2: 97%     Last Pain:  Vitals:   02/08/20 0830  TempSrc: Oral  PainSc: 1    Pain Goal: Patients Stated Pain Goal: 0 (02/07/20 1247)                 Caren Macadam

## 2020-02-08 NOTE — Progress Notes (Signed)
Labor Progress Note Michele Carney is a 19 y.o. G1P0 at [redacted]w[redacted]d presented for IOL-NRNST and DFM.  S: Feeling tired  O:  BP (!) 135/58   Pulse (!) 107   Temp 99 F (37.2 C) (Axillary)   Resp 17   Ht 5\' 1"  (1.549 m)   Wt 102.9 kg   LMP 04/23/2019   SpO2 97%   BMI 42.85 kg/m  EFM: baseline 150, moderate variability, absent Accelerations, multiple prolonged late decels  Toco: cont q 1-2 min  CVE: Dilation: 7.5 Effacement (%): 90 Cervical Position: Middle Station: 0 Presentation: Vertex Exam by:: [email or web address], Rn  A&P: 19 y.o. G1P0 [redacted]w[redacted]d presented for IOL-NRNST and DFM. #Induction Labor: started on 9/19, s/p FB, AROM 0100 on 9/20 with light meconium. Had been 5cm since FB fell out, IUPC placed, made change to 7cm. Multiple prolonged decelerations, pitocin stopped and patient given terbutaline. At this time decision made to proceed with pLTCS for NRFA remote from delivery.  The risks of surgery were discussed with the patient including but were not limited to: bleeding which may require transfusion or reoperation; infection which may require antibiotics; injury to bowel, bladder, ureters or other surrounding organs; injury to the fetus; need for additional procedures including hysterectomy in the event of a life-threatening hemorrhage; formation of adhesions; placental abnormalities wth subsequent pregnancies; incisional problems; thromboembolic phenomenon and other postoperative/anesthesia complications.  The patient concurred with the proposed plan, giving informed written consent for the procedure.   Patient has been NPO since 2100 she will remain NPO for procedure. Anesthesia and OR aware. Preoperative prophylactic antibiotics and SCDs ordered on call to the OR.  To OR when ready.  #Pain: epidural in place  #FWB: cat II  #GBS positive ,PCN, adequate ppx  2101, MD 4:55 AM

## 2020-02-08 NOTE — Transfer of Care (Signed)
Immediate Anesthesia Transfer of Care Note  Patient: Michele Carney  Procedure(s) Performed: CESAREAN SECTION (N/A )  Patient Location: PACU  Anesthesia Type:Epidural  Level of Consciousness: awake, alert  and oriented  Airway & Oxygen Therapy: Patient Spontanous Breathing  Post-op Assessment: Report given to RN and Post -op Vital signs reviewed and stable  Post vital signs: Reviewed and stable  Last Vitals:  Vitals Value Taken Time  BP 112/45 02/08/20 0736  Temp    Pulse 109 02/08/20 0739  Resp 18 02/08/20 0739  SpO2 100 % 02/08/20 0739  Vitals shown include unvalidated device data.  Last Pain:  Vitals:   02/08/20 0600  TempSrc:   PainSc: 0-No pain      Patients Stated Pain Goal: 0 (02/07/20 1247)  Complications: No complications documented.

## 2020-02-08 NOTE — Discharge Summary (Signed)
Postpartum Discharge Summary     Patient Name: Michele Carney DOB: [birth year] MRN: [id number]  Date of admission: 02/06/2020 Delivery date:02/08/2020  Delivering provider: Florian Buff  Date of discharge: 02/10/2020  Admitting diagnosis: Non-reactive NST (non-stress test) [O28.8] Intrauterine pregnancy: [redacted]w[redacted]d    Secondary diagnosis:  Principal Problem:   Cesarean delivery delivered Active Problems:   GBS (group B streptococcus) UTI complicating pregnancy   Non-reactive NST (non-stress test)   Iron deficiency anemia  Additional problems:     Discharge diagnosis: Term Pregnancy Delivered                                              Post partum procedures:none Augmentation: AROM, Pitocin, Cytotec and IP Foley Complications: None  Hospital course: Induction of Labor With Cesarean Section   19y.o. yo G1P1001 at 328w2das admitted to the hospital 02/06/2020 for induction of labor. She received FB, cytotec, pitocin. She progressed to 5cm and made little change for many hours. She had an IUPC placed that demonstrated adequate MVUs. She progressed to 7 and remained at 7cm for over four hours. The Fetal tracing became persistently category II with with recurrent late decelerations and slower return to baseline. Decision was made to proceed w pLTCS for NRFA and arrest of dilation.   The patient went for cesarean section due to Arrest of Dilation and Non-Reassuring FHR. Delivery details are as follows: Membrane Rupture Time/Date: 1:08 AM ,02/07/2020   Delivery Method:C-Section, Low Transverse  Details of operation can be found in separate operative Note.  Patient had an uncomplicated postpartum course. She is ambulating, tolerating a regular diet, passing flatus, and urinating well.  Patient is discharged home in stable condition on 02/10/20.      Newborn Data: Birth date:02/08/2020  Birth time:6:42 AM  Gender:Female  Living status:Living  Apgars:8 ,9  Weight:3495 g                                  Magnesium Sulfate received: No BMZ received: No Rhophylac:No MMR:No T-DaP:Given prenatally Flu: No Transfusion:No  Physical exam  Vitals:   02/09/20 0800 02/09/20 1522 02/09/20 2132 02/10/20 0535  BP: 105/66 113/64 126/68 121/70  Pulse: 92 96 90 98  Resp: 16   16  Temp: 98.2 F (36.8 C)  99 F (37.2 C) 98.3 F (36.8 C)  TempSrc: Oral  Oral Oral  SpO2:  99% 98% 100%  Weight:      Height:       General: alert, cooperative and no distress Lochia: appropriate Uterine Fundus: firm Incision: Healing well with no significant drainage DVT Evaluation: No evidence of DVT seen on physical exam. Labs: Lab Results  Component Value Date   WBC 10.7 (H) 02/09/2020   HGB 7.4 (L) 02/09/2020   HCT 23.7 (L) 02/09/2020   MCV 80.1 02/09/2020   PLT 158 02/09/2020   CMP Latest Ref Rng & Units 02/09/2020  Glucose 70 - 99 mg/dL -  BUN 6 - 20 mg/dL -  Creatinine 0.44 - 1.00 mg/dL 0.50  Sodium 135 - 145 mmol/L -  Potassium 3.5 - 5.1 mmol/L -  Chloride 98 - 111 mmol/L -  CO2 22 - 32 mmol/L -  Calcium 8.9 - 10.3 mg/dL -  Total Protein 6.5 - 8.1  g/dL -  Total Bilirubin 0.3 - 1.2 mg/dL -  Alkaline Phos 38 - 126 U/L -  AST 15 - 41 U/L -  ALT 0 - 44 U/L -   Edinburgh Score: Edinburgh Postnatal Depression Scale Screening Tool 02/08/2020  I have been able to laugh and see the funny side of things. 1  I have looked forward with enjoyment to things. 0  I have blamed myself unnecessarily when things went wrong. 0  I have been anxious or worried for no good reason. 0  I have felt scared or panicky for no good reason. 1  Things have been getting on top of me. 1  I have been so unhappy that I have had difficulty sleeping. 0  I have felt sad or miserable. 2  I have been so unhappy that I have been crying. 0  The thought of harming myself has occurred to me. 0  Edinburgh Postnatal Depression Scale Total 5     After visit meds:  Allergies as of 02/10/2020   No Known  Allergies     Medication List    STOP taking these medications   pantoprazole 40 MG tablet Commonly known as: Protonix   terconazole 0.4 % vaginal cream Commonly known as: Terazol 7     TAKE these medications   acetaminophen 325 MG tablet Commonly known as: TYLENOL Take 650 mg by mouth every 6 (six) hours as needed. What changed: Another medication with the same name was added. Make sure you understand how and when to take each.   acetaminophen 500 MG tablet Commonly known as: TYLENOL Take 2 tablets (1,000 mg total) by mouth every 8 (eight) hours. What changed: You were already taking a medication with the same name, and this prescription was added. Make sure you understand how and when to take each.   Blood Pressure Monitor Automat Devi 1 Device by Does not apply route daily. Automatic blood pressure cuff regular size. To monitor blood pressure regularly at home. ICD-10 code: O60.90   [place] 1 Device by Does not apply route daily.   cyclobenzaprine 10 MG tablet Commonly known as: FLEXERIL Take 1 tablet (10 mg total) by mouth every 8 (eight) hours as needed for muscle spasms.   ferrous sulfate 325 (65 FE) MG tablet Take 1 tablet (325 mg total) by mouth every other day. What changed: Another medication with the same name was changed. Make sure you understand how and when to take each.   Iron 325 (65 Fe) MG Tabs Take 1 tablet (325 mg total) by mouth every other day. What changed:   medication strength  how much to take  when to take this   fluconazole 150 MG tablet Commonly known as: DIFLUCAN Take one tablet now and 1 tablet in 3 days if symptoms continue.   fluticasone 50 MCG/ACT nasal spray Commonly known as: FLONASE Place 2 sprays into both nostrils daily.   [place]ji Weight Scale Misc 1 Device by Does not apply route daily as needed. To weight self daily as needed at home. ICD-10 code: O09.90   ibuprofen 800 MG tablet Commonly  known as: ADVIL Take 1 tablet (800 mg total) by mouth every 6 (six) hours.   ipratropium 0.06 % nasal spray Commonly known as: ATROVENT Place 2 sprays into both nostrils 4 (four) times daily.   oxyCODONE 5 MG immediate release tablet Commonly known as: Oxy IR/ROXICODONE Take 1-2 tablets (5-10 mg total) by mouth every 4 (four) hours  as needed for moderate pain.   Prenatal Adult Gummy/DHA/FA 0.4-25 MG Chew Chew 1 tablet by mouth daily.   prenatal multivitamin Tabs tablet Take 1 tablet by mouth daily at 12 noon.        Discharge home in stable condition Infant Feeding: Breast Infant Disposition:home with mother Discharge instruction: per After Visit Summary and Postpartum booklet. Activity: Advance as tolerated. Pelvic rest for 6 weeks.  Diet: routine diet, PO iron supplements  Future Appointments: Future Appointments  Date Time Provider [place]  02/17/2020 10:40 AM [place] None  03/07/2020  1:00 PM Cephas Darby, MD [place] None   Follow up Visit:   Please schedule this patient for a In person postpartum visit in 6 weeks with the following provider: Any provider. Additional Postpartum F/U:Incision check 1 week  Low risk pregnancy complicated by: na Delivery mode:  C-Section, Low Transverse  Anticipated Birth Control:  Unsure    02/10/2020 Janet Berlin, MD

## 2020-02-09 ENCOUNTER — Encounter (HOSPITAL_COMMUNITY): Payer: Self-pay | Admitting: Obstetrics & Gynecology

## 2020-02-09 LAB — CREATININE, SERUM
Creatinine, Ser: 0.5 mg/dL (ref 0.44–1.00)
GFR calc Af Amer: >60 mL/min (ref >60)
GFR calc non Af Amer: >60 mL/min (ref >60)

## 2020-02-09 LAB — CBC
HCT: 23.7 % — ABNORMAL LOW (ref 36.0–46.0)
Hemoglobin: 7.4 g/dL — ABNORMAL LOW (ref 12.0–15.0)
MCH: 25 pg — ABNORMAL LOW (ref 26.0–34.0)
MCHC: 31.2 g/dL (ref 30.0–36.0)
MCV: 80.1 fL (ref 80.0–100.0)
Platelets: 158 10*3/uL (ref 150–400)
RBC: 2.96 MIL/uL — ABNORMAL LOW (ref 3.87–5.11)
RDW: 19.2 % — ABNORMAL HIGH (ref 11.5–15.5)
WBC: 10.7 10*3/uL — ABNORMAL HIGH (ref 4.0–10.5)
nRBC: 0 % (ref 0.0–0.2)

## 2020-02-09 LAB — SURGICAL PATHOLOGY

## 2020-02-09 MED ORDER — SODIUM CHLORIDE 0.9 % IV SOLN
510.0000 mg | Freq: Once | INTRAVENOUS | Status: AC
  Administered 2020-02-09: 510 mg via INTRAVENOUS
  Filled 2020-02-09: qty 17

## 2020-02-09 NOTE — Progress Notes (Addendum)
POSTPARTUM PROGRESS NOTE  Subjective: Michele Carney is a 19 y.o. G1P1001 s/p rLTCS at [redacted]w[redacted]d for NRFA and arrest of dilation.  She reports she doing well. No acute events overnight. She reports some lightheadedness with ambulating. Denies issues with voiding or po intake. Denies nausea or vomiting. She has passed flatus. Pain is well controlled.  Lochia is mild.  Objective: Blood pressure 100/61, pulse 77, temperature 98.5 F (36.9 C), temperature source Oral, resp. rate 18, height 5\' 1"  (1.549 m), weight 102.9 kg, last menstrual period 04/23/2019, SpO2 98 %, unknown if currently breastfeeding.  Physical Exam:  General: alert, cooperative and no distress Chest: no respiratory distress Abdomen: soft, mildly tender near incision Uterine Fundus: firm and at level of umbilicus Extremities: No calf swelling or tenderness  no edema  Recent Labs    02/06/20 1555 02/09/20 0532  HGB 10.5* 7.4*  HCT 34.0* 23.7*    Assessment/Plan: Michele Carney is a 19 y.o. G1P1001 s/p rLTCS at [redacted]w[redacted]d for NRFA and arrest of dilation. POD#1.   Routine Postpartum Care: Doing well, pain well-controlled.  -- Continue routine care, lactation support  -- Contraception: Abstinence till 6-week postpartum visit  -- Feeding: bottle and breast feeding   Acute blood loss anemia: Administer fereheme for decreased Hgb.  Dispo: Plan for discharge likely on POD#2 or 3.  [redacted]w[redacted]d 02/09/2020 8:01 AM  I saw and evaluated the patient. I agree with the findings and the plan of care as documented in the PA student's note.  02/11/2020, MD [facility] Family Medicine Fellow, [facility], [facility]

## 2020-02-09 NOTE — Progress Notes (Signed)
Pt called out to nurses station saying that her chest was hurting her. RN went into room immediately to assess patient. Pt complaining only of incisional pain at this time. Pt also said she felt pressure on her chest "like someone was grabbing it tight". When I asked pt to show me where it was hurting, she pointed to the upper part of her chest and shoulders. Pt also complained of slight dizziness when up and walking this past time only. RN obtained VS which were WNL. Pt said she felt as if the pressure was moving toward the back of her shoulders as well. RN gave patient heat backs and prescribed pain medications per pt preference and an ice pack for incision and helped her back in bed. Pt is otherwise comfortable at this time. RN will continue to monitor patient and urged patient to call out with worsening pain or differences with symptoms.

## 2020-02-10 DIAGNOSIS — D509 Iron deficiency anemia, unspecified: Secondary | ICD-10-CM

## 2020-02-10 MED ORDER — IBUPROFEN 800 MG PO TABS: 800.0000 mg | ORAL_TABLET | Freq: Four times a day (QID) | ORAL | 0 refills | Status: DC

## 2020-02-10 MED ORDER — OXYCODONE HCL 5 MG PO TABS: 5.0000 mg | ORAL_TABLET | ORAL | 0 refills | Status: DC | PRN

## 2020-02-10 MED ORDER — IRON 325 (65 FE) MG PO TABS: 325.0000 mg | ORAL_TABLET | ORAL | 3 refills | Status: DC

## 2020-02-10 MED ORDER — ACETAMINOPHEN 500 MG PO TABS
1000.0000 mg | ORAL_TABLET | Freq: Three times a day (TID) | ORAL | 0 refills | Status: DC
Start: 2020-02-10 — End: 2020-02-10

## 2020-02-10 MED ORDER — ACETAMINOPHEN 500 MG PO TABS
1000.0000 mg | ORAL_TABLET | Freq: Three times a day (TID) | ORAL | 0 refills | Status: DC
Start: 2020-02-10 — End: 2020-11-30

## 2020-02-10 NOTE — Progress Notes (Signed)
Patient states she has decided to only formula feed. Discussed management of non-breastfeeding engorgement. Pt has a manuel pump and has basic knowledge about breastfeeding if she decides to resume breastfeeding.

## 2020-02-13 ENCOUNTER — Inpatient Hospital Stay (HOSPITAL_COMMUNITY): Admission: AD | Admit: 2020-02-13 | Payer: Medicaid Other | Source: Home / Self Care

## 2020-02-17 ENCOUNTER — Ambulatory Visit: Payer: Medicaid Other

## 2020-02-17 ENCOUNTER — Other Ambulatory Visit: Payer: Self-pay

## 2020-02-17 ENCOUNTER — Ambulatory Visit (INDEPENDENT_AMBULATORY_CARE_PROVIDER_SITE_OTHER): Payer: Medicaid Other | Admitting: Obstetrics

## 2020-02-17 ENCOUNTER — Encounter: Payer: Self-pay | Admitting: Obstetrics

## 2020-02-17 DIAGNOSIS — Z3009 Encounter for other general counseling and advice on contraception: Secondary | ICD-10-CM

## 2020-02-17 DIAGNOSIS — D649 Anemia, unspecified: Secondary | ICD-10-CM

## 2020-02-17 LAB — POCT URINALYSIS DIPSTICK
Bilirubin, UA: NEGATIVE
Glucose, UA: NEGATIVE
Ketones, UA: NEGATIVE
Nitrite, UA: NEGATIVE
Protein, UA: POSITIVE — AB
Spec Grav, UA: 1.02 (ref 1.010–1.025)
Urobilinogen, UA: NEGATIVE E.U./dL — AB
pH, UA: 6 (ref 5.0–8.0)

## 2020-02-17 NOTE — Addendum Note (Signed)
Addended by: Marya Landry D on: 02/17/2020 11:56 AM   Modules accepted: Orders

## 2020-02-17 NOTE — Progress Notes (Signed)
PP incision check.

## 2020-02-17 NOTE — Progress Notes (Signed)
Subjective:     Michele Carney is a 19 y.o. female who presents for a postpartum visit. She is 1 week postpartum following a low cervical transverse Cesarean section. I have fully reviewed the prenatal and intrapartum course. The delivery was at 39 gestational weeks. Outcome: primary cesarean section, low transverse incision. Anesthesia: epidural. Postpartum course has been normal. Baby's course has been normal. Baby is feeding by both breast and bottle - Carnation Good Start. Bleeding thin lochia. Bowel function is normal. Bladder function is normal. Patient is not sexually active. Contraception method is abstinence. Postpartum depression screening: negative.  Tobacco, alcohol and substance abuse history reviewed.  Adult immunizations reviewed including TDAP, rubella and varicella.  The following portions of the patient's history were reviewed and updated as appropriate: allergies, current medications, past family history, past medical history, past social history, past surgical history and problem list.  Review of Systems A comprehensive review of systems was negative except for: Gastrointestinal: positive for constipation   Objective:    BP 111/74   Pulse 92   Wt 208 lb (94.3 kg)   LMP 04/23/2019   BMI 39.30 kg/m   General:  alert and no distress   Breasts:  inspection negative, no nipple discharge or bleeding, no masses or nodularity palpable  Lungs: clear to auscultation bilaterally  Heart:  regular rate and rhythm, S1, S2 normal, no murmur, click, rub or gallop  Abdomen: soft, non-tender; bowel sounds normal; no masses,  no organomegaly and incision is clean, dry, intact and non tender   Vulva:  not evaluated  Vagina: not evaluated  Cervix:  not evaluated  Corpus: not examined  Adnexa:  not evaluated  Rectal Exam: Not performed.          50% of 15 min visit spent on counseling and coordination of care.    Assessment:     1. Postpartum care following cesarean  delivery - doing well  2. Postoperative anemia - continue PNV's and iron  3. Encounter for other general counseling and advice on contraception - considering options  Plan:    1. Contraception: abstinence 2. Continue PNV's and Iron 3. Follow up in: 6 weeks or as needed.   Healthy lifestyle practices reviewed  Brock Bad, MD 02/17/2020 10:54 AM

## 2020-02-20 LAB — URINE CULTURE

## 2020-02-29 ENCOUNTER — Ambulatory Visit: Payer: Medicaid Other | Admitting: Obstetrics

## 2020-02-29 ENCOUNTER — Encounter: Payer: Self-pay | Admitting: Obstetrics

## 2020-02-29 ENCOUNTER — Other Ambulatory Visit: Payer: Self-pay

## 2020-02-29 ENCOUNTER — Ambulatory Visit (INDEPENDENT_AMBULATORY_CARE_PROVIDER_SITE_OTHER): Payer: Medicaid Other | Admitting: Obstetrics

## 2020-02-29 DIAGNOSIS — O99893 Other specified diseases and conditions complicating puerperium: Secondary | ICD-10-CM | POA: Diagnosis not present

## 2020-02-29 DIAGNOSIS — Z3009 Encounter for other general counseling and advice on contraception: Secondary | ICD-10-CM | POA: Diagnosis not present

## 2020-02-29 DIAGNOSIS — Z98891 History of uterine scar from previous surgery: Secondary | ICD-10-CM

## 2020-02-29 DIAGNOSIS — R300 Dysuria: Secondary | ICD-10-CM

## 2020-02-29 LAB — POCT URINALYSIS DIPSTICK
Bilirubin, UA: NEGATIVE
Blood, UA: POSITIVE
Glucose, UA: NEGATIVE
Ketones, UA: NEGATIVE
Nitrite, UA: NEGATIVE
Protein, UA: NEGATIVE
Spec Grav, UA: 1.015 (ref 1.010–1.025)
Urobilinogen, UA: 0.2 E.U./dL
pH, UA: 6 (ref 5.0–8.0)

## 2020-02-29 NOTE — Progress Notes (Signed)
..   Post Partum Visit Note  Michele Carney is a 19 y.o. G28P1001 female who presents for a postpartum visit. She is 3 weeks postpartum following a primary cesarean section.  I have fully reviewed the prenatal and intrapartum course. The delivery was at 39.2 gestational weeks.  Anesthesia: epidural. Postpartum course has been complicated by incision pain. Baby is colicky. Baby is feeding by bottle - Gerber Gentle Soothe Pro. Bleeding staining only. Bowel function is abnormal: has to take stool softner and still painful. Bladder function is abnormal: has discomfort and not urinating as much. Patient is not sexually active. Contraception method is none. Postpartum depression screening: positive.   The pregnancy intention screening data noted above was reviewed. Potential methods of contraception were discussed. The patient elected to proceed with No Method - No Contraceptive Precautions.    Edinburgh Postnatal Depression Scale - 02/29/20 1351      Edinburgh Postnatal Depression Scale:  In the Past 7 Days   I have been able to laugh and see the funny side of things. 0    I have looked forward with enjoyment to things. 0    I have blamed myself unnecessarily when things went wrong. 2    I have been anxious or worried for no good reason. 0    I have felt scared or panicky for no good reason. 0    Things have been getting on top of me. 3    I have been so unhappy that I have had difficulty sleeping. 0    I have felt sad or miserable. 3    I have been so unhappy that I have been crying. 0    The thought of harming myself has occurred to me. 0    Edinburgh Postnatal Depression Scale Total 8            The following portions of the patient's history were reviewed and updated as appropriate: allergies, current medications, past family history, past medical history, past social history, past surgical history and problem list.  Review of Systems A comprehensive review of systems was  negative.    Objective:  Blood pressure 124/72, pulse 93, height 5\' 1"  (1.549 m), weight 206 lb 4.8 oz (93.6 kg), last menstrual period 04/23/2019, unknown if currently breastfeeding.  General:  alert and no distress   Breasts:  inspection negative, no nipple discharge or bleeding, no masses or nodularity palpable  Lungs: clear to auscultation bilaterally  Heart:  regular rate and rhythm, S1, S2 normal, no murmur, click, rub or gallop  Abdomen: soft, non-tender; bowel sounds normal; no masses,  no organomegaly and incision is clean, dry and intact   Vulva:  not evaluated  Vagina: not evaluated  Cervix:  not evaluated  Corpus: not examined  Adnexa:  not evaluated  Rectal Exam: Not performed.        Assessment:     Plan:   Essential components of care per ACOG recommendations:  1.  Mood and well being: Patient with negative depression screening today.  - Patient does not use tobacco.  - hx of drug use? No    2. Infant care and feeding:  -Patient currently breastmilk feeding? No                    -Social determinants of health (SDOH) reviewed in EPIC. No concerns  3. Sexuality, contraception and birth spacing - Patient does not want a pregnancy in the next year.  Desired family  size is unknown  - Reviewed forms of contraception in tiered fashion. Patient desired no method today.   - Discussed birth spacing of 18 months  4. Sleep and fatigue -Encouraged family/partner/community support of 4 hrs of uninterrupted sleep to help with mood and fatigue  5. Physical Recovery  - Discussed patients delivery was discussed.  She had a C/S, and there were no complications - Patient has urinary incontinence? No, but she is having difficulty with getting urine out, and only urinates 2-3 times a day.  Urine dip was positive for blood and leukocytes.  Urine culture sent and she was started on an antibiotic - Patient is not safe to resume physical and sexual activity  6.  Health  Maintenance - Last pap smear done n/a and was n/a with negative HPV.  7. No Chronic Disease   Coral Ceo, MD Center for [facility], [facility] 02/29/20

## 2020-03-02 ENCOUNTER — Ambulatory Visit
Admission: EM | Admit: 2020-03-02 | Discharge: 2020-03-02 | Disposition: A | Discharge status: Home / Self Care | Payer: Medicaid Other | Attending: Family Medicine | Admitting: Family Medicine

## 2020-03-02 ENCOUNTER — Encounter: Payer: Self-pay | Admitting: Emergency Medicine

## 2020-03-02 ENCOUNTER — Other Ambulatory Visit: Payer: Self-pay

## 2020-03-02 DIAGNOSIS — H9202 Otalgia, left ear: Secondary | ICD-10-CM | POA: Diagnosis not present

## 2020-03-02 DIAGNOSIS — R0982 Postnasal drip: Secondary | ICD-10-CM

## 2020-03-02 DIAGNOSIS — R0981 Nasal congestion: Secondary | ICD-10-CM | POA: Diagnosis not present

## 2020-03-02 DIAGNOSIS — J029 Acute pharyngitis, unspecified: Secondary | ICD-10-CM

## 2020-03-02 LAB — POCT RAPID STREP A (OFFICE): Rapid Strep A Screen: NEGATIVE

## 2020-03-02 LAB — URINE CULTURE

## 2020-03-02 MED ORDER — FLUTICASONE PROPIONATE 50 MCG/ACT NA SUSP: 2.0000 | Freq: Every day | NASAL | 2 refills | Status: DC

## 2020-03-02 MED ORDER — CETIRIZINE-PSEUDOEPHEDRINE ER 5-120 MG PO TB12: 1.0000 | ORAL_TABLET | Freq: Every day | ORAL | 0 refills | Status: DC

## 2020-03-02 NOTE — ED Triage Notes (Signed)
Pt here for right ear pain x several weeks with nasal congestion

## 2020-03-02 NOTE — Discharge Instructions (Addendum)
I have sent in zyrted D for you to take daily  I have sent in flonase as well for you to use  Your rapid strep test is negative.  A throat culture is pending; we will call you if it is positive requiring treatment.    Follow up with this office or with primary care as needed  Follow up in the ER for high fever, trouble swallowing, trouble breathing, other concerning symptoms

## 2020-03-02 NOTE — ED Provider Notes (Signed)
[facility]   [id number] 03/02/20 Arrival Time: 1100  WN:IOEV THROAT  SUBJECTIVE: History from: patient.  Michele Carney is a 19 y.o. female who presents with abrupt onset of sore throat and left ear pain since this morning. Has not attempted OTC treatment. Denies sick contacts. Has negative history of Covid. Has not completed Covid vaccines. States that she has a newborn at home, and wants to make sure that she does not have anything that she can get her baby. Symptoms are made worse with swallowing, but tolerating liquids and own secretions without difficulty.  Denies previous symptoms in the past.     Denies fever, chills, fatigue, ear pain, sinus pain, rhinorrhea, nasal congestion, cough, SOB, wheezing, chest pain, nausea, rash, changes in bowel or bladder habits.     ROS: As per HPI.  All other pertinent ROS negative.     Past Medical History:  Diagnosis Date  . Medical history non-contributory    Past Surgical History:  Procedure Laterality Date  . CESAREAN SECTION N/A 02/08/2020   Procedure: CESAREAN SECTION;  Surgeon: Lazaro Arms, MD;  Location: [facility] LD [facility]S;  Service: Obstetrics;  Laterality: N/A;  . NO PAST SURGERIES     Allergies  Allergen Reactions  . Shellfish Allergy Rash    shrimp shrimp    No current facility-administered medications on file prior to encounter.   Current Outpatient Medications on File Prior to Encounter  Medication Sig Dispense Refill  . acetaminophen (TYLENOL) 325 MG tablet Take 650 mg by mouth every 6 (six) hours as needed for mild pain or headache.  (Patient not taking: Reported on 02/29/2020)    . acetaminophen (TYLENOL) 500 MG tablet Take 2 tablets (1,000 mg total) by mouth every 8 (eight) hours. (Patient not taking: Reported on 02/29/2020) 30 tablet 0  . Blood Pressure Monitoring (BLOOD PRESSURE MONITOR AUTOMAT) DEVI 1 Device by Does not apply route daily. Automatic blood pressure cuff regular size. To monitor blood  pressure regularly at home. ICD-10 code: O74.90 (Patient not taking: Reported on 02/29/2020) 1 each 0  . cyclobenzaprine (FLEXERIL) 10 MG tablet Take 1 tablet (10 mg total) by mouth every 8 (eight) hours as needed for muscle spasms. (Patient not taking: Reported on 12/14/2019) 30 tablet 1  . docusate sodium (COLACE) 100 MG capsule Take 100 mg by mouth 2 (two) times daily.    . Elastic Bandages & Supports (COMFORT FIT MATERNITY SUPP MED) MISC 1 Device by Does not apply route daily. (Patient not taking: Reported on 01/28/2020) 1 each 0  . Ferrous Sulfate (IRON) 325 (65 Fe) MG TABS Take 1 tablet (325 mg total) by mouth every other day. (Patient not taking: Reported on 02/29/2020) 30 tablet 3  . ferrous sulfate 325 (65 FE) MG tablet Take 1 tablet (325 mg total) by mouth every other day. (Patient not taking: Reported on 01/28/2020) 30 tablet 2  . fluconazole (DIFLUCAN) 150 MG tablet Take one tablet now and 1 tablet in 3 days if symptoms continue. (Patient not taking: Reported on 02/04/2020) 2 tablet 0  . ibuprofen (ADVIL) 800 MG tablet Take 1 tablet (800 mg total) by mouth every 6 (six) hours. (Patient not taking: Reported on 02/29/2020) 30 tablet 0  . ipratropium (ATROVENT) 0.06 % nasal spray Place 2 sprays into both nostrils 4 (four) times daily. (Patient not taking: Reported on 12/21/2019) 15 mL 0  . Misc. Devices (GOJJI WEIGHT SCALE) MISC 1 Device by Does not apply route daily as needed. To weight  self daily as needed at home. ICD-10 code: O71.90 (Patient not taking: Reported on 02/29/2020) 1 each 0  . oxyCODONE (OXY IR/ROXICODONE) 5 MG immediate release tablet Take 1-2 tablets (5-10 mg total) by mouth every 4 (four) hours as needed for moderate pain. (Patient not taking: Reported on 02/29/2020) 10 tablet 0  . Prenatal MV & Min w/FA-DHA (PRENATAL ADULT GUMMY/DHA/FA) 0.4-25 MG CHEW Chew 1 tablet by mouth daily. (Patient not taking: Reported on 02/29/2020) 30 tablet 2   Social History   Socioeconomic History    . Marital status: Single    Spouse name: Not on file  . Number of children: Not on file  . Years of education: Not on file  . Highest education level: High school graduate  Occupational History  . Not on file  Tobacco Use  . Smoking status: Never Smoker  . Smokeless tobacco: Never Used  Vaping Use  . Vaping Use: Never used  Substance and Sexual Activity  . Alcohol use: Not Currently  . Drug use: Not Currently    Types: Marijuana  . Sexual activity: Yes    Comment: on the patch prior  Other Topics Concern  . Not on file  Social History Narrative  . Not on file   Social Determinants of Health   Financial Resource Strain: Low Risk   . Difficulty of Paying Living Expenses: Not hard at all  Food Insecurity: No Food Insecurity  . Worried About Programme researcher, broadcasting/film/video in the Last Year: Never true  . Ran Out of Food in the Last Year: Never true  Transportation Needs: No Transportation Needs  . Lack of Transportation (Medical): No  . Lack of Transportation (Non-Medical): No  Physical Activity:   . Days of Exercise per Week: Not on file  . Minutes of Exercise per Session: Not on file  Stress: No Stress Concern Present  . Feeling of Stress : Not at all  Social Connections:   . Frequency of Communication with Friends and Family: Not on file  . Frequency of Social Gatherings with Friends and Family: Not on file  . Attends Religious Services: Not on file  . Active Member of Clubs or Organizations: Not on file  . Attends Banker Meetings: Not on file  . Marital Status: Not on file  Intimate Partner Violence: Not At Risk  . Fear of Current or Ex-Partner: No  . Emotionally Abused: No  . Physically Abused: No  . Sexually Abused: No   Family History  Problem Relation Age of Onset  . Diabetes Maternal Grandmother   . Cancer Maternal Grandfather        colon    OBJECTIVE:  Vitals:   03/02/20 1113  BP: 122/86  Pulse: 99  Resp: 18  Temp: 98.3 F (36.8 C)   TempSrc: Oral  SpO2: 96%     General appearance: alert; appears fatigued, but nontoxic, speaking in full sentences and managing own secretions HEENT: NCAT; Ears: EACs clear, TMs pearly gray with visible cone of light, without erythema; Eyes: PERRL, EOMI grossly; Nose: no obvious rhinorrhea; Throat: oropharynx erythematous, cobblestoning present, tonsils 2+ and erythematous without white tonsillar exudates, uvula midline Neck: supple with LAD Lungs: CTA bilaterally without adventitious breath sounds; cough absent Heart: regular rate and rhythm.  Radial pulses 2+ symmetrical bilaterally Skin: warm and dry Psychological: alert and cooperative; normal mood and affect  LABS: Results for orders placed or performed during the hospital encounter of 03/02/20 (from the past 24 hour(s))  POCT  rapid strep A     Status: None   Collection Time: 03/02/20 11:53 AM  Result Value Ref Range   Rapid Strep A Screen Negative Negative     ASSESSMENT & PLAN:  1. Viral pharyngitis   2. Nasal congestion   3. Left ear pain   4. Post-nasal drip     Meds ordered this encounter  Medications  . cetirizine-pseudoephedrine (ZYRTEC-D) 5-120 MG tablet    Sig: Take 1 tablet by mouth daily.    Dispense:  30 tablet    Refill:  0    Order Specific Question:   Supervising Provider    Answer:   Merrilee Jansky [id number]  . fluticasone (FLONASE) 50 MCG/ACT nasal spray    Sig: Place 2 sprays into both nostrils daily.    Dispense:  9.9 mL    Refill:  2    Order Specific Question:   Supervising Provider    Answer:   Merrilee Jansky [id number]   Strep test negative, will send out for culture and we will call you with results Declines test for mono at this time Get plenty of rest and push fluids Take OTC Zyrtec and use chloraseptic spray as needed for throat pain. Drink warm or cool liquids, use throat lozenges, or popsicles to help alleviate symptoms Take OTC ibuprofen or tylenol as needed for pain Follow up  with PCP if symptoms persists Return or go to ER if patient has any new or worsening symptoms such as fever, chills, nausea, vomiting, worsening sore throat, cough, abdominal pain, chest pain, changes in bowel or bladder habits  Reviewed expectations re: course of current medical issues. Questions answered. Outlined signs and symptoms indicating need for more acute intervention. Patient verbalized understanding. After Visit Summary given.          Moshe Cipro, NP 03/02/20 1204

## 2020-03-07 ENCOUNTER — Other Ambulatory Visit: Payer: Self-pay

## 2020-03-07 ENCOUNTER — Ambulatory Visit (INDEPENDENT_AMBULATORY_CARE_PROVIDER_SITE_OTHER): Payer: Medicaid Other | Admitting: Obstetrics and Gynecology

## 2020-03-07 ENCOUNTER — Encounter: Payer: Self-pay | Admitting: Obstetrics and Gynecology

## 2020-03-07 DIAGNOSIS — F53 Postpartum depression: Secondary | ICD-10-CM | POA: Diagnosis not present

## 2020-03-07 DIAGNOSIS — Z3202 Encounter for pregnancy test, result negative: Secondary | ICD-10-CM

## 2020-03-07 DIAGNOSIS — O99345 Other mental disorders complicating the puerperium: Secondary | ICD-10-CM

## 2020-03-07 DIAGNOSIS — Z30017 Encounter for initial prescription of implantable subdermal contraceptive: Secondary | ICD-10-CM

## 2020-03-07 LAB — POCT URINE PREGNANCY: Preg Test, Ur: NEGATIVE

## 2020-03-07 MED ORDER — ETONOGESTREL 68 MG ~~LOC~~ IMPL
68.0000 mg | DRUG_IMPLANT | Freq: Once | SUBCUTANEOUS | Status: AC
  Administered 2020-03-07: 68 mg via SUBCUTANEOUS

## 2020-03-07 NOTE — Progress Notes (Signed)
..   Post Partum Visit Note  Michele Carney is a 19 y.o. G72P1001 female who presents for a postpartum visit. She is 4 weeks postpartum following a primary cesarean section.  I have fully reviewed the prenatal and intrapartum course. The delivery was at 39.2 gestational weeks.  Anesthesia: epidural. Postpartum course has been uncomplicated. Baby is "colicky". Baby is feeding by bottle - Gerber Gentle. Bleeding staining only. Bowel function is normal. Bladder function is abnormal, reports decreased frequency, burning and cramping with urination. Patient is not sexually active. Contraception method is none. Postpartum depression screening: negative.   The pregnancy intention screening data noted above was reviewed. Potential methods of contraception were discussed. The patient elected to proceed with Hormonal Implant.    Edinburgh Postnatal Depression Scale - 03/07/20 1322      Edinburgh Postnatal Depression Scale:  In the Past 7 Days   I have been able to laugh and see the funny side of things. 0    I have looked forward with enjoyment to things. 0    I have blamed myself unnecessarily when things went wrong. 2    I have been anxious or worried for no good reason. 0    I have felt scared or panicky for no good reason. 0    Things have been getting on top of me. 3    I have been so unhappy that I have had difficulty sleeping. 0    I have felt sad or miserable. 2    I have been so unhappy that I have been crying. 2    The thought of harming myself has occurred to me. 0    Edinburgh Postnatal Depression Scale Total 9               Review of Systems Pertinent items noted in HPI and remainder of comprehensive ROS otherwise negative.    Objective:  Blood pressure 120/80, pulse (!) 105, weight 205 lb (93 kg), last menstrual period 04/23/2019, unknown if currently breastfeeding.  General:  alert, cooperative and no distress   Breasts:  inspection negative, no nipple discharge  or bleeding, no masses or nodularity palpable  Lungs: clear to auscultation bilaterally  Heart:  regular rate and rhythm  Abdomen: soft, non-tender; bowel sounds normal; no masses,  no organomegaly and incision is healing well without erythema, induration or drainage   Vulva:  normal  Vagina: normal vagina, no discharge, exudate, lesion, or erythema  Cervix:  multiparous appearance  Corpus: normal size, contour, position, consistency, mobility, non-tender  Adnexa:  normal adnexa and no mass, fullness, tenderness  Rectal Exam: Not performed.        Assessment:    Normal postpartum exam. Pap smear not done at today's visit.   Plan:   Essential components of care per ACOG recommendations:  1.  Mood and well being: Patient with positive depression screening today. Reviewed local resources for support. Patient admits to being overwhelmed at times and desires to complete her education and work. Patient denies suicidal or homicidal ideation - Patient does not use tobacco.  - hx of drug use? Yes    2. Infant care and feeding:  -Patient currently breastmilk feeding? No  -Social determinants of health (SDOH) reviewed in EPIC. No concerns  3. Sexuality, contraception and birth spacing - Patient does not want a pregnancy in the next year.   - Reviewed forms of contraception in tiered fashion. Patient desired Nexplanon today.   Patient given informed consent, signed  copy in the chart, time out was performed. Pregnancy test was negative. Appropriate time out taken.  Patient's left arm was prepped and draped in the usual sterile fashion. The ruler used to measure and mark insertion area.  Patient was prepped with alcohol swab and then injected with 2 cc of 1% lidocaine with epinephrine.  Patient was prepped with betadine, Nexplanon removed form packaging.  Device confirmed in needle, then inserted full length of needle and withdrawn per handbook instructions.  Patient insertion site covered with band  aid and coband.   Minimal blood loss.  Patient tolerated the procedure well.  - Discussed birth spacing of 18 months  4. Sleep and fatigue -Encouraged family/partner/community support of 4 hrs of uninterrupted sleep to help with mood and fatigue  5. Physical Recovery  - Discussed patients delivery and complications - Patient has urinary incontinence? No - Patient is safe to resume physical and sexual activity  6.  Health Maintenance - Pap smear due 02/11/21   Catalina Antigua, MD Center for [employer], [facility]

## 2020-03-28 ENCOUNTER — Ambulatory Visit: Payer: Medicaid Other | Admitting: Licensed Clinical Social Worker

## 2020-03-28 DIAGNOSIS — Z91199 Patient's noncompliance with other medical treatment and regimen due to unspecified reason: Secondary | ICD-10-CM

## 2020-03-28 DIAGNOSIS — Z5329 Procedure and treatment not carried out because of patient's decision for other reasons: Secondary | ICD-10-CM

## 2020-03-28 NOTE — BH Specialist Note (Signed)
Pt no show appt  mychart text message sent

## 2020-06-15 ENCOUNTER — Ambulatory Visit (INDEPENDENT_AMBULATORY_CARE_PROVIDER_SITE_OTHER): Payer: Medicaid Other | Admitting: Obstetrics and Gynecology

## 2020-06-15 ENCOUNTER — Encounter: Payer: Self-pay | Admitting: Obstetrics and Gynecology

## 2020-06-15 ENCOUNTER — Other Ambulatory Visit: Payer: Self-pay

## 2020-06-15 VITALS — BP 116/73 | HR 82 | Wt 211.0 lb

## 2020-06-15 DIAGNOSIS — N941 Unspecified dyspareunia: Secondary | ICD-10-CM

## 2020-06-15 DIAGNOSIS — R300 Dysuria: Secondary | ICD-10-CM

## 2020-06-15 LAB — POCT URINALYSIS DIPSTICK
Bilirubin, UA: NEGATIVE
Glucose, UA: NEGATIVE
Ketones, UA: NEGATIVE
Leukocytes, UA: NEGATIVE
Nitrite, UA: NEGATIVE
Protein, UA: NEGATIVE
Spec Grav, UA: 1.02 (ref 1.010–1.025)
Urobilinogen, UA: 0.2 E.U./dL
pH, UA: 6.5 (ref 5.0–8.0)

## 2020-06-15 MED ORDER — NORGESTIMATE-ETH ESTRADIOL 0.25-35 MG-MCG PO TABS: 1.0000 | ORAL_TABLET | Freq: Every day | ORAL | 11 refills | Status: DC

## 2020-06-15 NOTE — Progress Notes (Signed)
     GYNECOLOGY OFFICE PROCEDURE NOTE  Michele Carney is a 20 y.o. G1P1001 here for Nexplanon removal.   Nexplanon Removal Patient identified, informed consent performed, consent signed.   Appropriate time out taken. Nexplanon site identified.  Area prepped in usual sterile fashon. One ml of 1% lidocaine was used to anesthetize the area at the distal end of the implant. A small stab incision was made right beside the implant on the distal portion.  The Nexplanon rod was grasped using hemostats and removed without difficulty.  There was minimal blood loss. There were no complications.  3 ml of 1% lidocaine was injected around the incision for post-procedure analgesia.  Steri-strips were applied over the small incision.  A pressure bandage was applied to reduce any bruising.  The patient tolerated the procedure well and was given post procedure instructions.  Patient is planning to use pills for contraception/attempt conception.  Patient requests referral to PT for painful intercourse.    Rasch, Harolyn Rutherford, NP Faculty Practice Center for [employer], [facility]

## 2020-06-15 NOTE — Progress Notes (Signed)
RGYN patient presents for Nexplanon Removal today.  *shellfish allergy *  Inserted: 03/07/2020 Left arm   CC:  HA's, back pain x 2-4 wks, bleeding.  Dysuria pt advised to leave a urine sample Leg pain only in right leg.   Consent signed for Removal.    LMP: 05/03/20-06/03/20

## 2020-06-19 LAB — URINE CULTURE

## 2020-08-08 ENCOUNTER — Encounter: Payer: Medicaid Other | Attending: Obstetrics and Gynecology | Admitting: Physical Therapy

## 2020-08-08 ENCOUNTER — Other Ambulatory Visit: Payer: Self-pay

## 2020-08-08 ENCOUNTER — Encounter: Payer: Self-pay | Admitting: Physical Therapy

## 2020-08-08 DIAGNOSIS — M545 Low back pain, unspecified: Secondary | ICD-10-CM | POA: Diagnosis present

## 2020-08-08 DIAGNOSIS — M6281 Muscle weakness (generalized): Secondary | ICD-10-CM | POA: Insufficient documentation

## 2020-08-08 DIAGNOSIS — R252 Cramp and spasm: Secondary | ICD-10-CM | POA: Insufficient documentation

## 2020-08-08 DIAGNOSIS — L905 Scar conditions and fibrosis of skin: Secondary | ICD-10-CM | POA: Insufficient documentation

## 2020-08-08 DIAGNOSIS — G8929 Other chronic pain: Secondary | ICD-10-CM | POA: Diagnosis present

## 2020-08-08 DIAGNOSIS — R278 Other lack of coordination: Secondary | ICD-10-CM | POA: Diagnosis present

## 2020-08-08 NOTE — Patient Instructions (Addendum)
Lubrication . Used for intercourse to reduce friction . Avoid ones that have glycerin, warming gels, tingling gels, icing or cooling gel, scented . Avoid parabens due to a preservative similar to female sex hormone . May need to be reapplied once or several times during sexual activity . Can be applied to both partners genitals prior to vaginal penetration to minimize friction or irritation . Prevent irritation and mucosal tears that cause post coital pain and increased the risk of vaginal and urinary tract infections . Oil-based lubricants cannot be used with condoms due to breaking them down.  Least likely to irritate vaginal tissue.  . Plant based-lubes are safe . Silicone-based lubrication are thicker and last long and used for post-menopausal women  Vaginal Lubricators Here is a list of some suggested lubricators you can use for intercourse. Use the most hypoallergenic product.  You can place on you or your partner.   Slippery Stuff ( water based)  Sylk or Sliquid Natural H2O ( good  if frequent UTI's)- walmart, amazon  Sliquid organics silk-(aloe and silicone based )  M[employer] (www.blossom-organics.com)- (aloe based )  Coconut oil, olive oil -not good with condoms   PJur Woman Nude- (water based) amazon  Uberlube- ( silicon) Amazon  Aloe Vera- Sprouts has an organic one  Yes lubricant- (water based and has plant oil based similar to silicone) [employer] Platinum-Silicone, Target, [facility]  Olive and Bee intimate cream-  www.oliveandbee.com.au  Pink - WellPoint stuff  Erosense Sync- walmart, Sim Boast Things to avoid in lubricants are glycerin, warming gels, tingling gels, icing or cooling  gels, and scented gels.  Also avoid Vaseline. KY jelly, Replens, and Astroglide kills good bacteria(lactobacilli)  Things to avoid in the vaginal area . Do not use things to irritate the vulvar area . No lotions- see below . No soaps; can use Aveeno or Calendula  cleanser if needed. Must be gentle . No deodorants . No douches . Good to sleep without underwear to let the vaginal area to air out . No scrubbing: spread the lips to let warm water rinse over labias and pat dry  Creams that can be used on the Vulva Area  V [employer], walmart  Vital V Wild Yam Salve  Julva- [employer] Botanical Pro-Meno Wild Yam Cream  Coconut oil, olive oil  Cleo by [employer] labial moisturizer -Amazon,   Desert Nowata Releveum ( lidocaine) or Desert Fiserv .    Creams to use externally on the Vulva area  Marathon Oil (good for for cancer patients that had radiation to the area)- [place] or [employer].https://garcia-[email or web address]/  V-magic cream - amazon  Julva-amazon  Vital "V Wild Yam salve ( help moisturize and help with thinning vulvar area, does have Beeswax  MoodMaid Botanical Pro-Meno Wild Yam Cream- Amazon  Desert Harvest Gele  Cleo by Zane Herald labial moisturizer (Amazon,   Coconut or olive oil  aloe   Things to avoid in the vaginal area . Do not use things to irritate the vulvar area . No lotions just specialized creams for the vulva area- Neogyn, V-magic, No soaps; can use Aveeno or Calendula cleanser if needed. Must be gentle . No deodorants . No douches . Good to sleep without underwear to let the vaginal area to air out . No scrubbing: spread the lips to let warm water rinse over labias and pat dry Eulis Foster, PT [facility] [address], [address]  [place], [place] [address] W: [phone] [email or web address]

## 2020-08-08 NOTE — Therapy (Signed)
[facility] [address], [address] [place], [place], [address] Phone: [phone]   Fax:  [phone]  Physical Therapy Evaluation  Patient Details  Name: Michele Carney MRN: [id number] Date of Birth: 2001-03-10 Referring Provider (PT): Venia Carbon, NP   Encounter Date: 08/08/2020   PT End of Session - 08/08/20 1047    Visit Number 1    Date for PT Re-Evaluation 10/31/20    Authorization Type [employer]    PT Start Time 0840    PT Stop Time 0925    PT Time Calculation (min) 45 min    Activity Tolerance Patient tolerated treatment well    Behavior During Therapy [facility] for tasks assessed/performed           Past Medical History:  Diagnosis Date  . Medical history non-contributory     Past Surgical History:  Procedure Laterality Date  . CESAREAN SECTION N/A 02/08/2020   Procedure: CESAREAN SECTION;  Surgeon: Lazaro Arms, MD;  Location: [facility] LD ORS;  Service: Obstetrics;  Laterality: N/A;  . NO PAST SURGERIES      There were no vitals filed for this visit.    Subjective Assessment - 08/08/20 0846    Subjective Patient reprots pain with sex. She gets random pain in the groin area. Patient works in [employer] and back pain. When squeeze legs together it hurts bad.    Patient Stated Goals reduce pain    Currently in Pain? Yes    Pain Score 5     Pain Location Pelvis    Pain Orientation Right;Left    Pain Descriptors / Indicators Pins and needles;Tightness    Pain Type Acute pain    Pain Onset More than a month ago    Pain Frequency Intermittent    Aggravating Factors  movement, squeeze thighs together, walking, laying down on back    Pain Relieving Factors lay on side;    Multiple Pain Sites Yes    Pain Score 6    Pain Location Vagina    Pain Orientation Mid    Pain Descriptors / Indicators Discomfort    Pain Type Acute pain    Pain Onset More than a month ago    Pain Frequency Intermittent     Aggravating Factors  initial penetration of vagina    Pain Relieving Factors no intercourse    Pain Score 8    Pain Location Back    Pain Orientation Lower    Pain Descriptors / Indicators Sore;Tightness    Pain Type Acute pain    Pain Onset More than a month ago    Pain Frequency Constant    Aggravating Factors  bend head forward, and sitting down    Pain Relieving Factors stretch              [facility] PT Assessment - 08/08/20 0001      Assessment   Medical Diagnosis N94.10 Pain in female genitalia on intercourse    Referring Provider (PT) Venia Carbon, NP    Onset Date/Surgical Date 02/08/20    Prior Therapy none      Precautions   Precautions None      Restrictions   Weight Bearing Restrictions No      Balance Screen   Has the patient fallen in the past 6 months No    Has the patient had a decrease in activity level because of a fear of falling?  No    Is the  patient reluctant to leave their home because of a fear of falling?  No      Prior Function   Level of Independence Independent    Vocation Full time employment    Vocation Requirements clean restrooms-squatting lifting, bending over, walking    Leisure none      Cognition   Overall Cognitive Status Within Functional Limits for tasks assessed      Observation/Other Assessments   Skin Integrity C-section scar is tendern and restricted    Focus on Therapeutic Outcomes (FOTO)  PFIQ-7 48; UIQ-7 14; POPIQ-7 33      Posture/Postural Control   Posture/Postural Control Postural limitations    Postural Limitations Increased lumbar lordosis      ROM / Strength   AROM / PROM / Strength AROM;PROM;Strength      AROM   Lumbar Flexion decreased by 25% in lumbar thoracic area      Strength   Right Hip Flexion 4/5    Right Hip Internal Rotation 4/5    Right Hip ABduction 3/5    Right Hip ADduction 4/5    Left Hip Flexion 4/5    Left Hip Internal Rotation 4/5    Left Hip ABduction 5/5    Left Hip ADduction 5/5       Palpation   Spinal mobility Decreased movement of T9-T12, and L3-L5    SI assessment  left ilium rotated posteriorly    Palpation comment left levator ani externally      Special Tests    Special Tests Sacrolliac Tests      Pelvic Compression   Findings Positive    Side Left    comment pain                      Objective measurements completed on examination: See above findings.     Pelvic Floor Special Questions - 08/08/20 0001    Prior Pregnancies Yes    Number of Pregnancies 1    Number of C-Sections 1    Currently Sexually Active Yes    Is this Painful Yes   initial penetration   Urinary Leakage Yes    Pad use pantyliner    Activities that cause leaking Laughing;Coughing;Sneezing;Intercourse    Urinary urgency Yes    Fecal incontinence No   constipation then diarrhea, BM daily, strain   Fluid intake water, orange juice gingerale    External Perineal Exam tenderenss located on the perineal body, bulbocavernosus    Skin Integrity Intact;Other    Skin Integrity other left upper labia area a white patch; has dryness    External Palpation tendernss located on the perinal tody with tightness;; tendern with $:00 to 7:00 on the vulvar    Pelvic Floor Internal Exam Patient confirmed identification and approves PT to assess pelvic floor and treatment    Exam Type Vaginal    Palpation tenderness located on the levator ani, obturaotr internist, left side of the bladder    Strength weak squeeze, no lift    Tone increased                    PT Education - 08/08/20 1032    Education Details education on vaginal moisturizers and lubricants    Person(s) Educated Patient    Methods Explanation;Handout    Comprehension Verbalized understanding            PT Short Term Goals - 08/08/20 1033      PT SHORT TERM  GOAL #1   Title independent with initial HEP for hip stretches and diaphragmatic breathing    Baseline not educated yet    Time 4    Period  Weeks    Status New    Target Date 09/05/20      PT SHORT TERM GOAL #2   Title educated  on vaginal moisturizers and lubricants to improve pelvic floor health    Baseline not educated yet    Time 4    Period Weeks    Status New    Target Date 09/05/20      PT SHORT TERM GOAL #3   Title educated on c-section scar mobilitzation to reduce restrictions and tenderness    Baseline not educated yet    Time 12    Period Weeks    Status New    Target Date 10/31/20             PT Long Term Goals - 08/08/20 1034      PT LONG TERM GOAL #1   Title independent with advanced HEP for core and pelvic floor strengthening    Baseline not educated yet    Time 12    Period Weeks    Status New    Target Date 10/31/20      PT LONG TERM GOAL #2   Title Patient reports minmal ot no pain with penile penetration vaginally due to improve tissue health and mobility    Baseline pain level is 6/10    Time 12    Period Weeks    Status New    Target Date 10/31/20      PT LONG TERM GOAL #3   Title Patient reports pelvic pain decreased to </= 2/10 du eto pelvic alignment is corrected, improve hip strength >/= 4/5 and improve tissue mobitliy    Baseline pain level is 5/10, left ilium is rotated posteriorly    Time 12    Period Weeks    Status New    Target Date 10/31/20      PT LONG TERM GOAL #4   Title Patient reports urinary leakage with laughing, coughing, and during intercourse has decreased to minimal to none due to pelvic floor strength >/= 3/5    Baseline pelvic floor strength is 2/5    Time 12    Period Weeks    Status New    Target Date 10/31/20      PT LONG TERM GOAL #5   Title Patient reports back pain </= 2/10 due to improve lumbar tissue mobility, increased core strength and hip strength so she is able to perform her work tasks with greater ease.    Baseline pain level 8/10    Time 12    Period Weeks    Status New    Target Date 10/31/20                  Plan -  08/08/20 0925    Clinical Impression Statement Patient is a 20 year old female with pain with intercourse and urinary leakage since she had her child by C-section on 02/08/2020. Patient reports her pelvic pain is 610 with penile penetration vaginally on the initial penetration. Her pelvic pain is 5/10 with staind, laying on her back, walking, standing. Back pain is 8/10 with bending, walking, sitting, stretching and bending her head forward. Patient wears a panty liner due to urinary leakage with laughing, sneezing, during iintercourse. Lumbar flexion is limited by 25% in the thoracic  lumbar area. She has decreased mobility with T9-T12 and L3-L5. Left ilium is rotated posteriorly. Positive SI joint compression test on the left with pain. Weakness in bilateral hips with left worse than right. Pelvic floor strength is 2/5. Palpable tendernss located on the perineal body, bilateral obturator internist, bilateral levator ani, and posterior introitus from 4:00 to 7:00. White patch at the upper left labia and vaginal dryness. Patient c-section scar is tender and decreased mobility.PFIq-7 IS 48 points, UIQ-7 is 14 points, and POP-7 is 33 points. , Patient will benefit from skilledled therapy to improve tissue mobtiliy and coordination while increase strength to improve function.    Personal Factors and Comorbidities Age;Comorbidity 1;Fitness;Time since onset of injury/illness/exacerbation;Profession    Examination-Activity Limitations Continence;Toileting;Sit;Stand;Lift    Examination-Participation Restrictions Interpersonal Relationship;Community Activity;Cleaning;Occupation    Stability/Clinical Decision Making Stable/Uncomplicated    Clinical Decision Making Low    Rehab Potential Excellent    PT Frequency 1x / week    PT Duration 12 weeks    PT Treatment/Interventions Biofeedback;Cryotherapy;Moist Heat;Therapeutic activities;Therapeutic exercise;Neuromuscular re-education;Patient/family education;Manual  techniques;Scar mobilization;Passive range of motion;Dry needling;Spinal Manipulations    PT Next Visit Plan c-section scar mobilization, hip stretches; diaphragmatic breathing, internal manual work and externally, you tube for external pelvic floro work, go over vaginal moisturizers;correct ilium    Consulted and Agree with Plan of Care Patient           Patient will benefit from skilled therapeutic intervention in order to improve the following deficits and impairments:  Decreased endurance,Decreased scar mobility,Pain,Increased fascial restricitons,Decreased activity tolerance,Decreased strength,Decreased mobility,Increased muscle spasms,Decreased range of motion,Decreased coordination  Visit Diagnosis: Muscle weakness (generalized) - Plan: PT plan of care cert/re-cert  Other lack of coordination - Plan: PT plan of care cert/re-cert  Cramp and spasm - Plan: PT plan of care cert/re-cert  Chronic bilateral low back pain without sciatica - Plan: PT plan of care cert/re-cert  Scar - Plan: PT plan of care cert/re-cert     Problem List Patient Active Problem List   Diagnosis Date Noted  . Iron deficiency anemia 02/10/2020  . Cesarean delivery delivered 02/08/2020  . Non-reactive NST (non-stress test) 02/06/2020  . Significant discrepancy between uterine size and clinical dates, antepartum, second trimester 11/16/2019  . Yeast vaginitis 10/14/2019  . GBS (group B streptococcus) UTI complicating pregnancy 08/31/2019  . Supervision of normal first pregnancy, antepartum 07/05/2019    Eulis Foster, PT 08/08/20 10:48 AM   [facility] [address] [place], [place], [address] Phone: [phone]   Fax:  [phone]  Name: Michele Carney MRN: [id number] Date of Birth: 09/11/2000

## 2020-08-15 ENCOUNTER — Encounter: Payer: Self-pay | Admitting: Physical Therapy

## 2020-08-15 ENCOUNTER — Encounter: Payer: Medicaid Other | Admitting: Physical Therapy

## 2020-08-15 ENCOUNTER — Other Ambulatory Visit: Payer: Self-pay

## 2020-08-15 DIAGNOSIS — R278 Other lack of coordination: Secondary | ICD-10-CM

## 2020-08-15 DIAGNOSIS — R252 Cramp and spasm: Secondary | ICD-10-CM

## 2020-08-15 DIAGNOSIS — M6281 Muscle weakness (generalized): Secondary | ICD-10-CM | POA: Diagnosis not present

## 2020-08-15 DIAGNOSIS — G8929 Other chronic pain: Secondary | ICD-10-CM

## 2020-08-15 DIAGNOSIS — L905 Scar conditions and fibrosis of skin: Secondary | ICD-10-CM

## 2020-08-15 NOTE — Patient Instructions (Signed)
Access Code: [email or web address] URL: [email or web address] Date: 08/15/2020 Prepared by: Eulis Foster  Exercises Seated Lower Limb Slump Neural Mobilization - 1 x daily - 7 x weekly - 1 sets - 10 reps Seated Piriformis Stretch with Trunk Bend - 1 x daily - 7 x weekly - 1 sets - 2 reps - 30 sec hold Hooklying Active Hamstring Stretch - 1 x daily - 7 x weekly - 1 sets - 2 reps - 30 sec hold Supine Piriformis Stretch with Leg Straight - 1 x daily - 7 x weekly - 31 sets - 2 reps - 30 sec hold Supine Butterfly Groin Stretch - 1 x daily - 7 x weekly - 1 sets - 1 reps - 1 min hold Cat Cow - 1 x daily - 7 x weekly - 1 sets - 10 reps Child's Pose Stretch - 1 x daily - 7 x weekly - 1 sets - 2 reps - 30 sec hold Eulis Foster, PT [facility] [address] [place], [place] [address] W: [phone] [email or web address]

## 2020-08-15 NOTE — Therapy (Signed)
[facility] at [facility] [address] [place], [place], [address] Phone: [phone]   Fax:  [phone]  Physical Therapy Treatment  Patient Details  Name: Michele Carney MRN: [id number] Date of Birth: 20-Jul-2000 Referring Provider (PT): Venia Carbon, NP   Encounter Date: 08/15/2020   PT End of Session - 08/15/20 0853    Visit Number 2    Date for PT Re-Evaluation 10/31/20    Authorization Type [employer]    Authorization Time Period 07/20/2020-10/21/2020    Authorization - Visit Number 1    Authorization - Number of Visits 12    PT Start Time [id number]    PT Stop Time 0920    PT Time Calculation (min) 42 min    Activity Tolerance Patient tolerated treatment well    Behavior During Therapy [facility] for tasks assessed/performed           Past Medical History:  Diagnosis Date  . Medical history non-contributory     Past Surgical History:  Procedure Laterality Date  . CESAREAN SECTION N/A 02/08/2020   Procedure: CESAREAN SECTION;  Surgeon: Lazaro Arms, MD;  Location: [facility] LD ORS;  Service: Obstetrics;  Laterality: N/A;  . NO PAST SURGERIES      There were no vitals filed for this visit.   Subjective Assessment - 08/15/20 0839    Subjective I feel good from the last visit.    Patient Stated Goals reduce pain    Currently in Pain? Yes    Pain Score 6     Pain Location Pelvis    Pain Orientation Right;Left    Pain Descriptors / Indicators Pins and needles;Tightness    Pain Type Acute pain    Pain Onset More than a month ago    Pain Frequency Intermittent    Aggravating Factors  movement, squeeze thighs together, walking, laying down on back    Pain Relieving Factors lay on side    Pain Score 6    Pain Location Vagina    Pain Orientation Mid    Pain Descriptors / Indicators Discomfort    Pain Type Acute pain    Pain Onset More than a month ago    Pain Frequency Intermittent    Aggravating Factors  initial  penetration of vagina    Pain Relieving Factors no intercourse    Pain Score 8    Pain Location Back    Pain Orientation Lower    Pain Descriptors / Indicators Sore;Tightness    Pain Type Acute pain    Pain Onset More than a month ago    Pain Frequency Constant    Aggravating Factors  bend head forward, sitting down    Pain Relieving Factors stretch                             [facility] Adult PT Treatment/Exercise - 08/15/20 0001      Lumbar Exercises: Stretches   Active Hamstring Stretch Right;Left;1 rep;30 seconds    Lower Trunk Rotation 2 reps;30 seconds    Lower Trunk Rotation Limitations supine, right , left    Quadruped Mid Back Stretch 2 reps;30 seconds    Quadruped Mid Back Stretch Limitations childs pose    Piriformis Stretch Right;Left;1 rep;30 seconds    Piriformis Stretch Limitations sitting    Other Lumbar Stretch Exercise lumbar neural tension stretch on the right in sitting with head forward and straighten the right  leg 10x    Other Lumbar Stretch Exercise butterfly stretch holding for 1 minutes      Lumbar Exercises: Quadruped   Madcat/Old Horse 10 reps    Other Quadruped Lumbar Exercises thread the needle stretch, right , left holding for 30 seconds      Manual Therapy   Manual Therapy Joint mobilization;Muscle Energy Technique;Soft tissue mobilization    Manual therapy comments educated patient on mobilization of her c-section scar    Joint Mobilization coreect the sacrum/ PA and rotational mobilization to T4-L5 and post. rib mobilization    Soft tissue mobilization manual mobilization of c-section scar and the lower abdomen to release the tissue    Muscle Energy Technique correct left ilium                  PT Education - 08/15/20 0920    Education Details Access Code: [email or web address]; education on s-section scar mobilization    Person(s) Educated Patient    Methods Explanation;Demonstration;Verbal cues;Handout    Comprehension Verbalized  understanding            PT Short Term Goals - 08/15/20 0924      PT SHORT TERM GOAL #3   Title educated on c-section scar mobilitzation to reduce restrictions and tenderness    Time 12    Period Weeks    Status Achieved             PT Long Term Goals - 08/08/20 1034      PT LONG TERM GOAL #1   Title independent with advanced HEP for core and pelvic floor strengthening    Baseline not educated yet    Time 12    Period Weeks    Status New    Target Date 10/31/20      PT LONG TERM GOAL #2   Title Patient reports minmal ot no pain with penile penetration vaginally due to improve tissue health and mobility    Baseline pain level is 6/10    Time 12    Period Weeks    Status New    Target Date 10/31/20      PT LONG TERM GOAL #3   Title Patient reports pelvic pain decreased to </= 2/10 du eto pelvic alignment is corrected, improve hip strength >/= 4/5 and improve tissue mobitliy    Baseline pain level is 5/10, left ilium is rotated posteriorly    Time 12    Period Weeks    Status New    Target Date 10/31/20      PT LONG TERM GOAL #4   Title Patient reports urinary leakage with laughing, coughing, and during intercourse has decreased to minimal to none due to pelvic floor strength >/= 3/5    Baseline pelvic floor strength is 2/5    Time 12    Period Weeks    Status New    Target Date 10/31/20      PT LONG TERM GOAL #5   Title Patient reports back pain </= 2/10 due to improve lumbar tissue mobility, increased core strength and hip strength so she is able to perform her work tasks with greater ease.    Baseline pain level 8/10    Time 12    Period Weeks    Status New    Target Date 10/31/20                 Plan - 08/15/20 0254    Clinical Impression Statement Patient thoracic pain was  gone after therapy. Her pelvis was in correct alignment after therapy. Patient understands how to perform her c-section scar mobilization. She had decreased pain in the  c-section scar area after the manual work. Patient understands her stretches to improve her overall mobility. Patient will benefit from skilled therapy to improve tissue mobitliy and coordination while increase strength to improve function.    Personal Factors and Comorbidities Age;Comorbidity 1;Fitness;Time since onset of injury/illness/exacerbation;Profession    Examination-Activity Limitations Continence;Toileting;Sit;Stand;Lift    Examination-Participation Restrictions Interpersonal Relationship;Community Activity;Cleaning;Occupation    Stability/Clinical Decision Making Stable/Uncomplicated    Rehab Potential Excellent    PT Frequency 1x / week    PT Duration 12 weeks    PT Treatment/Interventions Biofeedback;Cryotherapy;Moist Heat;Therapeutic activities;Therapeutic exercise;Neuromuscular re-education;Patient/family education;Manual techniques;Scar mobilization;Passive range of motion;Dry needling;Spinal Manipulations    PT Next Visit Plan c-section scar mobilization, diaphragmatic breathing, internal manual work and externally, you tube for external pelvic floro work, go over vaginal moisturizers;correct ilium; abdominal bracing    PT Home Exercise Plan Access Code: [email or web address]    Recommended Other Services MS signed the inital evaluation    Consulted and Agree with Plan of Care Patient           Patient will benefit from skilled therapeutic intervention in order to improve the following deficits and impairments:  Decreased endurance,Decreased scar mobility,Pain,Increased fascial restricitons,Decreased activity tolerance,Decreased strength,Decreased mobility,Increased muscle spasms,Decreased range of motion,Decreased coordination  Visit Diagnosis: Muscle weakness (generalized)  Other lack of coordination  Cramp and spasm  Chronic bilateral low back pain without sciatica  Scar     Problem List Patient Active Problem List   Diagnosis Date Noted  . Iron deficiency anemia  02/10/2020  . Cesarean delivery delivered 02/08/2020  . Non-reactive NST (non-stress test) 02/06/2020  . Significant discrepancy between uterine size and clinical dates, antepartum, second trimester 11/16/2019  . Yeast vaginitis 10/14/2019  . GBS (group B streptococcus) UTI complicating pregnancy 08/31/2019  . Supervision of normal first pregnancy, antepartum 07/05/2019    Eulis Foster, PT 08/15/20 9:25 AM   [place] [facility] [address], [address] [place], [place], [address] Phone: [phone]   Fax:  [phone]  Name: ANIE JUNIEL MRN: [id number] Date of Birth: December 26, 2000

## 2020-08-22 ENCOUNTER — Encounter: Payer: Medicaid Other | Attending: Obstetrics and Gynecology | Admitting: Physical Therapy

## 2020-08-22 ENCOUNTER — Encounter: Payer: Self-pay | Admitting: Physical Therapy

## 2020-08-22 ENCOUNTER — Other Ambulatory Visit: Payer: Self-pay

## 2020-08-22 DIAGNOSIS — G8929 Other chronic pain: Secondary | ICD-10-CM | POA: Insufficient documentation

## 2020-08-22 DIAGNOSIS — R252 Cramp and spasm: Secondary | ICD-10-CM | POA: Diagnosis present

## 2020-08-22 DIAGNOSIS — M545 Low back pain, unspecified: Secondary | ICD-10-CM | POA: Diagnosis present

## 2020-08-22 DIAGNOSIS — L905 Scar conditions and fibrosis of skin: Secondary | ICD-10-CM | POA: Insufficient documentation

## 2020-08-22 DIAGNOSIS — R278 Other lack of coordination: Secondary | ICD-10-CM | POA: Diagnosis present

## 2020-08-22 DIAGNOSIS — M6281 Muscle weakness (generalized): Secondary | ICD-10-CM | POA: Diagnosis present

## 2020-08-22 NOTE — Therapy (Signed)
[facility] at [facility] [address] [place], [place], [address] Phone: [phone]   Fax:  [phone]  Physical Therapy Treatment  Patient Details  Name: Michele Carney MRN: [id number] Date of Birth: Aug 19, 2000 Referring Provider (PT): Venia Carbon, NP   Encounter Date: 08/22/2020   PT End of Session - 08/22/20 0920    Visit Number 3    Date for PT Re-Evaluation 10/31/20    Authorization Type [employer]    Authorization Time Period 07/20/2020-10/21/2020    Authorization - Visit Number 2    Authorization - Number of Visits 12    PT Start Time 0830    PT Stop Time 0920    PT Time Calculation (min) 50 min    Activity Tolerance Patient tolerated treatment well    Behavior During Therapy [facility] for tasks assessed/performed           Past Medical History:  Diagnosis Date  . Medical history non-contributory     Past Surgical History:  Procedure Laterality Date  . CESAREAN SECTION N/A 02/08/2020   Procedure: CESAREAN SECTION;  Surgeon: Lazaro Arms, MD;  Location: [facility] LD [facility]S;  Service: Obstetrics;  Laterality: N/A;  . NO PAST SURGERIES      There were no vitals filed for this visit.   Subjective Assessment - 08/22/20 0838    Subjective I have been doing the exercises. I feel a new stabbing pain in my groin. Sex is not very painful. The lubricants did not help. Pelvic pain is less constant and decreased in intensity. Upper back pain is gone. The c-section scar is better. I am wearing underwear that goes above the scar that is helping.    Patient Stated Goals reduce pain    Currently in Pain? Yes    Pain Score 4     Pain Location Pelvis    Pain Orientation Right;Left    Pain Descriptors / Indicators Tightness;Pins and needles    Pain Type Acute pain    Pain Onset More than a month ago    Pain Frequency Intermittent    Aggravating Factors  movement, squeeze thighs together, laying down on back    Pain Relieving  Factors lay on side    Multiple Pain Sites Yes    Pain Score 8    Pain Location Vagina    Pain Orientation Mid    Pain Descriptors / Indicators Discomfort;Tender    Pain Type Acute pain    Pain Onset More than a month ago    Pain Frequency Intermittent    Aggravating Factors  initial penetration and deep pentration    Pain Relieving Factors no intercourse    Pain Score 8    Pain Location Back    Pain Orientation Lower    Pain Descriptors / Indicators Tightness;Sore    Pain Type Acute pain    Pain Onset More than a month ago    Pain Frequency Constant    Aggravating Factors  bend head forward, worse after work and the morning afterwards    Pain Relieving Factors stretch              [facility] PT Assessment - 08/22/20 0001      Palpation   SI assessment  left ilium rotated posteriorly                      Pelvic Floor Special Questions - 08/22/20 0001    Pelvic Floor Internal  Exam Patient confirmed identification and approves PT to assess pelvic floor and treatment    Exam Type Vaginal             [facility] Adult PT Treatment/Exercise - 08/22/20 0001      Self-Care   Self-Care Other Self-Care Comments    Other Self-Care Comments  instructed patient to use her bullet to massage the perinal body and posterior vaginal canal      Neuro Re-ed    Neuro Re-ed Details  diaphragmatic breathing with expanding the lower rib cage.      Lumbar Exercises: Stretches   Double Knee to Chest Stretch 1 rep;60 seconds    Double Knee to Chest Stretch Limitations moving hips in circular pattern    Lower Trunk Rotation 2 reps;30 seconds    Lower Trunk Rotation Limitations supine, right , left    Press Ups 5 reps      Lumbar Exercises: Supine   Other Supine Lumbar Exercises supine move knee in and out at different angles to open hips and mobilize the SI joint      Manual Therapy   Manual Therapy Joint mobilization;Muscle Energy Technique;Internal Pelvic Floor;Compression  Bandaging    Joint Mobilization coreect the sacrum/ PA and rotational mobilization to T4-L5    Internal Pelvic Floor Manual work to the perineal body, along the left iliococcygeus, obturator internist with one finger internal and other external, sometimes included hip rotation    Muscle Energy Technique correct left ilium                  PT Education - 08/22/20 0920    Education Details education on using the bullet to massage the perineal body    Person(s) Educated Patient    Methods Explanation    Comprehension Verbalized understanding            PT Short Term Goals - 08/22/20 0924      PT SHORT TERM GOAL #1   Title independent with initial HEP for hip stretches and diaphragmatic breathing    Time 4    Period Weeks    Status On-going      PT SHORT TERM GOAL #2   Title educated  on vaginal moisturizers and lubricants to improve pelvic floor health    Time 4    Period Weeks    Status Achieved      PT SHORT TERM GOAL #3   Title educated on c-section scar mobilitzation to reduce restrictions and tenderness    Time 12    Period Weeks    Status Achieved             PT Long Term Goals - 08/08/20 1034      PT LONG TERM GOAL #1   Title independent with advanced HEP for core and pelvic floor strengthening    Baseline not educated yet    Time 12    Period Weeks    Status New    Target Date 10/31/20      PT LONG TERM GOAL #2   Title Patient reports minmal ot no pain with penile penetration vaginally due to improve tissue health and mobility    Baseline pain level is 6/10    Time 12    Period Weeks    Status New    Target Date 10/31/20      PT LONG TERM GOAL #3   Title Patient reports pelvic pain decreased to </= 2/10 du eto pelvic alignment is corrected, improve  hip strength >/= 4/5 and improve tissue mobitliy    Baseline pain level is 5/10, left ilium is rotated posteriorly    Time 12    Period Weeks    Status New    Target Date 10/31/20      PT  LONG TERM GOAL #4   Title Patient reports urinary leakage with laughing, coughing, and during intercourse has decreased to minimal to none due to pelvic floor strength >/= 3/5    Baseline pelvic floor strength is 2/5    Time 12    Period Weeks    Status New    Target Date 10/31/20      PT LONG TERM GOAL #5   Title Patient reports back pain </= 2/10 due to improve lumbar tissue mobility, increased core strength and hip strength so she is able to perform her work tasks with greater ease.    Baseline pain level 8/10    Time 12    Period Weeks    Status New    Target Date 10/31/20                 Plan - 08/22/20 6144    Clinical Impression Statement Patient ilium is in correct alignment after manual work. Patient pain level decreased to 5/10 compared to 8/10 after therapy. Patient has tightness in the pelvic floro muscles and perineal body. Patient was able to lay with her lumbar area flatter on the mat. Patient continues to issues with constipation. Patient is doing her HEP. Patient will benefi tfrom skilled therapy to improve tissue mobility and coordination while increase strength to improve function.    Personal Factors and Comorbidities Age;Comorbidity 1;Fitness;Time since onset of injury/illness/exacerbation;Profession    Comorbidities c-section scar    Examination-Activity Limitations Continence;Toileting;Sit;Stand;Lift    Examination-Participation Restrictions Interpersonal Relationship;Community Activity;Cleaning;Occupation    Stability/Clinical Decision Making Stable/Uncomplicated    Rehab Potential Excellent    PT Frequency 1x / week    PT Duration 12 weeks    PT Treatment/Interventions Biofeedback;Cryotherapy;Moist Heat;Therapeutic activities;Therapeutic exercise;Neuromuscular re-education;Patient/family education;Manual techniques;Scar mobilization;Passive range of motion;Dry needling;Spinal Manipulations    PT Next Visit Plan c-section scar mobilization, diaphragmatic  breathing, internal manual work and externally, abdominal massage and toileting    PT Home Exercise Plan Access Code: [email or web address]    Consulted and Agree with Plan of Care Patient           Patient will benefit from skilled therapeutic intervention in order to improve the following deficits and impairments:  Decreased endurance,Decreased scar mobility,Pain,Increased fascial restricitons,Decreased activity tolerance,Decreased strength,Decreased mobility,Increased muscle spasms,Decreased range of motion,Decreased coordination  Visit Diagnosis: Muscle weakness (generalized)  Other lack of coordination  Cramp and spasm  Chronic bilateral low back pain without sciatica  Scar     Problem List Patient Active Problem List   Diagnosis Date Noted  . Iron deficiency anemia 02/10/2020  . Cesarean delivery delivered 02/08/2020  . Non-reactive NST (non-stress test) 02/06/2020  . Significant discrepancy between uterine size and clinical dates, antepartum, second trimester 11/16/2019  . Yeast vaginitis 10/14/2019  . GBS (group B streptococcus) UTI complicating pregnancy 08/31/2019  . Supervision of normal first pregnancy, antepartum 07/05/2019    Eulis Foster, PT 08/22/20 9:26 AM   [place] [facility] at [facility] [address], [address] [place], [place], [address] Phone: [phone]   Fax:  [phone]  Name: Michele Carney MRN: [id number] Date of Birth: 08/09/2000

## 2020-08-29 ENCOUNTER — Encounter: Payer: Medicaid Other | Admitting: Physical Therapy

## 2020-08-29 ENCOUNTER — Other Ambulatory Visit: Payer: Self-pay

## 2020-08-29 ENCOUNTER — Encounter: Payer: Self-pay | Admitting: Physical Therapy

## 2020-08-29 DIAGNOSIS — M6281 Muscle weakness (generalized): Secondary | ICD-10-CM

## 2020-08-29 DIAGNOSIS — R252 Cramp and spasm: Secondary | ICD-10-CM

## 2020-08-29 DIAGNOSIS — R278 Other lack of coordination: Secondary | ICD-10-CM

## 2020-08-29 DIAGNOSIS — L905 Scar conditions and fibrosis of skin: Secondary | ICD-10-CM

## 2020-08-29 DIAGNOSIS — G8929 Other chronic pain: Secondary | ICD-10-CM

## 2020-08-29 NOTE — Therapy (Signed)
[facility] [address] [place], [place], [address] Phone: [phone]   Fax:  [phone]  Physical Therapy Treatment  Patient Details  Name: Michele Carney MRN: [id number] Date of Birth: 29-Dec-2000 Referring Provider (PT): Venia Carbon, NP   Encounter Date: 08/29/2020   PT End of Session - 08/29/20 0918    Visit Number 4    Date for PT Re-Evaluation 10/31/20    Authorization Type [employer]    Authorization Time Period 07/20/2020-10/21/2020    Authorization - Visit Number 3    Authorization - Number of Visits 12    PT Start Time 0830    PT Stop Time 0915    PT Time Calculation (min) 45 min    Activity Tolerance Patient tolerated treatment well;No increased pain    Behavior During Therapy WFL for tasks assessed/performed           Past Medical History:  Diagnosis Date  . Medical history non-contributory     Past Surgical History:  Procedure Laterality Date  . CESAREAN SECTION N/A 02/08/2020   Procedure: CESAREAN SECTION;  Surgeon: Lazaro Arms, MD;  Location: [facility] LD [facility]S;  Service: Obstetrics;  Laterality: N/A;  . NO PAST SURGERIES      There were no vitals filed for this visit.   Subjective Assessment - 08/29/20 0841    Subjective The pelvic pain is gone. Back pain is persistent. I used the vibrator prior to sex and it has helped. The initial penetration was painful but not as bad.    Patient Stated Goals reduce pain    Currently in Pain? Yes    Pain Score 4     Pain Location Pelvis    Pain Orientation Right;Left    Pain Descriptors / Indicators Tightness;Pins and needles    Pain Onset More than a month ago    Pain Frequency Intermittent    Aggravating Factors  walking    Pain Relieving Factors just let it move on    Pain Score 4    Pain Location Vagina    Pain Orientation Mid    Pain Descriptors / Indicators Discomfort;Tender    Pain Type Acute pain    Pain Onset More than a month  ago    Aggravating Factors  initial penetration    Pain Relieving Factors no intercourse    Pain Score 8    Pain Location Back    Pain Orientation Lower    Pain Descriptors / Indicators Sore;Tightness    Pain Type Acute pain    Pain Onset More than a month ago    Pain Frequency Constant    Aggravating Factors  bend forward, worse after work, sitting    Pain Relieving Factors stretch              [facility] PT Assessment - 08/29/20 0001      Strength   Right Hip Flexion 5/5    Right Hip Internal Rotation 4/5    Right Hip ABduction 3-/5    Right Hip ADduction 5/5    Left Hip Flexion 5/5    Left Hip Internal Rotation 4/5    Left Hip ABduction 3+/5    Left Hip ADduction 5/5                         [facility] Adult PT Treatment/Exercise - 08/29/20 0001      Lumbar Exercises: Stretches   Active Hamstring  Stretch Right;Left;1 rep;30 seconds    Active Hamstring Stretch Limitations standing    Other Lumbar Stretch Exercise standing hip adductor stretch hold 30 sec 4 times each side      Lumbar Exercises: Standing   Other Standing Lumbar Exercises standing with side against the wall, right leg against the wall pressing in with knee and hip flexion, right arm at 90 degrees flexion moving the left arm back and forth and therapist keeping trunk in alignment.      Knee/Hip Exercises: Sidelying   Clams right hip reverse clams    Other Sidelying Knee/Hip Exercises ioliacus pullback on the right with feet pressing into the yoga block and tactile cues to move correctly    Other Sidelying Knee/Hip Exercises sidely with right hip ER and right fooot on mat moving knee in and out      Manual Therapy   Manual Therapy Myofascial release    Myofascial Release hookly with cervical flexion and rolled towel under the sacrum performing myofascial release to the urogenital triangle                    PT Short Term Goals - 08/29/20 0177      PT SHORT TERM GOAL #1   Title  independent with initial HEP for hip stretches and diaphragmatic breathing    Time 4    Period Weeks    Status Achieved      PT SHORT TERM GOAL #2   Title educated  on vaginal moisturizers and lubricants to improve pelvic floor health    Time 4    Period Weeks    Status Achieved      PT SHORT TERM GOAL #3   Title educated on c-section scar mobilitzation to reduce restrictions and tenderness    Time 12    Period Weeks    Status Achieved             PT Long Term Goals - 08/29/20 9390      PT LONG TERM GOAL #1   Title independent with advanced HEP for core and pelvic floor strengthening    Time 12    Period Weeks    Status On-going      PT LONG TERM GOAL #2   Title Patient reports minmal ot no pain with penile penetration vaginally due to improve tissue health and mobility    Baseline just initial penetration now    Time 12    Period Weeks    Status On-going      PT LONG TERM GOAL #3   Title Patient reports pelvic pain decreased to </= 2/10 du eto pelvic alignment is corrected, improve hip strength >/= 4/5 and improve tissue mobitliy    Baseline pain level is 5/10, left ilium is rotated posteriorly    Time 12    Period Weeks    Status On-going      PT LONG TERM GOAL #4   Title Patient reports urinary leakage with laughing, coughing, and during intercourse has decreased to minimal to none due to pelvic floor strength >/= 3/5    Baseline pelvic floor strength is 2/5    Time 12    Period Weeks    Status On-going      PT LONG TERM GOAL #5   Title Patient reports back pain </= 2/10 due to improve lumbar tissue mobility, increased core strength and hip strength so she is able to perform her work tasks with greater ease.  Baseline pain level 8/10    Time 12    Period Weeks    Status On-going                 Plan - 08/29/20 0919    Clinical Impression Statement Patient is not having the pain in her pelvis when she squeezes her muscles. Patitent is not having  deep pain in the pelvis with with intercourse just at the entrance. When she uses the vibrator at the area she will have less pain. Patient has increased bilateral hip strength. Patient has fascial tightness from the cervical tot the sacrum and after fascial release did not feel the pulling in her sacrum as she flexes her head forward. AFter therapy her back pain was a 4/10 instead of 8/10. Patient will benefit from skilled therapy to improve tissue mobility and coordination while increases strength to improve function.    Personal Factors and Comorbidities Age;Comorbidity 1;Fitness;Time since onset of injury/illness/exacerbation;Profession    Comorbidities c-section scar    Examination-Activity Limitations Continence;Toileting;Sit;Stand;Lift    Examination-Participation Restrictions Interpersonal Relationship;Community Activity;Cleaning;Occupation    Stability/Clinical Decision Making Stable/Uncomplicated    Rehab Potential Excellent    PT Frequency 1x / week    PT Duration 12 weeks    PT Treatment/Interventions Biofeedback;Cryotherapy;Moist Heat;Therapeutic activities;Therapeutic exercise;Neuromuscular re-education;Patient/family education;Manual techniques;Scar mobilization;Passive range of motion;Dry needling;Spinal Manipulations    PT Next Visit Plan go voer toileting, c-scetion scar mobilization with trunk extension and rotation, manual work to the perineal body, lumbar strengthening; ask about urinary leakage    PT Home Exercise Plan Access Code: [email or web address]    Consulted and Agree with Plan of Care Patient           Patient will benefit from skilled therapeutic intervention in order to improve the following deficits and impairments:  Decreased endurance,Decreased scar mobility,Pain,Increased fascial restricitons,Decreased activity tolerance,Decreased strength,Decreased mobility,Increased muscle spasms,Decreased range of motion,Decreased coordination  Visit Diagnosis: Muscle weakness  (generalized)  Other lack of coordination  Cramp and spasm  Chronic bilateral low back pain without sciatica  Scar     Problem List Patient Active Problem List   Diagnosis Date Noted  . Iron deficiency anemia 02/10/2020  . Cesarean delivery delivered 02/08/2020  . Non-reactive NST (non-stress test) 02/06/2020  . Significant discrepancy between uterine size and clinical dates, antepartum, second trimester 11/16/2019  . Yeast vaginitis 10/14/2019  . GBS (group B streptococcus) UTI complicating pregnancy 08/31/2019  . Supervision of normal first pregnancy, antepartum 07/05/2019    Eulis Foster, PT 08/29/20 9:25 AM   [facility] [address] [place], [place], [address] Phone: [phone]   Fax:  [phone]  Name: KENSLEY VALLADARES MRN: [id number] Date of Birth: 09-Sep-2000

## 2020-09-06 ENCOUNTER — Ambulatory Visit: Payer: Medicaid Other | Admitting: Physical Therapy

## 2020-09-13 ENCOUNTER — Encounter: Payer: Self-pay | Admitting: Physical Therapy

## 2020-09-13 ENCOUNTER — Other Ambulatory Visit: Payer: Self-pay

## 2020-09-13 ENCOUNTER — Ambulatory Visit: Payer: Medicaid Other | Attending: Obstetrics and Gynecology | Admitting: Physical Therapy

## 2020-09-13 DIAGNOSIS — R252 Cramp and spasm: Secondary | ICD-10-CM | POA: Diagnosis present

## 2020-09-13 DIAGNOSIS — G8929 Other chronic pain: Secondary | ICD-10-CM | POA: Insufficient documentation

## 2020-09-13 DIAGNOSIS — R278 Other lack of coordination: Secondary | ICD-10-CM | POA: Insufficient documentation

## 2020-09-13 DIAGNOSIS — M6281 Muscle weakness (generalized): Secondary | ICD-10-CM | POA: Insufficient documentation

## 2020-09-13 DIAGNOSIS — M545 Low back pain, unspecified: Secondary | ICD-10-CM | POA: Insufficient documentation

## 2020-09-13 DIAGNOSIS — L905 Scar conditions and fibrosis of skin: Secondary | ICD-10-CM | POA: Insufficient documentation

## 2020-09-13 NOTE — Therapy (Addendum)
[facility] [address] [place], [place], [address] Phone: [phone]   Fax:  [phone]  Physical Therapy Treatment  Patient Details  Name: DANIAL SISLEY MRN: [id number] Date of Birth: 2001-04-06 Referring Provider (PT): Noni Saupe, NP   Encounter Date: 09/13/2020   PT End of Session - 09/13/20 1624     Visit Number 5    Date for PT Re-Evaluation 10/31/20    Authorization Type [employer]    Authorization Time Period 07/20/2020-10/21/2020    Authorization - Visit Number 4    Authorization - Number of Visits 12    PT Start Time 5003    PT Stop Time 1655    PT Time Calculation (min) 40 min    Activity Tolerance Patient tolerated treatment well;No increased pain    Behavior During Therapy WFL for tasks assessed/performed             Past Medical History:  Diagnosis Date   Medical history non-contributory     Past Surgical History:  Procedure Laterality Date   CESAREAN SECTION N/A 02/08/2020   Procedure: CESAREAN SECTION;  Surgeon: Florian Buff, MD;  Location: [facility] LD ORS;  Service: Obstetrics;  Laterality: N/A;   NO PAST SURGERIES      There were no vitals filed for this visit.   Subjective Assessment - 09/13/20 1616     Subjective I don't feel the same pain with thigh squeezing and only have pain with initial penetration the deep pain has improved. Patient reports improvement with uss of vibrator to assist in pain management. Patiet reportes overall progress. Patient reports using Lucretia Field which also helps. Back pain as improved with stretches provided. Patient reports stuggling with constipation and various stool consistency and timing.    Patient Stated Goals reduce pain    Currently in Pain? Yes    Pain Score 6     Pain Location Back    Pain Orientation Lower    Pain Descriptors / Indicators Aching    Pain Type Acute pain    Aggravating Factors  walking and while at work    Pain  Relieving Factors stretching in AM prior to work                            Pelvic Floor Special Questions - 09/13/20 0001     Prior Pregnancies Yes    Number of Pregnancies 1    Number of C-Sections 1    Currently Sexually Active Yes    Is this Painful Yes   initial penetration   Urinary Leakage Yes    Pad use no longer requires pantyliner    Activities that cause leaking Laughing;Coughing;Sneezing;Intercourse    Urinary urgency Yes    Fecal incontinence No   constipation then diarrhea, BM daily, strain   Fluid intake --    External Perineal Exam tenderenss located on the perineal body, bulbocavernosus    Pelvic Floor Internal Exam Patient confirmed identification and approves PT to assess pelvic floor and treatment    Exam Type Vaginal    Palpation tenderness at Lt side of bladder with "stretching" noted by patient; tightness at the perineal body, distal portion of the ischiococcygeus    Strength good squeeze, good lift, able to hold agaisnt strong resistance    Tone increased               [facility] Adult PT Treatment/Exercise -  09/13/20 0001       Self-Care   Self-Care Other Self-Care Comments    Other Self-Care Comments  instructed patient to on urge drills to decrase urinary urgency, handout given      Manual Therapy   Manual Therapy Soft tissue mobilization;Myofascial release    Soft tissue mobilization manual mobilization of lower abdomen with internal pelvic floor work    Myofascial Release hookly with trigger point release lateral to bladder bilaterally with one hand on the abdomen internally    Internal Pelvic Floor manual work to perineal body with stretching provided, also at Left iliococcygeus, release of the sides of the baldder                    PT Education - 09/13/20 1659     Education Details urge to void    Person(s) Educated Patient    Methods Explanation;Handout    Comprehension Verbalized understanding               PT Short Term Goals - 08/29/20 0923       PT SHORT TERM GOAL #1   Title independent with initial HEP for hip stretches and diaphragmatic breathing    Time 4    Period Weeks    Status Achieved      PT SHORT TERM GOAL #2   Title educated  on vaginal moisturizers and lubricants to improve pelvic floor health    Time 4    Period Weeks    Status Achieved      PT SHORT TERM GOAL #3   Title educated on c-section scar mobilitzation to reduce restrictions and tenderness    Time 12    Period Weeks    Status Achieved               PT Long Term Goals - 09/13/20 1625       PT LONG TERM GOAL #1   Title independent with advanced HEP for core and pelvic floor strengthening    Baseline not educated yet    Time 12    Period Weeks    Status On-going      PT LONG TERM GOAL #2   Title Patient reports minmal ot no pain with penile penetration vaginally due to improve tissue health and mobility    Baseline just initial penetration now    Time 12    Period Weeks    Status On-going      PT LONG TERM GOAL #3   Title Patient reports pelvic pain decreased to </= 2/10 du eto pelvic alignment is corrected, improve hip strength >/= 4/5 and improve tissue mobitliy    Baseline pain level is 5/10, left ilium is rotated posteriorly    Time 12    Period Weeks    Status On-going                   Plan - 09/13/20 1623     Clinical Impression Statement Patient has no pain with deeper penile penetration just initial. Patient continues to have difficulty with having bowel movements. Patient does not leak with intercourse. Patient reports less back pain and is at level 6/10. Patient does not have pelvic pain when she squeezes her thighs together. Pelvic floor strength is 4/5 and is circular. Patient will benefit from skilled therapy to improve tissue mobiltiy and coordinaiton while increases strength to improve function.    Personal Factors and Comorbidities Age;Comorbidity  1;Fitness;Time since onset of injury/illness/exacerbation;Profession  Comorbidities c-section scar    Examination-Activity Limitations Continence;Toileting;Sit;Stand;Lift    Examination-Participation Restrictions Interpersonal Relationship;Community Activity;Cleaning;Occupation    Stability/Clinical Decision Making Stable/Uncomplicated    Rehab Potential Excellent    PT Frequency 1x / week    PT Duration 12 weeks    PT Treatment/Interventions Biofeedback;Cryotherapy;Moist Heat;Therapeutic activities;Therapeutic exercise;Neuromuscular re-education;Patient/family education;Manual techniques;Scar mobilization;Passive range of motion;Dry needling;Spinal Manipulations    PT Next Visit Plan go over toileting, , manual work to the perineal body, lumbar strengthening; ask about the urge to void    PT Home Exercise Plan Access Code: [email or web address]    Consulted and Agree with Plan of Care Patient             Patient will benefit from skilled therapeutic intervention in order to improve the following deficits and impairments:  Decreased endurance,Decreased scar mobility,Pain,Increased fascial restricitons,Decreased activity tolerance,Decreased strength,Decreased mobility,Increased muscle spasms,Decreased range of motion,Decreased coordination  Visit Diagnosis: Muscle weakness (generalized)  Other lack of coordination  Cramp and spasm  Chronic bilateral low back pain without sciatica  Scar     Problem List Patient Active Problem List   Diagnosis Date Noted   Iron deficiency anemia 02/10/2020   Cesarean delivery delivered 02/08/2020   Non-reactive NST (non-stress test) 02/06/2020   Significant discrepancy between uterine size and clinical dates, antepartum, second trimester 11/16/2019   Yeast vaginitis 10/14/2019   GBS (group B streptococcus) UTI complicating pregnancy 88/33/7445   Supervision of normal first pregnancy, antepartum 07/05/2019    Earlie Counts, PT 09/13/20 5:04  PM   [facility] [address] [place], [place], [address] Phone: [phone]   Fax:  [phone]  Name: YECENIA DALGLEISH MRN: [id number] Date of Birth: [birth year]  PHYSICAL THERAPY DISCHARGE SUMMARY  Visits from Start of Care: 5  Current functional level related to goals / functional outcomes: See above. Patient cancelled her appointments to work on her work schedule but has not called to schedule further visits.    Remaining deficits: See above.    Education / Equipment: HEP  Plan: Patient agrees to discharge.  Patient goals were not met. Patient is being discharged due to not scheduling further visits after she has cancelled. Thank you for the referral. Earlie Counts, PT 10/27/20 8:47 AM .

## 2020-10-05 ENCOUNTER — Encounter: Payer: Medicaid Other | Admitting: Physical Therapy

## 2020-10-12 ENCOUNTER — Encounter: Payer: Medicaid Other | Admitting: Physical Therapy

## 2020-10-19 ENCOUNTER — Ambulatory Visit: Payer: Medicaid Other | Admitting: Physical Therapy

## 2020-10-26 ENCOUNTER — Encounter: Payer: Medicaid Other | Admitting: Physical Therapy

## 2020-11-30 ENCOUNTER — Other Ambulatory Visit: Payer: Self-pay

## 2020-11-30 ENCOUNTER — Encounter: Payer: Self-pay | Admitting: Family Medicine

## 2020-11-30 ENCOUNTER — Ambulatory Visit (INDEPENDENT_AMBULATORY_CARE_PROVIDER_SITE_OTHER): Payer: Medicaid Other

## 2020-11-30 ENCOUNTER — Ambulatory Visit (INDEPENDENT_AMBULATORY_CARE_PROVIDER_SITE_OTHER): Payer: Medicaid Other | Admitting: Family Medicine

## 2020-11-30 VITALS — BP 124/75 | HR 97 | Temp 97.6°F | Ht 62.4 in | Wt 211.2 lb

## 2020-11-30 DIAGNOSIS — G8929 Other chronic pain: Secondary | ICD-10-CM

## 2020-11-30 DIAGNOSIS — M545 Low back pain, unspecified: Secondary | ICD-10-CM

## 2020-11-30 DIAGNOSIS — F411 Generalized anxiety disorder: Secondary | ICD-10-CM

## 2020-11-30 DIAGNOSIS — F321 Major depressive disorder, single episode, moderate: Secondary | ICD-10-CM | POA: Diagnosis not present

## 2020-11-30 NOTE — Assessment & Plan Note (Signed)
She declines to start medication at this time.  We discussed community resources that are available.  She denies any active SI at this time or plan.  I have placed a referral for psychology to get her set up with counseling.

## 2020-11-30 NOTE — Patient Instructions (Signed)
Very nice to meet you today! We'll be in touch with labs and xrays I have placed a referral to a therapist for you.

## 2020-11-30 NOTE — Progress Notes (Signed)
Michele Carney - 20 y.o. female MRN [id number]  Date of birth: Aug 25, 2000  Subjective Chief Complaint  Patient presents with   Establish Care   Back Pain   Chest Pain    HPI Michele Carney is a 20 year old female here today for initial visit to establish care.  She has a chronic history of low back pain that has worsened since her pregnancy last year.  She does also have a history of depression and anxiety as well.  Back pain located along the lower spine with some radiation into the back of the upper leg.  She denies any numbness, tingling or weakness in the legs.  She has had some pelvic pain as well and has been seeing pelvic PT.  She has not had any dedicated back physical therapy.  She does also have some pain that is intermittent in that radiates from around her shoulder blades into the front of her chest.  She does get a sensation of shortness of breath when she has this flareup.  She does endorse some generalized arthralgias at times.  She denies any joint swelling, fever, chills.  She is not currently receiving any care for her depression or anxiety.  She would like to try avoiding medication for management of this but would like to be referred to a therapist.  Depression screen [facility] 2/9 11/30/2020 07/21/2019  Decreased Interest 2 3  Down, Depressed, Hopeless 2 3  PHQ - 2 Score 4 6  Altered sleeping 3 2  Tired, decreased energy 2 3  Change in appetite 2 2  Feeling bad or failure about yourself  1 1  Trouble concentrating 3 0  Moving slowly or fidgety/restless 2 1  Suicidal thoughts 3 0  PHQ-9 Score 20 15  Difficult doing work/chores Very difficult Not difficult at all    GAD 7 : Generalized Anxiety Score 11/30/2020 07/21/2019  Nervous, Anxious, on Edge 3 3  Control/stop worrying 2 2  Worry too much - different things 3 2  Trouble relaxing 3 2  Restless 2 2  Easily annoyed or irritable 2 3  Afraid - awful might happen 3 3  Total GAD 7 Score 18 17  Anxiety  Difficulty Very difficult Not difficult at all    ROS:  A comprehensive ROS was completed and negative except as noted per HPI    Allergies  Allergen Reactions   Shellfish Allergy Rash         Past Medical History:  Diagnosis Date   Anxiety    Depression     Past Surgical History:  Procedure Laterality Date   CESAREAN SECTION N/A 02/08/2020   Procedure: CESAREAN SECTION;  Surgeon: Florian Buff, MD;  Location: [facility] LD [facility]S;  Service: Obstetrics;  Laterality: N/A;    Social History   Socioeconomic History   Marital status: Significant Other    Spouse name: Not on file   Number of children: Not on file   Years of education: Not on file   Highest education level: High school graduate  Occupational History   Not on file  Tobacco Use   Smoking status: Never    Passive exposure: Never   Smokeless tobacco: Never  Vaping Use   Vaping Use: Never used  Substance and Sexual Activity   Alcohol use: Not Currently    Alcohol/week: 0.0 - 1.0 standard drinks   Drug use: Yes    Types: Marijuana    Comment: occasionally   Sexual activity: Yes  Comment: on the patch prior  Other Topics Concern   Not on file  Social History Narrative   Not on file   Social Determinants of Health   Financial Resource Strain: Not on file  Food Insecurity: Not on file  Transportation Needs: Not on file  Physical Activity: Not on file  Stress: Not on file  Social Connections: Not on file    Family History  Problem Relation Age of Onset   Diabetes Maternal Grandmother    Cancer Maternal Grandfather        colon   Colon cancer Maternal Grandfather     Health Maintenance  Topic Date Due   COVID-19 Vaccine (1) Never done   INFLUENZA VACCINE  12/18/2020   CHLAMYDIA SCREENING  01/17/2021   TETANUS/TDAP  11/15/2029   Pneumococcal Vaccine 83-11 Years old  Completed   HPV VACCINES  Completed   Hepatitis C Screening  Completed   HIV Screening  Completed      ----------------------------------------------------------------------------------------------------------------------------------------------------------------------------------------------------------------- Physical Exam BP 124/75 (BP Location: Left Arm, Patient Position: Sitting, Cuff Size: Large)   Pulse 97   Temp 97.6 F (36.4 C)   Ht 5' 2.4" (1.585 m)   Wt 211 lb 3.2 oz (95.8 kg)   LMP 11/11/2020 (Exact Date)   SpO2 99%   Breastfeeding No   BMI 38.13 kg/m   Physical Exam Constitutional:      Appearance: She is well-developed.  HENT:     Head: Normocephalic and atraumatic.  Eyes:     General: No scleral icterus. Cardiovascular:     Rate and Rhythm: Normal rate and regular rhythm.  Pulmonary:     Effort: Pulmonary effort is normal.     Breath sounds: Normal breath sounds.  Musculoskeletal:     Cervical back: Neck supple.     Comments: Tenderness along the right SI area as well as mild tenderness along the left SI.  This extends into the lower lumbar spine area as well.  Range of motion is fairly good however she does have pain with flexion and sidebending towards the right.  Neurological:     General: No focal deficit present.     Mental Status: She is alert.  Psychiatric:        Mood and Affect: Mood normal.        Behavior: Behavior normal.    ------------------------------------------------------------------------------------------------------------------------------------------------------------------------------------------------------------------- Assessment and Plan  MDD (major depressive disorder) She declines to start medication at this time.  We discussed community resources that are available.  She denies any active SI at this time or plan.  I have placed a referral for psychology to get her set up with counseling.  Chronic bilateral low back pain without sciatica She is having some chronic low back pain.  She has never had imaging of her lumbar  spine.  Plain x-rays of her lumbar spine ordered today.  Also going to check an ESR and HLA-B27.  We discussed that addition of something like Cymbalta may be helpful as she does have some concurrent depression and this may be helpful for management of her chronic back pain.   No orders of the defined types were placed in this encounter.   No follow-ups on file.    This visit occurred during the SARS-CoV-2 public health emergency.  Safety protocols were in place, including screening questions prior to the visit, additional usage of staff PPE, and extensive cleaning of exam room while observing appropriate contact time as indicated for disinfecting solutions.

## 2020-11-30 NOTE — Assessment & Plan Note (Addendum)
She is having some chronic low back pain.  She has never had imaging of her lumbar spine.  Plain x-rays of her lumbar spine ordered today.  Also going to check an ESR and HLA-B27.  We discussed that addition of something like Cymbalta may be helpful as she does have some concurrent depression and this may be helpful for management of her chronic back pain.

## 2020-12-01 LAB — COMPLETE METABOLIC PANEL WITH GFR
AG Ratio: 1.3 (calc) (ref 1.0–2.5)
ALT: 10 U/L (ref 6–29)
AST: 11 U/L (ref 10–30)
Albumin: 3.9 g/dL (ref 3.6–5.1)
Alkaline phosphatase (APISO): 58 U/L (ref 31–125)
BUN/Creatinine Ratio: 19 (calc) (ref 6–22)
BUN: 9 mg/dL (ref 7–25)
CO2: 23 mmol/L (ref 20–32)
Calcium: 9.1 mg/dL (ref 8.6–10.2)
Chloride: 106 mmol/L (ref 98–110)
Creat: 0.47 mg/dL — ABNORMAL LOW (ref 0.50–0.96)
Globulin: 2.9 g/dL (calc) (ref 1.9–3.7)
Glucose, Bld: 88 mg/dL (ref 65–99)
Potassium: 4.4 mmol/L (ref 3.5–5.3)
Sodium: 138 mmol/L (ref 135–146)
Total Bilirubin: 0.3 mg/dL (ref 0.2–1.2)
Total Protein: 6.8 g/dL (ref 6.1–8.1)
eGFR: 140 mL/min/{1.73_m2} (ref >=60)

## 2020-12-01 LAB — CBC WITH DIFFERENTIAL/PLATELET
Absolute Monocytes: 505 cells/uL (ref 200–950)
Basophils Absolute: 30 cells/uL (ref 0–200)
Basophils Relative: 0.3 %
Eosinophils Absolute: 69 cells/uL (ref 15–500)
Eosinophils Relative: 0.7 %
HCT: 39.5 % (ref 35.0–45.0)
Hemoglobin: 12.7 g/dL (ref 11.7–15.5)
Lymphs Abs: 3564 cells/uL (ref 850–3900)
MCH: 26 pg — ABNORMAL LOW (ref 27.0–33.0)
MCHC: 32.2 g/dL (ref 32.0–36.0)
MCV: 80.8 fL (ref 80.0–100.0)
MPV: 11.2 fL (ref 7.5–12.5)
Monocytes Relative: 5.1 %
Neutro Abs: 5732 cells/uL (ref 1500–7800)
Neutrophils Relative %: 57.9 %
Platelets: 269 10*3/uL (ref 140–400)
RBC: 4.89 10*6/uL (ref 3.80–5.10)
RDW: 14.9 % (ref 11.0–15.0)
Total Lymphocyte: 36 %
WBC: 9.9 10*3/uL (ref 3.8–10.8)

## 2020-12-01 LAB — HLA-B27 ANTIGEN: HLA-B27 Antigen: NEGATIVE

## 2020-12-01 LAB — SEDIMENTATION RATE: Sed Rate: 9 mm/h (ref 0–20)

## 2020-12-07 ENCOUNTER — Encounter (HOSPITAL_COMMUNITY): Payer: Self-pay | Admitting: *Deleted

## 2020-12-07 ENCOUNTER — Inpatient Hospital Stay (HOSPITAL_COMMUNITY)
Admission: AD | Admit: 2020-12-07 | Discharge: 2020-12-07 | Disposition: A | Discharge status: Home / Self Care | Payer: Medicaid Other | Attending: Obstetrics and Gynecology | Admitting: Obstetrics and Gynecology

## 2020-12-07 DIAGNOSIS — Z3202 Encounter for pregnancy test, result negative: Secondary | ICD-10-CM | POA: Insufficient documentation

## 2020-12-07 DIAGNOSIS — R109 Unspecified abdominal pain: Secondary | ICD-10-CM | POA: Diagnosis not present

## 2020-12-07 DIAGNOSIS — N939 Abnormal uterine and vaginal bleeding, unspecified: Secondary | ICD-10-CM | POA: Insufficient documentation

## 2020-12-07 LAB — URINALYSIS, ROUTINE W REFLEX MICROSCOPIC
Bilirubin Urine: NEGATIVE
Glucose, UA: NEGATIVE mg/dL
Ketones, ur: NEGATIVE mg/dL
Leukocytes,Ua: NEGATIVE
Nitrite: NEGATIVE
Protein, ur: 30 mg/dL — AB
RBC / HPF: >50 RBC/hpf — ABNORMAL HIGH (ref 0–5)
Specific Gravity, Urine: 1.02 (ref 1.005–1.030)
pH: 7 (ref 5.0–8.0)

## 2020-12-07 LAB — POCT PREGNANCY, URINE: Preg Test, Ur: NEGATIVE

## 2020-12-07 LAB — HCG, QUANTITATIVE, PREGNANCY: hCG, Beta Chain, Quant, S: <1 m[IU]/mL (ref <5)

## 2020-12-07 NOTE — MAU Provider Note (Signed)
  S:   19 y.o. G2P1001 @Unknown  by LMP presents to MAU for abdominal cramping & vaginal bleeding. She states she had a positive pregnancy test at home yesterday. Has had bleeding & cramping since then. Reports bright red bleeding - had filled some pads today but states bleeding has slowed down since then. Not passing blood clots.     O: BP 133/73 (BP Location: Right Arm)   Pulse 97   Temp 97.9 F (36.6 C)   Resp 18   Ht 5\' 2"  (1.575 m)   Wt 96.2 kg   LMP 11/01/2020 (Exact Date)   SpO2 99%   BMI 38.78 kg/m  Physical Examination: General appearance - alert, well appearing, and in no distress, oriented to person, place, and time and acyanotic, in no respiratory distress  Results for orders placed or performed during the hospital encounter of 12/07/20 (from the past 48 hour(s))  Urinalysis, Routine w reflex microscopic Urine, Clean Catch     Status: Abnormal   Collection Time: 12/07/20  8:42 PM  Result Value Ref Range   Color, Urine YELLOW YELLOW   APPearance HAZY (A) CLEAR   Specific Gravity, Urine 1.020 1.005 - 1.030   pH 7.0 5.0 - 8.0   Glucose, UA NEGATIVE NEGATIVE mg/dL   Hgb urine dipstick LARGE (A) NEGATIVE   Bilirubin Urine NEGATIVE NEGATIVE   Ketones, ur NEGATIVE NEGATIVE mg/dL   Protein, ur 30 (A) NEGATIVE mg/dL   Nitrite NEGATIVE NEGATIVE   Leukocytes,Ua NEGATIVE NEGATIVE   RBC / HPF >50 (H) 0 - 5 RBC/hpf   WBC, UA 0-5 0 - 5 WBC/hpf   Bacteria, UA RARE (A) NONE SEEN   Squamous Epithelial / LPF 0-5 0 - 5    Comment: Performed at [facility], [address], [place], [address] [place]  Pregnancy, urine POC     Status: None   Collection Time: 12/07/20  8:43 PM  Result Value Ref Range   Preg Test, Ur NEGATIVE NEGATIVE    Comment:        THE SENSITIVITY OF THIS METHODOLOGY IS >24 mIU/mL   hCG, quantitative, pregnancy     Status: None   Collection Time: 12/07/20 10:21 PM  Result Value Ref Range   hCG, Beta Chain, Quant, S <1 <5 mIU/mL    Comment: Performed at  [facility], [address] [place], [address] [place]    A: 1. Negative pregnancy test      P: Discharge home UPT & HCG are negative   [place], NP 1:38 AM

## 2020-12-07 NOTE — MAU Note (Signed)
Judeth Horn NP in [place] Rm to see pt and discuss plan of care. Pt will have blood drawn and then may leave and NP will call pt with results

## 2020-12-07 NOTE — MAU Note (Addendum)
Had positive upt couple days ago. Awoke this morning with abd cramping and have had bleeding throughtout the day. My left upper leg started hurting while at work and continues to hurt. LMP 11/01/20. Having some pain with urination

## 2020-12-25 ENCOUNTER — Other Ambulatory Visit (HOSPITAL_COMMUNITY)
Admission: RE | Admit: 2020-12-25 | Discharge: 2020-12-25 | Disposition: A | Discharge status: Home / Self Care | Payer: Medicaid Other | Source: Ambulatory Visit | Attending: Obstetrics & Gynecology | Admitting: Obstetrics & Gynecology

## 2020-12-25 ENCOUNTER — Other Ambulatory Visit: Payer: Self-pay

## 2020-12-25 ENCOUNTER — Ambulatory Visit (INDEPENDENT_AMBULATORY_CARE_PROVIDER_SITE_OTHER): Payer: Medicaid Other

## 2020-12-25 VITALS — BP 114/72 | HR 92 | Wt 212.0 lb

## 2020-12-25 DIAGNOSIS — N941 Unspecified dyspareunia: Secondary | ICD-10-CM

## 2020-12-25 DIAGNOSIS — R300 Dysuria: Secondary | ICD-10-CM

## 2020-12-25 LAB — POCT URINALYSIS DIPSTICK
Bilirubin, UA: NEGATIVE
Blood, UA: NEGATIVE
Glucose, UA: NEGATIVE
Ketones, UA: NEGATIVE
Nitrite, UA: NEGATIVE
Protein, UA: POSITIVE — AB
Spec Grav, UA: >=1.03 — AB (ref 1.010–1.025)
Urobilinogen, UA: 0.2 E.U./dL
pH, UA: 5 (ref 5.0–8.0)

## 2020-12-25 NOTE — Progress Notes (Signed)
SUBJECTIVE: Michele Carney is a 20 y.o. female who complains of urinary frequency, urgency , back pain and dysuria x 3 days, without  fever, chills, or abnormal vaginal discharge or bleeding.   OBJECTIVE: Appears well, in no apparent distress.  Vital signs are normal. Urine dipstick shows positive for protein and positive for leukocytes.    ASSESSMENT: Dysuria 6/10, right back pain 8/10, painful sex with deep penetration.  PLAN: Treatment per orders. Urine culture, CTGC, Trich, BV, CVag self swab sent to Lab. Call or return to clinic prn if these symptoms worsen or fail to improve as anticipated.

## 2020-12-26 LAB — CERVICOVAGINAL ANCILLARY ONLY
Bacterial Vaginitis (gardnerella): NEGATIVE
Candida Glabrata: NEGATIVE
Candida Vaginitis: POSITIVE — AB
Chlamydia: NEGATIVE
Comment: NEGATIVE
Comment: NEGATIVE
Comment: NEGATIVE
Comment: NEGATIVE
Comment: NEGATIVE
Comment: NORMAL
Neisseria Gonorrhea: NEGATIVE
Trichomonas: NEGATIVE

## 2020-12-27 ENCOUNTER — Telehealth: Payer: Self-pay

## 2020-12-27 LAB — URINE CULTURE

## 2020-12-27 NOTE — Telephone Encounter (Signed)
Pt has seen results wants treatment sent for UTI/yeast I will send diflucan per protocol  Please advise on UTI treatment

## 2021-01-02 ENCOUNTER — Other Ambulatory Visit: Payer: Self-pay

## 2021-01-02 DIAGNOSIS — B3731 Acute candidiasis of vulva and vagina: Secondary | ICD-10-CM

## 2021-01-02 DIAGNOSIS — B373 Candidiasis of vulva and vagina: Secondary | ICD-10-CM

## 2021-01-02 DIAGNOSIS — N390 Urinary tract infection, site not specified: Secondary | ICD-10-CM

## 2021-01-02 MED ORDER — AMOXICILLIN 500 MG PO CAPS: 500.0000 mg | ORAL_CAPSULE | Freq: Three times a day (TID) | ORAL | 0 refills | Status: DC

## 2021-01-02 MED ORDER — FLUCONAZOLE 150 MG PO TABS: 150.0000 mg | ORAL_TABLET | Freq: Once | ORAL | 0 refills | Status: AC

## 2021-01-18 ENCOUNTER — Ambulatory Visit: Payer: Medicaid Other | Admitting: Obstetrics

## 2021-02-13 ENCOUNTER — Ambulatory Visit (INDEPENDENT_AMBULATORY_CARE_PROVIDER_SITE_OTHER): Payer: Medicaid Other | Admitting: Obstetrics and Gynecology

## 2021-02-13 ENCOUNTER — Encounter: Payer: Self-pay | Admitting: Obstetrics and Gynecology

## 2021-02-13 ENCOUNTER — Other Ambulatory Visit: Payer: Self-pay

## 2021-02-13 ENCOUNTER — Other Ambulatory Visit (HOSPITAL_COMMUNITY)
Admission: RE | Admit: 2021-02-13 | Discharge: 2021-02-13 | Disposition: A | Discharge status: Home / Self Care | Payer: Medicaid Other | Source: Ambulatory Visit | Attending: Obstetrics and Gynecology | Admitting: Obstetrics and Gynecology

## 2021-02-13 VITALS — BP 115/78 | HR 99 | Ht 61.0 in | Wt 211.0 lb

## 2021-02-13 DIAGNOSIS — N94819 Vulvodynia, unspecified: Secondary | ICD-10-CM

## 2021-02-13 DIAGNOSIS — M7918 Myalgia, other site: Secondary | ICD-10-CM | POA: Diagnosis not present

## 2021-02-13 DIAGNOSIS — M6289 Other specified disorders of muscle: Secondary | ICD-10-CM

## 2021-02-13 MED ORDER — LIDOCAINE HCL URETHRAL/MUCOSAL 2 % EX GEL: 1.0000 "application " | Freq: Three times a day (TID) | CUTANEOUS | 2 refills | Status: DC

## 2021-02-13 NOTE — Progress Notes (Signed)
GYN presents for painful sex 8/10 X 9 months. Constant lower back pain 10/10 since January.  Denies discharge, odor, NV, fever, chills. C/o constipation with hard stool and blood whenever she wipes.

## 2021-02-13 NOTE — Progress Notes (Signed)
GYNECOLOGY OFFICE VISIT NOTE  History:   Michele Carney is a 20 y.o. G2P1001 here today for ongoing dyspareunia and back pain. She notes pain with intercourse throughout the time. She also cannot use tampons due to pain.  She has had this since at least this past January. She was seen at that time for Nexplanon removal and requested PT. She did PT but not feel like she improved. She has had pain with urination but she feels it on the outside. She does not have any other urinary symptoms. She also notes right sided buttock/back pain that then will sometimes radiate into her right leg. It comes and goes but sometimes is severe. She had an xray with her PCP and they didn't see anything abnormal. She has tried some home exercises as she felt like it was her sciatic nerve.   She hasn't been able to have sex in about one month due to the pain. She also has noticed decreasing sensation with intercourse. She also has pain with her vibrator if she tries to use that. She doesn't usually have vaginal dryness but sometimes mid way through intercourse it will start to feel like her vagina is closing up on her or swelling shut.   She is not nursing a baby at this time.   She is in nursing school.   She denies any abnormal vaginal discharge, bleeding, pelvic pain or other concerns.     Past Medical History:  Diagnosis Date   Anxiety    Depression     Past Surgical History:  Procedure Laterality Date   CESAREAN SECTION N/A 02/08/2020   Procedure: CESAREAN SECTION;  Surgeon: Lazaro Arms, MD;  Location: [facility] LD ORS;  Service: Obstetrics;  Laterality: N/A;    The following portions of the patient's history were reviewed and updated as appropriate: allergies, current medications, past family history, past medical history, past social history, past surgical history and problem list.   Health Maintenance:  She has not yet had a pap smear. We will bring her back in 3 months for annual and  f/u  Review of Systems:  Pertinent items noted in HPI and remainder of comprehensive ROS otherwise negative.  Physical Exam:  BP 115/78   Pulse 99   Ht 5\' 1"  (1.549 m)   Wt 211 lb (95.7 kg)   LMP 01/28/2021 (Approximate)   BMI 39.87 kg/m  CONSTITUTIONAL: Well-developed, well-nourished female in no acute distress.  HEENT:  Normocephalic, atraumatic. External right and left ear normal. No scleral icterus.  NECK: Normal range of motion, supple, no masses noted on observation SKIN: No rash noted. Not diaphoretic. No erythema. No pallor. MUSCULOSKELETAL: Normal range of motion. No edema noted. NEUROLOGIC: Alert and oriented to person, place, and time. Normal muscle tone coordination. No cranial nerve deficit noted. PSYCHIATRIC: Normal mood and affect. Normal behavior. Normal judgment and thought content.  CARDIOVASCULAR: Normal heart rate noted RESPIRATORY: Effort and breath sounds normal, no problems with respiration noted ABDOMEN: No masses noted. No other overt distention noted.    PELVIC: Normal appearing external genitalia; normal urethral meatus; qtip test done and she experience pain on the inner labia minora, the hymen/skenes area - no pain on the perineum. Her pelvic floor muscles are tender especially in the area of the levator muscles. normal appearing vaginal mucosa and cervix.  No abnormal discharge noted.  Normal uterine size, no other palpable masses, no uterine or adnexal tenderness. Performed in the presence of a chaperone  Labs  and Imaging No results found for this or any previous visit (from the past 168 hour(s)). No results found.    Assessment and Plan:  Jamaris V was seen today for dyspareunia.   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 We discussed long term course of therapy.  - Discussed starting first with lidocaine jelly with all touching/intercourse to start. Discussed use and timing. At this  time given the severity, I would hold off on anything in the vagina for about a month. After a month, I would have her initiate PT just for the vulvodynia and not yet the pelvic floor dysfunction. After some time with PT, she could eventually reintroduce intercourse, but would need to premedicate with lidocaine prior to intercourse (15 min) wiping away any excess.  - Other initial treatment options: PFPT, Topical estrogen - Second line - TCAs, SNRI, Gabapentin - compounded topical - We discussed last line would be a vestibulectomy but this would only be if all other options were unsuccessful. We did not discuss surgery in depth as we would have a separate discussion regarding it only if all other therapies didn't work.   -     Cervicovaginal ancillary only( [place]) -     Ambulatory referral to Physical Therapy  Piriformis muscle pain - I think this is the most likely cause of her back pain - recommended exercises for this, but she may also use PT for this purpose as well -     Ambulatory referral to Physical Therapy  Pelvic floor dysfunction in female - Eventually once vulvodynia is improved, she will be able to do PFPT for this purpose as well  - Cultures checked to rule out any other underlying cause   Routine preventative health maintenance measures emphasized. Please refer to After Visit Summary for other counseling recommendations.   Return in about 3 months (around 05/15/2021) for Follow up.    I spent  35  minutes dedicated to the care of this patient including pre-visit review of records, face to face time with the patient discussing her conditions and treatments and post visit orders.    Milas Hock, MD, FACOG Obstetrician & Gynecologist, [facility] for [facility], [facility]

## 2021-02-14 LAB — CERVICOVAGINAL ANCILLARY ONLY
Bacterial Vaginitis (gardnerella): NEGATIVE
Candida Glabrata: NEGATIVE
Candida Vaginitis: NEGATIVE
Chlamydia: NEGATIVE
Comment: NEGATIVE
Comment: NEGATIVE
Comment: NEGATIVE
Comment: NEGATIVE
Comment: NEGATIVE
Comment: NORMAL
Neisseria Gonorrhea: NEGATIVE
Trichomonas: NEGATIVE

## 2021-02-26 ENCOUNTER — Telehealth: Payer: Self-pay | Admitting: *Deleted

## 2021-02-26 ENCOUNTER — Encounter: Payer: Self-pay | Admitting: *Deleted

## 2021-02-26 ENCOUNTER — Other Ambulatory Visit: Payer: Self-pay | Admitting: *Deleted

## 2021-02-26 DIAGNOSIS — N94819 Vulvodynia, unspecified: Secondary | ICD-10-CM

## 2021-02-26 MED ORDER — LIDOCAINE HCL URETHRAL/MUCOSAL 2 % EX GEL: 1.0000 "application " | Freq: Three times a day (TID) | CUTANEOUS | 2 refills | Status: DC

## 2021-02-26 NOTE — Telephone Encounter (Signed)
Returned TC to patient regarding RX for lidocaine. VM full. RX confirmed. Sent on 02/10/21 by Dr. Para March to [facility] [address]

## 2021-02-27 ENCOUNTER — Other Ambulatory Visit: Payer: Self-pay | Admitting: *Deleted

## 2021-02-27 DIAGNOSIS — N94819 Vulvodynia, unspecified: Secondary | ICD-10-CM

## 2021-02-27 MED ORDER — LIDOCAINE HCL URETHRAL/MUCOSAL 2 % EX GEL: 1.0000 "application " | Freq: Three times a day (TID) | CUTANEOUS | 2 refills | Status: DC

## 2021-03-01 ENCOUNTER — Telehealth: Payer: Self-pay | Admitting: *Deleted

## 2021-03-01 NOTE — Progress Notes (Signed)
Follow up on RX for lidocaine sent 02/13/21 by Dr. Para March. RX unavailable for patient pick up. [facility] Pharmacist states Lidocaine 2% jelly was discontinued by manufacturer. New RX for substitute of Lidocaine 5% ointment, with indication for vaginal use, submitted via telephone.

## 2021-03-01 NOTE — Telephone Encounter (Signed)
TC to patient to notify of new lidocaine RX at pharmacy. No answer. Left VM for patient to call the office.

## 2021-03-19 ENCOUNTER — Other Ambulatory Visit: Payer: Self-pay

## 2021-03-19 ENCOUNTER — Ambulatory Visit (INDEPENDENT_AMBULATORY_CARE_PROVIDER_SITE_OTHER): Payer: Medicaid Other

## 2021-03-19 ENCOUNTER — Other Ambulatory Visit (HOSPITAL_COMMUNITY)
Admission: RE | Admit: 2021-03-19 | Discharge: 2021-03-19 | Disposition: A | Discharge status: Home / Self Care | Payer: Medicaid Other | Source: Ambulatory Visit

## 2021-03-19 VITALS — BP 115/72 | HR 92 | Ht 62.0 in | Wt 205.0 lb

## 2021-03-19 DIAGNOSIS — Z01419 Encounter for gynecological examination (general) (routine) without abnormal findings: Secondary | ICD-10-CM

## 2021-03-19 DIAGNOSIS — N94819 Vulvodynia, unspecified: Secondary | ICD-10-CM | POA: Diagnosis not present

## 2021-03-19 DIAGNOSIS — Z113 Encounter for screening for infections with a predominantly sexual mode of transmission: Secondary | ICD-10-CM | POA: Insufficient documentation

## 2021-03-19 LAB — POCT URINALYSIS DIPSTICK
Bilirubin, UA: NEGATIVE
Glucose, UA: NEGATIVE
Ketones, UA: NEGATIVE
Nitrite, UA: NEGATIVE
Protein, UA: NEGATIVE
Spec Grav, UA: >=1.03 — AB (ref 1.010–1.025)
Urobilinogen, UA: 0.2 E.U./dL
pH, UA: 5 (ref 5.0–8.0)

## 2021-03-19 MED ORDER — LIDOCAINE HCL URETHRAL/MUCOSAL 2 % EX GEL: 1.0000 "application " | Freq: Three times a day (TID) | CUTANEOUS | 2 refills | Status: DC

## 2021-03-19 NOTE — Progress Notes (Signed)
Subjective:     Michele Carney is a 20 y.o. female here at [facility] for a routine exam. Current complaints: continues to endorse painful intercourse as well as having external vaginal irritation. This has been ongoing since January. Was evaluated last month for painful intercourse, is using lidocaine jelly without much relief. She has not had intercourse in over a month. She says that she tries to but then the anxiety of knowing that it will be painful gets to her and she cannot follow through. She reports that she also has low back pain and has been evaluated by her PCP for this without any answers as to why she is having back pain. She has tried some exercises at home without relief. She reports that both symptoms started around the same time.This is troublesome to her as it is having an impact on her relationship. She tried pelvic floor PT months ago, but stopped going. She is requesting referral to to try again. Vaginal irritation has been ongoing for approximately 1 week. No vaginal discharge, pelvic pain or abnormal bleeding. Personal health questionnaire reviewed: yes.  Do you have a primary care provider? Yes Do you feel safe at home? yes  Flowsheet Row Office Visit from 03/19/2021 in [facility]  PHQ-2 Total Score 0       Health Maintenance Due  Topic Date Due   COVID-19 Vaccine (1) Never done   INFLUENZA VACCINE  12/18/2020   PAP-Cervical Cytology Screening  02/10/2021   PAP SMEAR-Modifier  02/10/2021     Risk factors for chronic health problems: Smoking: no Alchohol/how much: socially Illicit drug use: no Exercise: no Pt BMI: Body mass index is 37.49 kg/m.   Gynecologic History Patient's last menstrual period was 02/17/2021 (exact date). Contraception: OCP (estrogen/progesterone) Sexual health: Last Pap: n/a d/t age Last mammogram: n/a  Obstetric History OB History  Gravida Para Term Preterm AB Living  2 1 1     1   SAB IAB  Ectopic Multiple Live Births        0 1    # Outcome Date GA Lbr Len/2nd Weight Sex Delivery Anes PTL Lv  2 Gravida           1 Term 02/08/20 [redacted]w[redacted]d  7 lb 11.3 oz (3.495 kg) F CS-LTranv EPI  LIV    The following portions of the patient's history were reviewed and updated as appropriate: allergies, current medications, past family history, past medical history, past social history, past surgical history, and problem list.  Review of Systems Pertinent items are noted in HPI.    Objective:   BP 115/72   Pulse 92   Ht 5\' 2"  (1.575 m)   Wt 205 lb (93 kg)   LMP 02/17/2021 (Exact Date) Comment: OCP  BMI 37.49 kg/m  VS reviewed, nursing note reviewed,  Constitutional: well developed, well nourished, no distress HEENT: normocephalic, neck supple CV: normal rate Pulm/chest wall: normal effort Breast Exam:  Deferred with low risks and shared decision making, discussed recommendation to start mammogram between 40-50 yo/ exam Abdomen: soft Neuro: alert and oriented x 3 Skin: warm, dry Psych: affect normal Pelvic exam: Performed: Cervix pink, visually closed, without lesion, small amount of white creamy discharge, vaginal walls and external genitalia normal Bimanual exam: Cervix 0/long/high, firm, anterior, neg CMT, uterus nontender, nonenlarged, adnexa without tenderness, enlargement, or mass     Assessment/Plan:   1. Well woman exam with routine gynecological exam - Routine well woman  exam - Recommend chiropractor evaluation for ongoing low back pain  - Cytology - PAP( [place]) - POCT Urinalysis Dipstick - Urine Culture  2. Screening examination for STD (sexually transmitted disease) - Requests STD screening  - Cervicovaginal ancillary only( [place]) - RPR - HIV Antibody (routine testing w rflx) - Hepatitis C Antibody - Hepatitis B Surface AntiGEN  3. Vulvodynia - Refill for lidocaine jelly given per patient request - Pelvic floor PT referral made  - Recommend use  of daily vaginal moisturizer for dryness and lube for sexual intercourse   - lidocaine (XYLOCAINE) 2 % jelly; Apply 1 application topically 3 (three) times daily.  Dispense: 30 mL; Refill: 2 - Ambulatory referral to Physical Therapy    Follow up in: 1  year for well woman exam  or as needed.    Brand Males, CNM 03/19/21 4:09 PM

## 2021-03-19 NOTE — Progress Notes (Signed)
GYN presents for AEX/PAP/STD cultures.  C/o vaginal discharge, irritation and urinary frequency x 7 days.

## 2021-03-20 LAB — CERVICOVAGINAL ANCILLARY ONLY
Bacterial Vaginitis (gardnerella): NEGATIVE
Candida Glabrata: NEGATIVE
Candida Vaginitis: POSITIVE — AB
Chlamydia: NEGATIVE
Comment: NEGATIVE
Comment: NEGATIVE
Comment: NEGATIVE
Comment: NEGATIVE
Comment: NEGATIVE
Comment: NORMAL
Neisseria Gonorrhea: NEGATIVE
Trichomonas: NEGATIVE

## 2021-03-20 LAB — RPR: RPR Ser Ql: NONREACTIVE

## 2021-03-20 LAB — HEPATITIS B SURFACE ANTIGEN: Hepatitis B Surface Ag: NEGATIVE

## 2021-03-20 LAB — HEPATITIS C ANTIBODY: Hep C Virus Ab: <0.1 s/co ratio (ref 0.0–0.9)

## 2021-03-20 LAB — HIV ANTIBODY (ROUTINE TESTING W REFLEX): HIV Screen 4th Generation wRfx: NONREACTIVE

## 2021-03-21 LAB — CYTOLOGY - PAP
Diagnosis: NEGATIVE
Diagnosis: REACTIVE

## 2021-03-22 ENCOUNTER — Other Ambulatory Visit: Payer: Self-pay

## 2021-03-22 DIAGNOSIS — B3731 Acute candidiasis of vulva and vagina: Secondary | ICD-10-CM

## 2021-03-22 MED ORDER — FLUCONAZOLE 150 MG PO TABS: 150.0000 mg | ORAL_TABLET | Freq: Once | ORAL | 1 refills | Status: AC

## 2021-04-30 ENCOUNTER — Other Ambulatory Visit: Payer: Self-pay

## 2021-04-30 ENCOUNTER — Ambulatory Visit: Payer: Medicaid Other | Attending: Obstetrics and Gynecology | Admitting: Physical Therapy

## 2021-04-30 ENCOUNTER — Encounter: Payer: Self-pay | Admitting: Physical Therapy

## 2021-04-30 DIAGNOSIS — M545 Low back pain, unspecified: Secondary | ICD-10-CM | POA: Diagnosis present

## 2021-04-30 DIAGNOSIS — R278 Other lack of coordination: Secondary | ICD-10-CM | POA: Insufficient documentation

## 2021-04-30 DIAGNOSIS — R102 Pelvic and perineal pain: Secondary | ICD-10-CM | POA: Diagnosis present

## 2021-04-30 DIAGNOSIS — M6281 Muscle weakness (generalized): Secondary | ICD-10-CM | POA: Insufficient documentation

## 2021-04-30 NOTE — Therapy (Addendum)
[facility] @ [facility] [place], [place], [address] Phone: [phone]   Fax:  [phone]  Physical Therapy Evaluation  Patient Details  Name: Michele Carney MRN: [id number] Date of Birth: 2001-02-06 Referring Provider (PT): Maryagnes Amos, CNM   Encounter Date: 04/30/2021   PT End of Session - 04/30/21 1047     Visit Number 1    Date for PT Re-Evaluation 07/23/21    Authorization Type [employer]    PT Start Time 5638    PT Stop Time 1100    PT Time Calculation (min) 45 min    Activity Tolerance Patient tolerated treatment well    Behavior During Therapy [facility] for tasks assessed/performed             Past Medical History:  Diagnosis Date   Anxiety    Depression     Past Surgical History:  Procedure Laterality Date   CESAREAN SECTION N/A 02/08/2020   Procedure: CESAREAN SECTION;  Surgeon: Florian Buff, MD;  Location: [facility] LD [facility]S;  Service: Obstetrics;  Laterality: N/A;    There were no vitals filed for this visit.    Subjective Assessment - 04/30/21 1021     Subjective Patient went to her Gyn and I was diagnosed with vulvadynia. Put lidocaine on the vagina with intercourse and at night. I have not seen changes. Stopped having sex due to pain. Another provider said I have pelvic floor dysfunction. Patient is getting worse. Patient has not had pain prior to her child. I have had UTI and yeast infection. Periods are terrible and very painful and vagina burning.    Patient Stated Goals reduce pain    Currently in Pain? Yes    Pain Score 8     Pain Location Vagina    Pain Orientation Mid    Pain Descriptors / Indicators Sharp    Pain Type Chronic pain    Pain Onset More than a month ago    Pain Frequency Intermittent    Aggravating Factors  vaginal penetration; when urinating and bowel movements,    Pain Relieving Factors not sure, avoid penetration or tampons    Multiple Pain Sites No                 [facility] PT Assessment - 04/30/21 0001       Assessment   Medical Diagnosis [id number] Vulvodynia    Referring Provider (PT) Maryagnes Amos, CNM    Onset Date/Surgical Date --   05/2020   Prior Therapy yes      Precautions   Precautions None      Restrictions   Weight Bearing Restrictions No      Balance Screen   Has the patient fallen in the past 6 months No    Has the patient had a decrease in activity level because of a fear of falling?  No    Is the patient reluctant to leave their home because of a fear of falling?  No      Home Ecologist residence      Prior Function   Level of Independence Independent    Vocation Requirements standing    Leisure yoga      Cognition   Overall Cognitive Status Within Functional Limits for tasks assessed      Observation/Other Assessments   Skin Integrity decreased mobility of the c-section scar and raised    Focus on Therapeutic Outcomes (FOTO)  PFIQ-7 114; UIQ-7 48; CRAIQ-7 19, POPIQ-7 48      Posture/Postural Control   Posture/Postural Control No significant limitations      ROM / Strength   AROM / PROM / Strength AROM;PROM;Strength      AROM   Lumbar Flexion decreased by 50% with pain    Lumbar - Right Rotation decreased by 25% with pain    Lumbar - Left Rotation decreased by 25% with pain      PROM   Right Hip Flexion 120    Left Hip Flexion 110   pain at endrange     Strength   Right Hip Flexion 4/5      Palpation   Spinal mobility decreased movement of T11-L5    SI assessment  left ilium is post. rotated    Palpation comment tenderness on the pubic bone, throughout the abdomen and umbilicus, and the lumbar paraspinals;      Special Tests    Special Tests Hip Special Tests      Pelvic Dictraction   Findings Positive    Side  Left    Comment pain      Gaenslen's test   Findings Positive    Side  Left    Comments pain      Trendelenburg Test   Findings Positive     Side Right;Left    Comments hip drops                        Objective measurements completed on examination: See above findings.     Pelvic Floor Special Questions - 04/30/21 0001     Are you Pregnant or attempting pregnancy? No    Prior Pregnancies Yes    Number of Pregnancies 1    Number of C-Sections 1    Currently Sexually Active Yes    Is this Painful Yes    Marinoff Scale pain prevents any attempts at intercourse    Urinary Leakage Yes    Activities that cause leaking With strong urge;Coughing;Sneezing;Laughing;Lifting;Running;Exercising    Urinary urgency Yes    Urinary frequency 1 Time every hour    Fecal incontinence No   strain to have a bowel movement, type 1 or 2   Falling out feeling (prolapse) Yes    Activities that cause feeling of prolapse lifting her daughter, heavy lifting, sex    Skin Integrity Intact    Pelvic Floor Internal Exam Patient confirms identification and approves PT to assess pelvic floor and treatment    Exam Type Vaginal    Palpation tenderness on the left pelvic floor muscles, along the left side of the uretha and bladder    Strength weak squeeze, no lift                         PT Short Term Goals - 04/30/21 1634       PT SHORT TERM GOAL #1   Title independent with initial HEP for hip stretches and diaphragmatic breathing    Baseline not educated yet    Time 4    Period Weeks    Status New    Target Date 05/28/21      PT SHORT TERM GOAL #2   Title educated  on vaginal moisturizers and lubricants to improve pelvic floor health    Baseline not educated yet    Time 4    Period Weeks    Status New    Target  Date 05/28/21      PT SHORT TERM GOAL #3   Title pelvis in correct alignment to reduce the tightness in the left pelvic floor    Baseline left ilium is posteriorly rotated    Time 12    Period Weeks    Status New    Target Date 05/28/21               PT Long Term Goals - 04/30/21 1636        PT LONG TERM GOAL #1   Title independent with advanced HEP for core and pelvic floor strengthening    Baseline not educated yet    Time 12    Period Weeks    Status New    Target Date 07/23/21      PT LONG TERM GOAL #2   Title Patient reports minmal to no pain with penile penetration vaginally due to improve tissue health and mobility, Marinoff score 1/3    Baseline Marinoff score is 3/3 and unable to have vaginal penetration due to pain    Time 12    Period Weeks    Status On-going    Target Date 07/23/21      PT LONG TERM GOAL #3   Title Patient reports pelvic pain decreased to </= 2/10 due to pelvic alignment is corrected and reduction of trigger points in the pelvic floor    Baseline pain level is 8/10, left ilium is rotated posteriorly, and trigger points in the left pelvic floor    Time 12    Period Weeks    Status New    Target Date 07/23/21      PT LONG TERM GOAL #4   Title Patient reports urinary leakage with laughing, coughing, and during intercourse has decreased to minimal to none due to pelvic floor strength >/= 3/5    Baseline pelvic floor strength is 2/5    Time 12    Period Weeks    Status New    Target Date 07/23/21      PT LONG TERM GOAL #5   Title burning with urination decreased >/= 2/10 due to improved lengthening of the pelvic floor    Baseline burning at level 8/10 with urination    Time 12    Period Weeks    Status New    Target Date 07/23/21                    Plan - 04/30/21 1526     Clinical Impression Statement Patient is a 20 year old female with vulvodynia for the past year. She reports her vulvar pain is 8/10 with vaginal penetration, using tampons, urinating and bowel movements. Patient has burning in the vaginal area when she urinates. Lumbar flexion decreased by 50% and bilateral rotation decreased by 25%. Hip flexion passively on the right is 120 degrees and left is 110 with pain at the end range. Right hip flexion is  4/5. Left ilium is rotated posteriorly. Postive Gaenslens and distaction test on the right. Positive Trendelenberg bilaterally. Marinoff score is 3/3 due to pain preventing her from having vaginal penetration. Tenderness located throughout the abdomen, lumbar paraspinals, left pelvic floor muscles, and left side of the urethra and bladder. Pelvic strength is 2/5 for lateral walls and anteriorl while posteriorly is 3/5. Patient has falling out feeling with intercourse, lifting heavy items and her daughter. Patient leaks urine with strong urge, laughing, lifting, running and during exercise. Patient has to strain  to have a bowel movement and her stool is Type 1 or 2. Patient will benefit from skilled therapy to reduce pain and improve pelvic floor coordination.    Personal Factors and Comorbidities Comorbidity 1;Sex;Fitness    Comorbidities C-section 02/08/20    Examination-Activity Limitations Continence;Toileting;Stand;Lift;Bend;Caring for Others    Examination-Participation Restrictions Interpersonal Relationship    Stability/Clinical Decision Making Stable/Uncomplicated    Clinical Decision Making Low    Rehab Potential Excellent    PT Frequency 1x / week    PT Duration 12 weeks    PT Treatment/Interventions ADLs/Self Care Home Management;Biofeedback;Cryotherapy;Electrical Stimulation;Moist Heat;Ultrasound;Therapeutic activities;Therapeutic exercise;Neuromuscular re-education;Scar mobilization;Dry needling;Taping;Spinal Manipulations;Joint Manipulations    PT Next Visit Plan correct Left SI joint; lumbar mobilization; manual work to abdomen; kinesio tape to lumbar and right si joint; hip stretches; anterior hip fascial release    Consulted and Agree with Plan of Care Patient             Patient will benefit from skilled therapeutic intervention in order to improve the following deficits and impairments:  Decreased coordination, Increased fascial restricitons, Increased muscle spasms, Decreased  activity tolerance, Pain, Decreased scar mobility, Decreased strength, Decreased mobility  Visit Diagnosis: Muscle weakness (generalized) - Plan: PT plan of care cert/re-cert  Other lack of coordination - Plan: PT plan of care cert/re-cert  Pelvic pain - Plan: PT plan of care cert/re-cert  Acute midline low back pain without sciatica - Plan: PT plan of care cert/re-cert     Problem List Patient Active Problem List   Diagnosis Date Noted   Chronic bilateral low back pain without sciatica 11/30/2020   Iron deficiency anemia 02/10/2020   Cesarean delivery delivered 02/08/2020   Non-reactive NST (non-stress test) 02/06/2020   Significant discrepancy between uterine size and clinical dates, antepartum, second trimester 11/16/2019   Yeast vaginitis 10/14/2019   GBS (group B streptococcus) UTI complicating pregnancy 26/83/4196   Supervision of normal first pregnancy, antepartum 07/05/2019   MDD (major depressive disorder) 05/25/2019   Chronic migraine without aura without status migrainosus, not intractable 12/30/2017    Earlie Counts, PT 04/30/21 4:42 PM  [facility] @ [address] [place], [place], [address] Phone: [phone]   Fax:  [phone]  Name: Michele Carney MRN: [id number] Date of Birth: 02-22-2001  PHYSICAL THERAPY DISCHARGE SUMMARY  Visits from Start of Care: 1  Current functional level related to goals / functional outcomes: See above. Patient did not return after initial visit.    Remaining deficits: See above.    Education / Equipment: HEP   Patient agrees to discharge. Patient goals were not met. Patient is being discharged due to not returning since the last visit. Thank you for the referral. Earlie Counts, PT 07/25/21 7:59 AM

## 2021-05-18 ENCOUNTER — Ambulatory Visit: Payer: Medicaid Other | Admitting: Physical Therapy

## 2021-05-28 ENCOUNTER — Encounter: Payer: Self-pay | Admitting: Physical Therapy

## 2021-06-04 ENCOUNTER — Encounter: Payer: Self-pay | Admitting: Physical Therapy

## 2021-06-04 ENCOUNTER — Other Ambulatory Visit (HOSPITAL_BASED_OUTPATIENT_CLINIC_OR_DEPARTMENT_OTHER): Payer: Self-pay

## 2021-06-04 ENCOUNTER — Other Ambulatory Visit: Payer: Self-pay

## 2021-06-04 ENCOUNTER — Ambulatory Visit: Payer: Medicaid Other | Attending: Internal Medicine

## 2021-06-04 DIAGNOSIS — Z23 Encounter for immunization: Secondary | ICD-10-CM

## 2021-06-04 MED ORDER — COVID-19 MRNA VACC (MODERNA) 100 MCG/0.5ML IM SUSP
INTRAMUSCULAR | 0 refills | Status: DC
  Filled 2021-06-04: qty 0.5, 1d supply, fill #0

## 2021-06-04 NOTE — Progress Notes (Signed)
° °  Covid-19 Vaccination Clinic  Name:  Michele Carney    MRN: [id number] DOB: 03-02-2001  06/04/2021  [name] was observed post Covid-19 immunization for 15 minutes without incident. She was provided with Vaccine Information Sheet and instruction to access the V-Safe system.   [name] was instructed to call 911 with any severe reactions post vaccine: Difficulty breathing  Swelling of face and throat  A fast heartbeat  A bad rash all over body  Dizziness and weakness   Immunizations Administered     Name Date Dose VIS Date Route   Moderna COVID-19 Vaccine 06/04/2021 11:14 AM 0.5 mL 03/08/2020 Intramuscular   Manufacturer: Moderna   Lot: [id number]   NDC: 83419-622-29

## 2021-06-07 ENCOUNTER — Ambulatory Visit: Payer: Self-pay

## 2021-06-07 NOTE — Telephone Encounter (Signed)
°  Chief Complaint: increased HR Symptoms: increased HR, dizziness Frequency: 2-3 days, comes and goes Pertinent Negatives: Patient denies SOB Disposition: [] ED /[] Urgent Care (no appt availability in office) / [] Appointment(In office/virtual)/ [x]  [facility]/ [] Home Care/ [] Refused Recommended Disposition /[] [facility]/ []  Follow-up with PCP Additional Notes: Pt concerned where she had Covid vaccine Monday and has symptoms since. She was able to work today but had episode during work so she stepped away and called. Pt was able to calm self down before ending call.    Reason for Disposition  [1] Heart beating very rapidly (e.g., > 140 / minute) AND [2] not present now  (Exception: during exercise)  Answer Assessment - Initial Assessment Questions 1. MAIN CONCERN OR SYMPTOM:  "What is your main concern right now?" "What question do you have?" "What's the main symptom you're worried about?" (e.g., fever, pain, redness, swelling)     Dizziness, increased heart rate 2. VACCINE: "What vaccination did you receive?" (e.g., none; AstraZeneca, J&J, Moderna, Pfizer, other) "Is this your first, second shot, or booster?" (e.g., first, second, booster)     Moderna 3. SYMPTOM ONSET: "When did the sx begin?" (e.g., not relevant; hours, days)      today 4. SYMPTOM SEVERITY: "How bad is it?"      moderate 5. FEVER: "Is there a fever?" If Yes, ask: "What is it, how was it measured, and when did it start?"      No 6. PAST REACTIONS: "Have you reacted to immunizations before?" If Yes, ask: "What happened?"     No 7. OTHER SYMPTOMS: "Do you have any other symptoms?"     Low grade fever morning after vaccine  Answer Assessment - Initial Assessment Questions 1. DESCRIPTION: "Please describe your heart rate or heartbeat that you are having" (e.g., fast/slow, regular/irregular, skipped or extra beats, "palpitations")     Feels like its beating really fast 2. ONSET: "When did it start?"  (Minutes, hours or days)      2-3 days but today been worse 3. DURATION: "How long does it last" (e.g., seconds, minutes, hours)     Couple of mins 4. PATTERN "Does it come and go, or has it been constant since it started?"  "Does it get worse with exertion?"   "Are you feeling it now?"     Comes and go 6. HEART RATE: "Can you tell me your heart rate?" "How many beats in 15 seconds?"  (Note: not all patients can do this)       86 7. RECURRENT SYMPTOM: "Have you ever had this before?" If Yes, ask: "When was the last time?" and "What happened that time?"      No 10. OTHER SYMPTOMS: "Do you have any other symptoms?" (e.g., dizziness, chest pain, sweating, difficulty breathing)       Dizziness  Protocols used: Coronavirus (COVID-19) Vaccine Questions and Reactions-A-AH, Heart Rate and Heartbeat Questions-A-AH

## 2021-06-11 ENCOUNTER — Encounter: Payer: Self-pay | Admitting: Physical Therapy

## 2021-06-18 ENCOUNTER — Encounter: Payer: Self-pay | Admitting: Physical Therapy

## 2021-06-25 ENCOUNTER — Encounter: Payer: Self-pay | Admitting: Physical Therapy

## 2021-07-12 ENCOUNTER — Ambulatory Visit: Payer: Medicaid Other

## 2021-11-12 ENCOUNTER — Other Ambulatory Visit (HOSPITAL_COMMUNITY)
Admission: RE | Admit: 2021-11-12 | Discharge: 2021-11-12 | Disposition: A | Discharge status: Home / Self Care | Payer: Medicaid Other | Source: Ambulatory Visit | Attending: Obstetrics and Gynecology | Admitting: Obstetrics and Gynecology

## 2021-11-12 ENCOUNTER — Ambulatory Visit (INDEPENDENT_AMBULATORY_CARE_PROVIDER_SITE_OTHER): Payer: Medicaid Other

## 2021-11-12 VITALS — BP 120/75 | HR 80 | Ht 61.0 in | Wt 214.0 lb

## 2021-11-12 DIAGNOSIS — Z113 Encounter for screening for infections with a predominantly sexual mode of transmission: Secondary | ICD-10-CM | POA: Insufficient documentation

## 2021-11-12 DIAGNOSIS — N898 Other specified noninflammatory disorders of vagina: Secondary | ICD-10-CM | POA: Diagnosis present

## 2021-11-13 LAB — CERVICOVAGINAL ANCILLARY ONLY
Bacterial Vaginitis (gardnerella): POSITIVE — AB
Candida Glabrata: NEGATIVE
Candida Vaginitis: POSITIVE — AB
Chlamydia: NEGATIVE
Comment: NEGATIVE
Comment: NEGATIVE
Comment: NEGATIVE
Comment: NEGATIVE
Comment: NEGATIVE
Comment: NORMAL
Neisseria Gonorrhea: NEGATIVE
Trichomonas: NEGATIVE

## 2021-11-13 LAB — RPR: RPR Ser Ql: NONREACTIVE

## 2021-11-13 LAB — HEPATITIS C ANTIBODY: Hep C Virus Ab: NONREACTIVE

## 2021-11-13 LAB — HIV ANTIBODY (ROUTINE TESTING W REFLEX): HIV Screen 4th Generation wRfx: NONREACTIVE

## 2021-11-14 ENCOUNTER — Other Ambulatory Visit: Payer: Self-pay

## 2021-11-14 MED ORDER — FLUCONAZOLE 150 MG PO TABS: 150.0000 mg | ORAL_TABLET | Freq: Once | ORAL | 0 refills | Status: AC

## 2021-11-14 MED ORDER — METRONIDAZOLE 500 MG PO TABS: 500.0000 mg | ORAL_TABLET | Freq: Two times a day (BID) | ORAL | 0 refills | Status: DC

## 2021-12-05 ENCOUNTER — Telehealth: Payer: Self-pay | Admitting: *Deleted

## 2021-12-05 NOTE — Telephone Encounter (Signed)
Returned TC to pt with questions regarding recent TX BV and yeast and request for another type of test. Need clarification. No answer. Left HIPPA compliant VM for pt to call or message for clarification or her request.

## 2021-12-05 NOTE — Telephone Encounter (Signed)
Pt returned TC for clarification of previous call concerns. Pt has recurrent BV and feels that standard TX has failed on more than one occasion. Reports HX of being tested for a different vaginal infection than BV or yeast and is requesting testing but can't recall name of infection. Advised pt that she would need to see a provider for discussion and testing. Pt concerned that no appt available until around 01/06/22. Availability confirmed with front office. No sooner appt available.  Pt will seek care at PCP if they can see her sooner. If not, then she will call back for an appt at [facility]. Advised seeking care at Urgent Care for any urgent concerns. Pt verbalized understanding and all questions were answered. MyChart education on BV sent.

## 2021-12-19 ENCOUNTER — Ambulatory Visit
Admission: EM | Admit: 2021-12-19 | Discharge: 2021-12-19 | Disposition: A | Discharge status: Home / Self Care | Payer: Medicaid Other | Attending: Physician Assistant | Admitting: Physician Assistant

## 2021-12-19 ENCOUNTER — Ambulatory Visit: Admit: 2021-12-19 | Payer: Medicaid Other | Source: Home / Self Care

## 2021-12-19 DIAGNOSIS — R1030 Lower abdominal pain, unspecified: Secondary | ICD-10-CM | POA: Insufficient documentation

## 2021-12-19 LAB — POCT URINALYSIS DIP (MANUAL ENTRY)
Bilirubin, UA: NEGATIVE
Glucose, UA: NEGATIVE mg/dL
Ketones, POC UA: NEGATIVE mg/dL
Leukocytes, UA: NEGATIVE
Nitrite, UA: NEGATIVE
Spec Grav, UA: >=1.03 — AB (ref 1.010–1.025)
Urobilinogen, UA: 0.2 E.U./dL
pH, UA: 6 (ref 5.0–8.0)

## 2021-12-19 LAB — POCT URINE PREGNANCY: Preg Test, Ur: NEGATIVE

## 2021-12-19 NOTE — ED Provider Notes (Signed)
[facility]    CSN: [id number] Arrival date & time: 12/19/21  1541      History   Chief Complaint Chief Complaint  Patient presents with   Back Pain    HPI Michele Carney is a 21 y.o. female.   Patient here today for evaluation of lower back pain, suprapubic pain and frequent urination.  She denies any dysuria.  She states she has had issues with pelvic floor in the past and is not sure if this is currently causing issues.  She would also like screening for BV as she has had this also in the past.  She notes all symptoms started yesterday.  She has not had fever.  She denies any nausea or vomiting.  The history is provided by the patient.    Past Medical History:  Diagnosis Date   Anxiety    Depression     Patient Active Problem List   Diagnosis Date Noted   Chronic bilateral low back pain without sciatica 11/30/2020   Iron deficiency anemia 02/10/2020   Cesarean delivery delivered 02/08/2020   Non-reactive NST (non-stress test) 02/06/2020   Significant discrepancy between uterine size and clinical dates, antepartum, second trimester 11/16/2019   Yeast vaginitis 10/14/2019   GBS (group B streptococcus) UTI complicating pregnancy 08/31/2019   Supervision of normal first pregnancy, antepartum 07/05/2019   MDD (major depressive disorder) 05/25/2019   Chronic migraine without aura without status migrainosus, not intractable 12/30/2017    Past Surgical History:  Procedure Laterality Date   CESAREAN SECTION N/A 02/08/2020   Procedure: CESAREAN SECTION;  Surgeon: Lazaro Arms, MD;  Location: [facility];  Service: Obstetrics;  Laterality: N/A;    OB History     Gravida  2   Para  1   Term  1   Preterm      AB      Living  1      SAB      IAB      Ectopic      Multiple  0   Live Births  1            Home Medications    Prior to Admission medications   Medication Sig Start Date End Date Taking? Authorizing Provider   amoxicillin (AMOXIL) 500 MG capsule Take 1 capsule (500 mg total) by mouth every 8 (eight) hours. Patient not taking: Reported on 02/13/2021 01/02/21   Warden Fillers, MD  ASHWAGANDHA PO Take by mouth. Patient not taking: Reported on 02/13/2021    [provider]  COVID-19 mRNA vaccine, Moderna, 100 MCG/0.5ML injection Inject into the muscle. 06/04/21   Judyann Munson, MD  CRANBERRY PO Take by mouth. Patient not taking: Reported on 02/13/2021    [provider]  FENUGREEK PO Take by mouth. Patient not taking: Reported on 02/13/2021    [provider]  lidocaine (XYLOCAINE) 2 % jelly Apply 1 application topically 3 (three) times daily. 03/19/21   Brand Males, CNM  MAGNESIUM PO Take by mouth. Patient not taking: Reported on 02/13/2021    [provider]  metroNIDAZOLE (FLAGYL) 500 MG tablet Take 1 tablet (500 mg total) by mouth 2 (two) times daily. 11/14/21   Hermina Staggers, MD  norgestimate-ethinyl estradiol (ORTHO-CYCLEN) 0.25-35 MG-MCG tablet Take 1 tablet by mouth daily. 06/15/20   Rasch, Victorino Dike I, NP  [place] PO Take by mouth. Patient not taking: Reported on 02/13/2021    [provider]  Family History Family History  Problem Relation Age of Onset   Diabetes Maternal Grandmother    Cancer Maternal Grandfather        colon   Colon cancer Maternal Grandfather     Social History Social History   Tobacco Use   Smoking status: Never    Passive exposure: Never   Smokeless tobacco: Never  Vaping Use   Vaping Use: Never used  Substance Use Topics   Alcohol use: Not Currently    Alcohol/week: 0.0 - 1.0 standard drinks of alcohol   Drug use: Yes    Types: Marijuana    Comment: occasionally. last smoked 2 wks ago     Allergies   Shellfish allergy   Review of Systems Review of Systems  Constitutional:  Negative for chills and fever.  Respiratory:  Negative for shortness of breath.   Gastrointestinal:  Negative  for abdominal pain, nausea and vomiting.  Genitourinary:  Positive for frequency. Negative for dysuria and flank pain.  Musculoskeletal:  Positive for back pain.     Physical Exam Triage Vital Signs ED Triage Vitals  Enc Vitals Group     BP      Pulse      Resp      Temp      Temp src      SpO2      Weight      Height      Head Circumference      Peak Flow      Pain Score      Pain Loc      Pain Edu?      Excl. in GC?    No data found.  Updated Vital Signs BP 118/80 (BP Location: Left Arm)   Pulse (!) 107   Temp 97.9 F (36.6 C) (Oral)   Resp 18   LMP 11/22/2021   SpO2 97%      Physical Exam Vitals and nursing note reviewed.  Constitutional:      General: She is not in acute distress.    Appearance: Normal appearance. She is not ill-appearing.  HENT:     Head: Normocephalic and atraumatic.     Nose: Nose normal.  Cardiovascular:     Rate and Rhythm: Normal rate and regular rhythm.     Heart sounds: Normal heart sounds. No murmur heard. Pulmonary:     Effort: Pulmonary effort is normal. No respiratory distress.     Breath sounds: Normal breath sounds. No wheezing, rhonchi or rales.  Abdominal:     General: Abdomen is flat. Bowel sounds are normal. There is no distension.     Palpations: Abdomen is soft.     Tenderness: There is abdominal tenderness (mild suprapubic TTP). There is no guarding.  Skin:    General: Skin is warm and dry.  Neurological:     Mental Status: She is alert.  Psychiatric:        Mood and Affect: Mood normal.        Thought Content: Thought content normal.      UC Treatments / Results  Labs (all labs ordered are listed, but only abnormal results are displayed) Labs Reviewed  POCT URINALYSIS DIP (MANUAL ENTRY) - Abnormal; Notable for the following components:      Result Value   Spec Grav, UA >=1.030 (*)    Blood, UA trace-intact (*)    Protein Ur, POC trace (*)    All other components within normal limits  URINE CULTURE  POCT URINE PREGNANCY  CERVICOVAGINAL ANCILLARY ONLY    EKG   Radiology No results found.  Procedures Procedures (including critical care time)  Medications Ordered in UC Medications - No data to display  Initial Impression / Assessment and Plan / UC Course  I have reviewed the triage vital signs and the nursing notes.  Pertinent labs & imaging results that were available during my care of the patient were reviewed by me and considered in my medical decision making (see chart for details).    UA not consistent with UTI.  Will order urine culture as well as STD screening and screening for yeast and BV.  Will await results further recommendation.  Encouraged follow-up with any further concerns while awaiting results.  Final Clinical Impressions(s) / UC Diagnoses   Final diagnoses:  Lower abdominal pain   Discharge Instructions   None    ED Prescriptions   None    PDMP not reviewed this encounter.   Tomi Bamberger, PA-C 12/19/21 1731

## 2021-12-19 NOTE — ED Triage Notes (Signed)
Patient c/o lower back pain and frequent urination. The patient is requesting testing for BV. Started: yesterday

## 2021-12-20 ENCOUNTER — Telehealth (HOSPITAL_COMMUNITY): Payer: Self-pay | Admitting: Emergency Medicine

## 2021-12-20 LAB — URINE CULTURE: Culture: 10000 — AB

## 2021-12-20 LAB — CERVICOVAGINAL ANCILLARY ONLY
Bacterial Vaginitis (gardnerella): NEGATIVE
Candida Glabrata: NEGATIVE
Candida Vaginitis: POSITIVE — AB
Chlamydia: NEGATIVE
Comment: NEGATIVE
Comment: NEGATIVE
Comment: NEGATIVE
Comment: NEGATIVE
Comment: NEGATIVE
Comment: NORMAL
Neisseria Gonorrhea: NEGATIVE
Trichomonas: NEGATIVE

## 2021-12-20 MED ORDER — FLUCONAZOLE 150 MG PO TABS: 150.0000 mg | ORAL_TABLET | Freq: Once | ORAL | 0 refills | Status: AC

## 2022-01-09 ENCOUNTER — Ambulatory Visit: Payer: Medicaid Other | Admitting: Family Medicine

## 2022-01-09 ENCOUNTER — Other Ambulatory Visit (HOSPITAL_COMMUNITY)
Admission: RE | Admit: 2022-01-09 | Discharge: 2022-01-09 | Disposition: A | Discharge status: Home / Self Care | Payer: Medicaid Other | Source: Ambulatory Visit | Attending: Student | Admitting: Student

## 2022-01-09 ENCOUNTER — Encounter: Payer: Self-pay | Admitting: Family Medicine

## 2022-01-09 VITALS — BP 122/78 | HR 91 | Ht 62.0 in | Wt 212.0 lb

## 2022-01-09 DIAGNOSIS — R300 Dysuria: Secondary | ICD-10-CM

## 2022-01-09 DIAGNOSIS — N760 Acute vaginitis: Secondary | ICD-10-CM

## 2022-01-09 LAB — POCT URINALYSIS DIPSTICK
Bilirubin, UA: POSITIVE
Blood, UA: POSITIVE
Glucose, UA: NEGATIVE
Ketones, UA: POSITIVE
Nitrite, UA: NEGATIVE
Protein, UA: POSITIVE — AB
Spec Grav, UA: 1.02 (ref 1.010–1.025)
Urobilinogen, UA: 0.2 E.U./dL
pH, UA: 8 (ref 5.0–8.0)

## 2022-01-09 NOTE — Progress Notes (Addendum)
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  Denies fever, NV, chills. GC/CT Cultures done 12/19/21 shows Yeast and Urine culture +GBS.  Last PAP 03/19/2021

## 2022-01-09 NOTE — Progress Notes (Signed)
   GYNECOLOGY OFFICE VISIT NOTE  History:   Michele Carney is a 21 y.o. G2P1001 here today for vaginal irritation and discharge. She denies any abnormal vaginal discharge, bleeding, pelvic pain or other concerns.  She reports recurrent symptoms in the last 8 months since she had a baby. Most prominently: vaginal itching, white discharge, pain with intercourse, dysuria.  No abdominal pain, fever or chills.  She has had multiple treatments with PO fluconazole, and topical clotrimazole, with transient benefit. Also had metronidazole for BV, but feels symptoms have been recurrent.    Past Medical History:  Diagnosis Date   Anxiety    Depression     Past Surgical History:  Procedure Laterality Date   CESAREAN SECTION N/A 02/08/2020   Procedure: CESAREAN SECTION;  Surgeon: Lazaro Arms, MD;  Location: [facility];  Service: Obstetrics;  Laterality: N/A;    The following portions of the patient's history were reviewed and updated as appropriate: allergies, current medications, past family history, past medical history, past social history, past surgical history and problem list.   Health Maintenance:  Normal pap  02/2021.   Review of Systems:  Pertinent items noted in HPI and remainder of comprehensive ROS otherwise negative.  Physical Exam:  BP 122/78   Pulse 91   Ht 5\' 2"  (1.575 m)   Wt 212 lb (96.2 kg)   LMP 01/03/2022 (Exact Date)   BMI 38.78 kg/m  Gen: alert, active, not in distress Abdomen: soft, non-tender Pelvis: normal external genitalia. No obvious discharge, cervix shows some irritation, but not friable, no CMT. No adnexal fullness.  Labs and Imaging Results for orders placed or performed in visit on 01/09/22 (from the past 168 hour(s))  POCT Urinalysis Dipstick   Collection Time: 01/09/22 11:55 AM  Result Value Ref Range   Color, UA YELLOW    Clarity, UA HAZY    Glucose, UA Negative Negative   Bilirubin, UA POSITIVE    Ketones, UA POSITIVE    Spec Grav,  UA 1.020 1.010 - 1.025   Blood, UA POSITIVE    pH, UA 8.0 5.0 - 8.0   Protein, UA Positive (A) Negative   Urobilinogen, UA 0.2 0.2 or 1.0 E.U./dL   Nitrite, UA NEGATIVE    Leukocytes, UA Moderate (2+) (A) Negative   Appearance     Odor     No results found.     Reviewed recent ER visit note from 12/19/2021  Assessment and Plan:   1. Dysuria POC UA consistent with UTI. No evidence of pyelonephritis. Most recent urine culture showed Grp B strept, did not receive antibiotics. Given her other symptoms, will send urine for culture and treat based on findings. - POCT Urinalysis Dipstick - Urine Culture  2. Recurrent vaginitis Possibly recurrent BV vs non albicans candida vs ureaplasma. Multiple swab samples collected to day to evaluate for this. Not likely PID. - Candida 6 Species Profile, NAA - Mycoplasma / ureaplasma culture - Cervicovaginal ancillary only( [place]) - Urine Culture    Routine preventative health maintenance measures emphasized. Please refer to After Visit Summary for other counseling recommendations.   I spent 35 mins in patient's encounter, including reviewing charts, face to face time and counseling.  02/18/2022 MD MPH OB Fellow, [facility]

## 2022-01-10 LAB — CERVICOVAGINAL ANCILLARY ONLY
Bacterial Vaginitis (gardnerella): NEGATIVE
Candida Glabrata: NEGATIVE
Candida Vaginitis: NEGATIVE
Chlamydia: NEGATIVE
Comment: NEGATIVE
Comment: NEGATIVE
Comment: NEGATIVE
Comment: NEGATIVE
Comment: NEGATIVE
Comment: NORMAL
Neisseria Gonorrhea: NEGATIVE
Trichomonas: NEGATIVE

## 2022-01-11 LAB — URINE CULTURE

## 2022-01-12 LAB — CANDIDA 6 SPECIES PROFILE, NAA
C PARAPSILOSIS/TROPICALIS: NEGATIVE
Candida albicans, NAA: NEGATIVE
Candida glabrata, NAA: NEGATIVE
Candida krusei, NAA: NEGATIVE
Candida lusitaniae, NAA: NEGATIVE

## 2022-01-16 ENCOUNTER — Other Ambulatory Visit: Payer: Self-pay | Admitting: Family Medicine

## 2022-01-16 DIAGNOSIS — N760 Acute vaginitis: Secondary | ICD-10-CM

## 2022-01-16 DIAGNOSIS — A493 Mycoplasma infection, unspecified site: Secondary | ICD-10-CM

## 2022-01-16 DIAGNOSIS — N341 Nonspecific urethritis: Secondary | ICD-10-CM

## 2022-01-16 LAB — MYCOPLASMA / UREAPLASMA CULTURE
Mycoplasma hominis Culture: NEGATIVE
Ureaplasma urealyticum: POSITIVE — AB

## 2022-01-16 MED ORDER — AZITHROMYCIN 500 MG PO TABS: 500.0000 mg | ORAL_TABLET | Freq: Every day | ORAL | 1 refills | Status: AC

## 2022-01-16 MED ORDER — METRONIDAZOLE 500 MG PO TABS: 2000.0000 mg | ORAL_TABLET | Freq: Once | ORAL | 1 refills | Status: AC

## 2022-02-05 ENCOUNTER — Encounter: Payer: Self-pay | Admitting: Emergency Medicine

## 2022-02-05 ENCOUNTER — Ambulatory Visit
Admission: EM | Admit: 2022-02-05 | Discharge: 2022-02-05 | Disposition: A | Discharge status: Home / Self Care | Payer: Medicaid Other | Attending: Physician Assistant | Admitting: Physician Assistant

## 2022-02-05 ENCOUNTER — Ambulatory Visit (INDEPENDENT_AMBULATORY_CARE_PROVIDER_SITE_OTHER): Payer: Medicaid Other

## 2022-02-05 DIAGNOSIS — M25572 Pain in left ankle and joints of left foot: Secondary | ICD-10-CM | POA: Diagnosis not present

## 2022-02-05 DIAGNOSIS — S96912A Strain of unspecified muscle and tendon at ankle and foot level, left foot, initial encounter: Secondary | ICD-10-CM

## 2022-02-05 DIAGNOSIS — W19XXXA Unspecified fall, initial encounter: Secondary | ICD-10-CM | POA: Diagnosis not present

## 2022-02-05 MED ORDER — IBUPROFEN 600 MG PO TABS: 600.0000 mg | ORAL_TABLET | Freq: Three times a day (TID) | ORAL | 0 refills | Status: DC

## 2022-02-05 NOTE — ED Triage Notes (Addendum)
Pt is present today with left ankle pain. Pt states that se fell at school today and noticed a pulsating pain afterwards.

## 2022-02-05 NOTE — Discharge Instructions (Addendum)
Advised ibuprofen 600 to 800 mg every 8 hours with food for pain relief and swelling. Advised to use ice therapy, 10 minutes on 20 minutes off, 3-4 times throughout the day to help relieve the pain and swelling. Advised to use the ankle Ace wrap for added support when up and walking around. Advised to follow-up with PCP or return to urgent care if symptoms fail to improve.

## 2022-02-05 NOTE — ED Provider Notes (Signed)
[facility]    CSN: [id number] Arrival date & time: 02/05/22  1325      History   Chief Complaint Chief Complaint  Patient presents with   Fall    HPI Michele Carney is a 21 y.o. female.   21 year old female presents with left ankle pain.  Patient indicates today she was walking when she tripped and twisted her left ankle turning it in and falling on it.  Patient indicates since then she has been having pain and tenderness along the outside of the left ankle, worse with walking.  The pain causes her to limp.  She has not taken any medication OTC to help to relieve the discomfort.  She is just wanting to make sure that the ankle is not broken.   Fall    Past Medical History:  Diagnosis Date   Anxiety    Depression     Patient Active Problem List   Diagnosis Date Noted   Chronic bilateral low back pain without sciatica 11/30/2020   Iron deficiency anemia 02/10/2020   Cesarean delivery delivered 02/08/2020   Non-reactive NST (non-stress test) 02/06/2020   Significant discrepancy between uterine size and clinical dates, antepartum, second trimester 11/16/2019   Yeast vaginitis 10/14/2019   GBS (group B streptococcus) UTI complicating pregnancy 99991111   Supervision of normal first pregnancy, antepartum 07/05/2019   MDD (major depressive disorder) 05/25/2019   Chronic migraine without aura without status migrainosus, not intractable 12/30/2017    Past Surgical History:  Procedure Laterality Date   CESAREAN SECTION N/A 02/08/2020   Procedure: CESAREAN SECTION;  Surgeon: Florian Buff, MD;  Location: [facility];  Service: Obstetrics;  Laterality: N/A;    OB History     Gravida  2   Para  1   Term  1   Preterm      AB      Living  1      SAB      IAB      Ectopic      Multiple  0   Live Births  1            Home Medications    Prior to Admission medications   Medication Sig Start Date End Date Taking? Authorizing  Provider  ibuprofen (ADVIL) 600 MG tablet Take 1 tablet (600 mg total) by mouth 3 (three) times daily. 02/05/22  Yes Nyoka Lint, PA-C  lidocaine (XYLOCAINE) 2 % jelly Apply 1 application topically 3 (three) times daily. Patient not taking: Reported on 01/09/2022 03/19/21   Renee Harder, CNM    Family History Family History  Problem Relation Age of Onset   Diabetes Maternal Grandmother    Cancer Maternal Grandfather        colon   Colon cancer Maternal Grandfather     Social History Social History   Tobacco Use   Smoking status: Never    Passive exposure: Never   Smokeless tobacco: Never  Vaping Use   Vaping Use: Never used  Substance Use Topics   Alcohol use: Not Currently    Alcohol/week: 0.0 - 1.0 standard drinks of alcohol   Drug use: Not Currently    Types: Marijuana    Comment: occasionally. last smoked 2 wks ago     Allergies   Shellfish allergy   Review of Systems Review of Systems  Musculoskeletal:  Positive for joint swelling (left ankle).     Physical Exam Triage Vital Signs ED Triage Vitals  Enc Vitals Group     BP 02/05/22 1336 (!) 153/82     Pulse Rate 02/05/22 1336 90     Resp 02/05/22 1336 18     Temp 02/05/22 1336 98.3 F (36.8 C)     Temp src --      SpO2 02/05/22 1336 98 %     Weight --      Height --      Head Circumference --      Peak Flow --      Pain Score 02/05/22 1335 7     Pain Loc --      Pain Edu? --      Excl. in [place]? --    No data found.  Updated Vital Signs BP (!) 153/82   Pulse 90   Temp 98.3 F (36.8 C)   Resp 18   LMP 01/03/2022 (Exact Date)   SpO2 98%   Breastfeeding No   Visual Acuity Right Eye Distance:   Left Eye Distance:   Bilateral Distance:    Right Eye Near:   Left Eye Near:    Bilateral Near:     Physical Exam Constitutional:      Appearance: Normal appearance.  Musculoskeletal:       Legs:     Comments: Left ankle: Pain is palpated along the lateral malleolus anteriorly, no  swelling present, or redness.  Full range of motion is normal with pain on internal rotation.  Stability is intact, strength is normal.  No crepitus with motion.  Neurological:     Mental Status: She is alert.      UC Treatments / Results  Labs (all labs ordered are listed, but only abnormal results are displayed) Labs Reviewed - No data to display  EKG   Radiology DG Ankle Complete Left  Result Date: 02/05/2022 CLINICAL DATA:  Left ankle pain after fall at school today. EXAM: LEFT ANKLE COMPLETE - 3+ VIEW COMPARISON:  None Available. FINDINGS: Normal bone mineralization. Joint spaces are preserved. The ankle mortise is symmetric and intact. No acute fracture is seen. No dislocation. IMPRESSION: Normal left ankle radiographs. Electronically Signed   By: Yvonne Kendall M.D.   On: 02/05/2022 13:56    Procedures Procedures (including critical care time)  Medications Ordered in UC Medications - No data to display  Initial Impression / Assessment and Plan / UC Course  I have reviewed the triage vital signs and the nursing notes.  Pertinent labs & imaging results that were available during my care of the patient were reviewed by me and considered in my medical decision making (see chart for details).       Plan: 1.  Advised take ibuprofen 600 to 800 mg every 8 hours with food to help reduce the pain and swelling. 2.  Advised use ice therapy, 10 minutes on 20 minutes off, 3-4 times throughout the day to help reduce the pain and swelling. 3.  Advised to use the Ace wrap when up and standing and walking around to help give added support. 4.  Advised to follow-up PCP or return to urgent care if symptoms fail to improve. Final Clinical Impressions(s) / UC Diagnoses   Final diagnoses:  Acute left ankle pain  Strain of left ankle, initial encounter     Discharge Instructions      Advised ibuprofen 600 to 800 mg every 8 hours with food for pain relief and swelling. Advised to use  ice therapy, 10 minutes on 20 minutes off,  3-4 times throughout the day to help relieve the pain and swelling. Advised to use the ankle Ace wrap for added support when up and walking around. Advised to follow-up with PCP or return to urgent care if symptoms fail to improve.     ED Prescriptions     Medication Sig Dispense Auth. Provider   ibuprofen (ADVIL) 600 MG tablet Take 1 tablet (600 mg total) by mouth 3 (three) times daily. 30 tablet Nyoka Lint, PA-C      PDMP not reviewed this encounter.   Nyoka Lint, PA-C 02/05/22 1402

## 2022-02-21 ENCOUNTER — Encounter: Payer: Medicaid Other | Admitting: Family Medicine

## 2022-03-07 ENCOUNTER — Ambulatory Visit (INDEPENDENT_AMBULATORY_CARE_PROVIDER_SITE_OTHER): Payer: Medicaid Other | Admitting: Family Medicine

## 2022-03-07 ENCOUNTER — Encounter: Payer: Self-pay | Admitting: Family Medicine

## 2022-03-07 VITALS — BP 102/67 | HR 96 | Temp 98.2°F | Resp 14 | Ht 62.0 in | Wt 217.8 lb

## 2022-03-07 DIAGNOSIS — D509 Iron deficiency anemia, unspecified: Secondary | ICD-10-CM | POA: Diagnosis not present

## 2022-03-07 DIAGNOSIS — M545 Low back pain, unspecified: Secondary | ICD-10-CM

## 2022-03-07 DIAGNOSIS — Z0000 Encounter for general adult medical examination without abnormal findings: Secondary | ICD-10-CM

## 2022-03-07 DIAGNOSIS — G8929 Other chronic pain: Secondary | ICD-10-CM

## 2022-03-07 DIAGNOSIS — Z111 Encounter for screening for respiratory tuberculosis: Secondary | ICD-10-CM

## 2022-03-07 DIAGNOSIS — N912 Amenorrhea, unspecified: Secondary | ICD-10-CM

## 2022-03-07 DIAGNOSIS — J0100 Acute maxillary sinusitis, unspecified: Secondary | ICD-10-CM | POA: Diagnosis not present

## 2022-03-07 DIAGNOSIS — Z0184 Encounter for antibody response examination: Secondary | ICD-10-CM | POA: Diagnosis not present

## 2022-03-07 LAB — POCT URINE PREGNANCY: Preg Test, Ur: NEGATIVE

## 2022-03-07 MED ORDER — DOXYCYCLINE HYCLATE 100 MG PO TABS: 100.0000 mg | ORAL_TABLET | Freq: Two times a day (BID) | ORAL | 0 refills | Status: DC

## 2022-03-07 NOTE — Assessment & Plan Note (Signed)
Referral made to PT

## 2022-03-07 NOTE — Assessment & Plan Note (Signed)
Treatment with doxycycline. Recommend increased fluids and rest.  May utilize OTC medications as needed for symptom control.

## 2022-03-07 NOTE — Patient Instructions (Signed)

## 2022-03-07 NOTE — Assessment & Plan Note (Signed)
Well adult Orders Placed This Encounter  Procedures  . QuantiFERON-TB Gold Plus  . COMPLETE METABOLIC PANEL WITH GFR  . CBC with Differential  . TSH  . Hepatitis B surface antibody,quantitative  . Ambulatory referral to Physical Therapy    Referral Priority:   Routine    Referral Type:   Physical Medicine    Referral Reason:   Specialty Services Required    Requested Specialty:   Physical Therapy    Number of Visits Requested:   1  . POCT urine pregnancy   Screenings: per lab orders Immunizations: Defer flu and COVID until feeling better Anticipatory guidance/Risk factor reduction:  Recommendations per AVS.

## 2022-03-07 NOTE — Progress Notes (Signed)
DARIUS FILLINGIM - 21 y.o. female MRN [id number]  Date of birth: 08/13/2000  Subjective Chief Complaint  Patient presents with   Annual Exam    Needing school physical form completed- also needing blood TB testing     HPI Yarielys BERNISE SYLVAIN is a 21 y.o. Marland Kitchensex here today for annual exam.   Reports that she is doing ok.  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 She continues to have intermittent low back pain.  Pain is non-radicular.    She did miss her period this month.  UPT is negative at home.   She has had worsening sinus pain and pressure over the past week.  Doesn't seem to be improving.  Mucus is green/brown with some blood streaked in.    Physical activity is decreased some.  She reports that her diet is pretty good  She is a non-smoker.  Denies EtOH use.   She would like flu vaccine today.  However she thinks she may have a sinus infection   Review of Systems  Constitutional:  Negative for chills, fever, malaise/fatigue and weight loss.  HENT:  Negative for congestion, ear pain and sore throat.   Eyes:  Negative for blurred vision, double vision and pain.  Respiratory:  Negative for cough and shortness of breath.   Cardiovascular:  Negative for chest pain and palpitations.  Gastrointestinal:  Negative for abdominal pain, blood in stool, constipation, heartburn and nausea.  Genitourinary:  Negative for dysuria and urgency.  Musculoskeletal:  Negative for joint pain and myalgias.  Neurological:  Negative for dizziness and headaches.  Endo/Heme/Allergies:  Does not bruise/bleed easily.  Psychiatric/Behavioral:  Negative for depression. The patient is not nervous/anxious and does not have insomnia.     Allergies  Allergen Reactions   Shellfish Allergy Rash         Past Medical History:  Diagnosis Date   Anxiety    Depression     Past Surgical History:  Procedure Laterality Date   CESAREAN SECTION N/A 02/08/2020   Procedure: CESAREAN SECTION;  Surgeon:  Lazaro Arms, MD;  Location: [facility];  Service: Obstetrics;  Laterality: N/A;    Social History   Socioeconomic History   Marital status: Significant Other    Spouse name: Not on file   Number of children: Not on file   Years of education: Not on file   Highest education level: High school graduate  Occupational History   Not on file  Tobacco Use   Smoking status: Never    Passive exposure: Never   Smokeless tobacco: Never  Vaping Use   Vaping Use: Never used  Substance and Sexual Activity   Alcohol use: Not Currently    Alcohol/week: 0.0 - 1.0 standard drinks of alcohol   Drug use: Not Currently    Types: Marijuana    Comment: occasionally. last smoked 2 wks ago   Sexual activity: Yes    Birth control/protection: None    Comment: on the patch prior  Other Topics Concern   Not on file  Social History Narrative   Not on file   Social Determinants of Health   Financial Resource Strain: Low Risk  (07/05/2019)   Overall Financial Resource Strain (CARDIA)    Difficulty of Paying Living Expenses: Not hard at all  Food Insecurity: No Food Insecurity (07/05/2019)   Hunger Vital Sign    Worried About Running Out of Food in the Last Year: Never true    Ran Out of Food  in the Last Year: Never true  Transportation Needs: No Transportation Needs (07/05/2019)   PRAPARE - Hydrologist (Medical): No    Lack of Transportation (Non-Medical): No  Physical Activity: Not on file  Stress: No Stress Concern Present (07/05/2019)   [place]    Feeling of Stress : Not at all  Social Connections: Not on file    Family History  Problem Relation Age of Onset   Diabetes Maternal Grandmother    Cancer Maternal Grandfather        colon   Colon cancer Maternal Grandfather     Health Maintenance  Topic Date Due   COVID-19 Vaccine (2 - Moderna series) 03/23/2022 (Originally 07/30/2021)    INFLUENZA VACCINE  08/18/2022 (Originally 12/18/2021)   CHLAMYDIA SCREENING  01/10/2023   PAP-Cervical Cytology Screening  03/19/2024   PAP SMEAR-Modifier  03/19/2024   TETANUS/TDAP  11/15/2029   HPV VACCINES  Completed   Hepatitis C Screening  Completed   HIV Screening  Completed     ----------------------------------------------------------------------------------------------------------------------------------------------------------------------------------------------------------------- Physical Exam BP 102/67   Pulse 96   Temp 98.2 F (36.8 C)   Resp 14   Ht 5\' 2"  (1.575 m)   Wt 217 lb 12 oz (98.8 kg)   SpO2 96%   BMI 39.83 kg/m   Physical Exam Constitutional:      General: She is not in acute distress. HENT:     Head: Normocephalic and atraumatic.     Right Ear: Tympanic membrane and ear canal normal.     Left Ear: Tympanic membrane and ear canal normal.     Nose: Nose normal.     Mouth/Throat:     Comments: Maxillary sinus tenderness Eyes:     General: No scleral icterus.    Conjunctiva/sclera: Conjunctivae normal.  Neck:     Thyroid: No thyromegaly.  Cardiovascular:     Rate and Rhythm: Normal rate and regular rhythm.     Heart sounds: Normal heart sounds.  Pulmonary:     Effort: Pulmonary effort is normal.     Breath sounds: Normal breath sounds.  Abdominal:     General: Bowel sounds are normal. There is no distension.     Palpations: Abdomen is soft.     Tenderness: There is no abdominal tenderness. There is no guarding.  Musculoskeletal:        General: Normal range of motion.     Cervical back: Normal range of motion and neck supple.  Lymphadenopathy:     Cervical: No cervical adenopathy.  Skin:    General: Skin is warm and dry.     Findings: No rash.  Neurological:     General: No focal deficit present.     Mental Status: She is alert and oriented to person, place, and time.     Cranial Nerves: No cranial nerve deficit.     Coordination:  Coordination normal.  Psychiatric:        Mood and Affect: Mood normal.        Behavior: Behavior normal.     ------------------------------------------------------------------------------------------------------------------------------------------------------------------------------------------------------------------- Assessment and Plan  Acute maxillary sinusitis Treatment with doxycycline. Recommend increased fluids and rest.  May utilize OTC medications as needed for symptom control.   Well adult exam Well adult Orders Placed This Encounter  Procedures   QuantiFERON-TB Gold Plus   COMPLETE METABOLIC PANEL WITH GFR   CBC with Differential   TSH   Hepatitis B surface antibody,quantitative  Ambulatory referral to Physical Therapy    Referral Priority:   Routine    Referral Type:   Physical Medicine    Referral Reason:   Specialty Services Required    Requested Specialty:   Physical Therapy    Number of Visits Requested:   1   POCT urine pregnancy   Screenings: per lab orders Immunizations: Defer flu and COVID until feeling better Anticipatory guidance/Risk factor reduction:  Recommendations per AVS.   Chronic bilateral low back pain without sciatica Referral made to PT.     Meds ordered this encounter  Medications   doxycycline (VIBRA-TABS) 100 MG tablet    Sig: Take 1 tablet (100 mg total) by mouth 2 (two) times daily.    Dispense:  20 tablet    Refill:  0    No follow-ups on file.    This visit occurred during the SARS-CoV-2 public health emergency.  Safety protocols were in place, including screening questions prior to the visit, additional usage of staff PPE, and extensive cleaning of exam room while observing appropriate contact time as indicated for disinfecting solutions.

## 2022-03-13 LAB — COMPLETE METABOLIC PANEL WITH GFR
AG Ratio: 1.4 (calc) (ref 1.0–2.5)
ALT: 16 U/L (ref 6–29)
AST: 15 U/L (ref 10–30)
Albumin: 4.1 g/dL (ref 3.6–5.1)
Alkaline phosphatase (APISO): 77 U/L (ref 31–125)
BUN/Creatinine Ratio: 19 (calc) (ref 6–22)
BUN: 8 mg/dL (ref 7–25)
CO2: 26 mmol/L (ref 20–32)
Calcium: 9.2 mg/dL (ref 8.6–10.2)
Chloride: 105 mmol/L (ref 98–110)
Creat: 0.42 mg/dL — ABNORMAL LOW (ref 0.50–0.96)
Globulin: 2.9 g/dL (calc) (ref 1.9–3.7)
Glucose, Bld: 95 mg/dL (ref 65–99)
Potassium: 4.2 mmol/L (ref 3.5–5.3)
Sodium: 138 mmol/L (ref 135–146)
Total Bilirubin: 0.4 mg/dL (ref 0.2–1.2)
Total Protein: 7 g/dL (ref 6.1–8.1)
eGFR: 142 mL/min/{1.73_m2} (ref >=60)

## 2022-03-13 LAB — CBC WITH DIFFERENTIAL/PLATELET
Absolute Monocytes: 491 cells/uL (ref 200–950)
Basophils Absolute: 31 cells/uL (ref 0–200)
Basophils Relative: 0.4 %
Eosinophils Absolute: 94 cells/uL (ref 15–500)
Eosinophils Relative: 1.2 %
HCT: 39.2 % (ref 35.0–45.0)
Hemoglobin: 12.1 g/dL (ref 11.7–15.5)
Lymphs Abs: 3689 cells/uL (ref 850–3900)
MCH: 22.8 pg — ABNORMAL LOW (ref 27.0–33.0)
MCHC: 30.9 g/dL — ABNORMAL LOW (ref 32.0–36.0)
MCV: 74 fL — ABNORMAL LOW (ref 80.0–100.0)
MPV: 11.6 fL (ref 7.5–12.5)
Monocytes Relative: 6.3 %
Neutro Abs: 3494 cells/uL (ref 1500–7800)
Neutrophils Relative %: 44.8 %
Platelets: 276 10*3/uL (ref 140–400)
RBC: 5.3 10*6/uL — ABNORMAL HIGH (ref 3.80–5.10)
RDW: 15.7 % — ABNORMAL HIGH (ref 11.0–15.0)
Total Lymphocyte: 47.3 %
WBC: 7.8 10*3/uL (ref 3.8–10.8)

## 2022-03-13 LAB — QUANTIFERON-TB GOLD PLUS
Mitogen-NIL: >10 IU/mL
NIL: 0.03 IU/mL
QuantiFERON-TB Gold Plus: NEGATIVE
TB1-NIL: 0 IU/mL
TB2-NIL: <0 IU/mL

## 2022-03-13 LAB — HEPATITIS B SURFACE ANTIBODY, QUANTITATIVE: Hep B S AB Quant (Post): <5 m[IU]/mL — ABNORMAL LOW (ref >=10)

## 2022-03-13 LAB — TSH: TSH: 0.9 mIU/L

## 2022-03-21 ENCOUNTER — Other Ambulatory Visit (HOSPITAL_COMMUNITY)
Admission: RE | Admit: 2022-03-21 | Discharge: 2022-03-21 | Disposition: A | Discharge status: Home / Self Care | Payer: Medicaid Other | Source: Ambulatory Visit | Attending: Student | Admitting: Student

## 2022-03-21 ENCOUNTER — Encounter: Payer: Self-pay | Admitting: Student

## 2022-03-21 ENCOUNTER — Ambulatory Visit (INDEPENDENT_AMBULATORY_CARE_PROVIDER_SITE_OTHER): Payer: Medicaid Other | Admitting: Student

## 2022-03-21 VITALS — BP 119/78 | HR 101 | Ht 62.0 in | Wt 218.2 lb

## 2022-03-21 DIAGNOSIS — G43709 Chronic migraine without aura, not intractable, without status migrainosus: Secondary | ICD-10-CM

## 2022-03-21 DIAGNOSIS — Z01419 Encounter for gynecological examination (general) (routine) without abnormal findings: Secondary | ICD-10-CM | POA: Diagnosis present

## 2022-03-21 DIAGNOSIS — N926 Irregular menstruation, unspecified: Secondary | ICD-10-CM | POA: Diagnosis not present

## 2022-03-21 DIAGNOSIS — N941 Unspecified dyspareunia: Secondary | ICD-10-CM

## 2022-03-21 DIAGNOSIS — R350 Frequency of micturition: Secondary | ICD-10-CM | POA: Diagnosis not present

## 2022-03-21 DIAGNOSIS — F321 Major depressive disorder, single episode, moderate: Secondary | ICD-10-CM | POA: Diagnosis not present

## 2022-03-21 DIAGNOSIS — M6289 Other specified disorders of muscle: Secondary | ICD-10-CM

## 2022-03-21 DIAGNOSIS — K5901 Slow transit constipation: Secondary | ICD-10-CM

## 2022-03-21 LAB — POCT URINALYSIS DIPSTICK
Bilirubin, UA: NEGATIVE
Blood, UA: NEGATIVE
Glucose, UA: NEGATIVE
Ketones, UA: NEGATIVE
Leukocytes, UA: NEGATIVE
Nitrite, UA: NEGATIVE
Protein, UA: NEGATIVE
Spec Grav, UA: 1.02 (ref 1.010–1.025)
Urobilinogen, UA: 0.2 E.U./dL
pH, UA: 6 (ref 5.0–8.0)

## 2022-03-21 LAB — POCT URINE PREGNANCY: Preg Test, Ur: NEGATIVE

## 2022-03-21 NOTE — Progress Notes (Signed)
Pt presents for routine STD testing. Pt reports LMP 01/25/22, and hasn't had a period since. Also having urinary frequency/urgency. No other concerns at this time.

## 2022-03-21 NOTE — Progress Notes (Signed)
ANNUAL EXAM Patient name: Michele Carney MRN [id number]  Date of birth: Dec 28, 2000 Chief Complaint:   Annual Exam  History of Present Illness:   Michele Carney is a 21 y.o. [id number] Hispanic female being seen today for a routine annual exam.   Current complaints: Missed Period, Pain with Intercourse, Increased Urinary Frequency x1 day, Constipation.  Patient's last menstrual period was 01/25/2022 (exact date). Cycles have been regular prior to this last cycle. Has not had a period in two months. Negative UPT at home and in office 2 weeks ago.   Has been experiencing increased pain with intercourse since delivery of daughter in 2021. Initiated Pelvic Floor PT in 2022, but had to stop due to unplanned circumstances with healthcare coverage.   Reports having to void x7 times yesterday evening within 2-3 hours after one bottle of water. Frequent, small voids.  No pain, leakage, blood in urine, odor or discharge.    The pregnancy intention screening data noted above was reviewed. Potential methods of contraception were discussed. The patient elected to proceed with No data recorded.   Last pap 2022. Results were: NILM w/ HRHPV not done. H/O abnormal pap: no Last mammogram: N/A. Results were: N/A. Family h/o breast cancer: no Last colonoscopy: N/A. Results were: N/A. Family h/o colorectal cancer: yes Maternal Grandfather     03/07/2022   11:44 AM 03/19/2021    8:29 AM 11/30/2020   12:00 PM 07/21/2019    3:24 PM  Depression screen PHQ 2/9  Decreased Interest 1 0 2 3  Down, Depressed, Hopeless 1 0 2 3  PHQ - 2 Score 2 0 4 6  Altered sleeping 0 0 3 2  Tired, decreased energy 2 0 2 3  Change in appetite 2 2 2 2   Feeling bad or failure about yourself  0 0 1 1  Trouble concentrating 1 0 3 0  Moving slowly or fidgety/restless 0 0 2 1  Suicidal thoughts 0 0 3 0  PHQ-9 Score 7 2 20 15   Difficult doing work/chores Somewhat difficult Not difficult at all Very difficult Not  difficult at all        03/07/2022   11:45 AM 11/30/2020   12:01 PM 07/21/2019    3:25 PM  GAD 7 : Generalized Anxiety Score  Nervous, Anxious, on Edge 2 3 3   Control/stop worrying 2 2 2   Worry too much - different things 2 3 2   Trouble relaxing 2 3 2   Restless 0 2 2  Easily annoyed or irritable 1 2 3   Afraid - awful might happen 3 3 3   Total GAD 7 Score 12 18 17   Anxiety Difficulty Somewhat difficult Very difficult Not difficult at all     Review of Systems:   Pertinent items are noted in HPI Denies any headaches, blurred vision, fatigue, shortness of breath, chest pain, abdominal pain, abnormal vaginal discharge/itching/odor/irritation, problems with periods, bowel movements, urination, or intercourse unless otherwise stated above. Pertinent History Reviewed:  Reviewed past medical,surgical, social and family history.  Reviewed problem list, medications and allergies. Physical Assessment:   Vitals:   03/21/22 0825  BP: 119/78  Pulse: (!) 101  Weight: 218 lb 3.2 oz (99 kg)  Height: 5\' 2"  (1.575 m)  Body mass index is 39.91 kg/m.        Physical Examination:   General appearance - well appearing, and in no distress  Mental status - alert, oriented to person, place, and time  Psych:  She has a  normal mood and affect  Skin - warm and dry, normal color, no suspicious lesions noted  Chest - effort normal, all lung fields clear to auscultation bilaterally  Heart - normal rate and regular rhythm  Neck:  midline trachea, no thyromegaly or nodules  Breasts - breasts appear normal, no suspicious masses  Abdomen - soft, nontender, nondistended, no masses or organomegaly  Pelvic - patient declined  Extremities:  No swelling or varicosities noted    Results for orders placed or performed in visit on 03/21/22 (from the past 24 hour(s))  POCT urinalysis dipstick   Collection Time: 03/21/22  8:49 AM  Result Value Ref Range   Color, UA Yellow    Clarity, UA Clear    Glucose,  UA Negative Negative   Bilirubin, UA Negative    Ketones, UA Negative    Spec Grav, UA 1.020 1.010 - 1.025   Blood, UA Negative    pH, UA 6.0 5.0 - 8.0   Protein, UA Negative Negative   Urobilinogen, UA 0.2 0.2 or 1.0 E.U./dL   Nitrite, UA Negative    Leukocytes, UA Negative Negative   Appearance     Odor    POCT urine pregnancy   Collection Time: 03/21/22  8:50 AM  Result Value Ref Range   Preg Test, Ur Negative Negative    Assessment & Plan:   1. Women's annual routine gynecological examination - Pap up-to-date, NILM 2022, Pap Due 02/2024 - Declines RPR, HIV, Hepatitis STI screening - Cervicovaginal ancillary only - POCT urinalysis dipstick - POCT urine pregnancy  2. Chronic migraine without aura without status migrainosus, not intractable - Stable - Recommended nightly Magnesium supplement  3. Current moderate episode of major depressive disorder, unspecified whether recurrent (HCC) - Stable, appropriate mood  4. Missed period - Discussed all the possible causes for this such as stress, hormonal dysregulation, anovulatory, insulin resistance, etc.  - Plan for patient to return to office if amenorrheic for x3 months.or missing/irregular periods continue for 6 months - 1 year. - POCT urine pregnancy  5. Slow transit constipation - Recommended increase water and fiber intake.  - Discussed OTC options such as Colace, Magnesium Supplementation, and/or Smooth Move Tea  6. Pelvic floor dysfunction in female 7. Dyspareunia in female - Recommended increased lubrication with intercourse and relaxation techniques - Ambulatory referral to Physical Therapy- Plan for patient to restart Pelvic Floor therapy  8. Urinary frequency - Likely incomplete emptying due to pelvic floor dysfunction and/or constipation. Does not experience urge or stress incontinence. - Cervicovaginal ancillary only - POCT urinalysis dipstick - WDL - Ambulatory referral to Physical Therapy - Pelvic Floor      Labs/procedures today: Cervicovaginal Swab self collected  Mammogram: @ 22yo, or sooner if problems Colonoscopy: @ 22yo, or sooner if problems  Orders Placed This Encounter  Procedures   Ambulatory referral to Physical Therapy   POCT urinalysis dipstick   POCT urine pregnancy    Meds: No orders of the defined types were placed in this encounter.   Follow-up: Return if symptoms worsen or fail to improve.  Corlis Hove, NP 03/21/2022 9:18 AM

## 2022-03-24 NOTE — Therapy (Incomplete)
OUTPATIENT PHYSICAL THERAPY THORACOLUMBAR EVALUATION   Patient Name: Michele Carney MRN: [id number] DOB:[birth year], 21 y.o., female Today's Date: 03/24/2022    Past Medical History:  Diagnosis Date   Anxiety    Depression    Past Surgical History:  Procedure Laterality Date   CESAREAN SECTION N/A 02/08/2020   Procedure: CESAREAN SECTION;  Surgeon: Lazaro Arms, MD;  Location: [facility] LD ORS;  Service: Obstetrics;  Laterality: N/A;   Patient Active Problem List   Diagnosis Date Noted   Well adult exam 03/07/2022   Acute maxillary sinusitis 03/07/2022   Chronic bilateral low back pain without sciatica 11/30/2020   Iron deficiency anemia 02/10/2020   Cesarean delivery delivered 02/08/2020   Non-reactive NST (non-stress test) 02/06/2020   Significant discrepancy between uterine size and clinical dates, antepartum, second trimester 11/16/2019   Yeast vaginitis 10/14/2019   GBS (group B streptococcus) UTI complicating pregnancy 08/31/2019   Supervision of normal first pregnancy, antepartum 07/05/2019   MDD (major depressive disorder) 05/25/2019   Chronic migraine without aura without status migrainosus, not intractable 12/30/2017    PCP: Everrett Coombe, DO   REFERRING PROVIDER: Everrett Coombe, DO   REFERRING DIAG: [phone] (ICD-10-CM) - Chronic bilateral low back pain without sciatica   Rationale for Evaluation and Treatment: Rehabilitation  THERAPY DIAG:  No diagnosis found.  ONSET DATE: ***  SUBJECTIVE:                                                                                                                                                                                           SUBJECTIVE STATEMENT: *** Pt reports limitations in sitting/standing/walking of ***/***/***.   PERTINENT HISTORY:  CSECTION 2021, anxiety, depression  PAIN:  Are you having pain? Yes: NPRS scale: ***/10 Pain location: *** Pain description: *** Aggravating factors:  *** Relieving factors: ***  PRECAUTIONS: None  WEIGHT BEARING RESTRICTIONS: No  FALLS:  Has patient fallen in last 6 months? {fallsyesno:27318}  LIVING ENVIRONMENT: Lives with: {[facility] lives with:25569::"lives with their family"} Lives in: {Lives in:25570} Stairs: {opstairs:27293} Has following equipment at home: {Assistive devices:23999}  OCCUPATION: ***  PLOF: Independent  PATIENT GOALS: ***  NEXT MD VISIT:   OBJECTIVE:   DIAGNOSTIC FINDINGS:  Xray - Alignment is normal. Intervertebral disc spaces are maintained.  PATIENT SURVEYS:  {rehab surveys:24030}  SCREENING FOR RED FLAGS: Bowel or bladder incontinence: No Spinal tumors: No Cauda equina syndrome: No Compression fracture: No Abdominal aneurysm: No  COGNITION: Overall cognitive status: Within functional limits for tasks assessed     SENSATION: {sensation:27233}  MUSCLE LENGTH: HS: Quads: ITB: Piriformis: Hip Flexors: Heelcords:   POSTURE: {posture:25561}  PALPATION: Palpation:  TTP at ***. Increased tissue tension in *** Spinal Mobility: Patellar Mobility:     LUMBAR ROM:   AROM eval  Flexion   Extension   Right lateral flexion   Left lateral flexion   Right rotation   Left rotation    (Blank rows = not tested)  LOWER EXTREMITY ROM:     Active  Right eval Left eval  Hip flexion    Hip extension    Hip abduction    Hip adduction    Hip internal rotation    Hip external rotation    Knee flexion    Knee extension    Ankle dorsiflexion    Ankle plantarflexion    Ankle inversion    Ankle eversion     (Blank rows = not tested)  LOWER EXTREMITY MMT:    MMT Right eval Left eval  Hip flexion    Hip extension    Hip abduction    Hip adduction    Hip internal rotation    Hip external rotation    Knee flexion    Knee extension    Ankle dorsiflexion    Ankle plantarflexion    Ankle inversion    Ankle eversion     (Blank rows = not tested)  LUMBAR SPECIAL TESTS:   {lumbar special test:25242}  FUNCTIONAL TESTS:  {Functional tests:24029}  GAIT: Distance walked: *** Assistive device utilized: {Assistive devices:23999} Level of assistance: {Levels of assistance:24026} Comments: ***  TODAY'S TREATMENT:                                                                                                                              DATE:   03/25/22  See Pt ed  PATIENT EDUCATION:  Education details: PT eval findings, anticipated POC, and ***  Person educated: Patient Education method: Explanation Education comprehension: verbalized understanding  HOME EXERCISE PROGRAM: TBD  ASSESSMENT:  CLINICAL IMPRESSION: Michele Carney  is a 21 y.o. female who was seen today for physical therapy evaluation and treatment for chronic LBP. ***  OBJECTIVE IMPAIRMENTS: {opptimpairments:25111}.   ACTIVITY LIMITATIONS: {activitylimitations:27494}  PARTICIPATION LIMITATIONS: {participationrestrictions:25113}  PERSONAL FACTORS: {Personal factors:25162} are also affecting patient's functional outcome.   REHAB POTENTIAL: Excellent  CLINICAL DECISION MAKING: Stable/uncomplicated  EVALUATION COMPLEXITY: Low   GOALS: Goals reviewed with patient? Yes  SHORT TERM GOALS: Target date: {follow up:25551} (Remove Blue Hyperlink)  Patient will be independent with initial HEP.  Baseline: no HEP Goal status: INITIAL  2.  *** Baseline: *** Goal status: INITIAL   LONG TERM GOALS: Target date: {follow up:25551}  (Remove Blue Hyperlink)  Patient will be independent with advanced/ongoing HEP to improve outcomes and carryover.  Baseline: no HEP Goal status: INITIAL  2.  Patient will report 75% improvement in low back pain to improve QOL.  Baseline: *** Goal status: INITIAL  3.  Patient will demonstrate full pain free lumbar ROM to perform ADLs.   Baseline: *** Goal status: INITIAL  4.  Patient will demonstrate improved functional strength as  demonstrated by ***. Baseline: *** Goal status: INITIAL  5.  Patient will report *** on lumbar FOTO to demonstrate improved functional ability.  Baseline: *** Goal status: INITIAL   6.  Patient will tolerate *** min of (standing/sitting/walking) to perform ***. Baseline: *** Goal status: INITIAL  7.  Patient to demonstrate ability to achieve and maintain good spinal alignment/posturing and body mechanics needed for daily activities. Baseline: *** Goal status: INITIAL  8. Patient will *** Baseline: *** Goal status: INITIAL   PLAN:  PT FREQUENCY: {rehab frequency:25116}  PT DURATION: {rehab duration:25117}  PLANNED INTERVENTIONS: Therapeutic exercises, Therapeutic activity, Neuromuscular re-education, Patient/Family education, Self Care, Joint mobilization, Aquatic Therapy, Dry Needling, Spinal manipulation, Spinal mobilization, Cryotherapy, Moist heat, Taping, Traction, Manual therapy, and ***. (HEALTHY BLUE) *** PLAN FOR NEXT SESSION: ***   Truong Delcastillo, PT 03/24/2022, 7:10 PM

## 2022-03-25 ENCOUNTER — Ambulatory Visit: Payer: Medicaid Other | Admitting: Physical Therapy

## 2022-03-25 LAB — CERVICOVAGINAL ANCILLARY ONLY
Chlamydia: NEGATIVE
Comment: NEGATIVE
Comment: NEGATIVE
Comment: NORMAL
Neisseria Gonorrhea: NEGATIVE
Trichomonas: NEGATIVE

## 2022-04-15 NOTE — Therapy (Signed)
OUTPATIENT PHYSICAL THERAPY FEMALE PELVIC EVALUATION   Patient Name: Michele Carney MRN: [id number] DOB:Oct 02, 2000, 21 y.o., female Today's Date: 04/16/2022  END OF SESSION:  PT End of Session - 04/16/22 0927     Visit Number 1    Date for PT Re-Evaluation 07/09/22    Authorization Type [employer] [place]    PT Start Time [phone]    PT Stop Time 1010    PT Time Calculation (min) 42 min    Activity Tolerance Patient tolerated treatment well    Behavior During Therapy [facility] for tasks assessed/performed             Past Medical History:  Diagnosis Date   Anxiety    Depression    Past Surgical History:  Procedure Laterality Date   CESAREAN SECTION N/A 02/08/2020   Procedure: CESAREAN SECTION;  Surgeon: Lazaro Arms, MD;  Location: [facility] LD ORS;  Service: Obstetrics;  Laterality: N/A;   Patient Active Problem List   Diagnosis Date Noted   Well adult exam 03/07/2022   Acute maxillary sinusitis 03/07/2022   Chronic bilateral low back pain without sciatica 11/30/2020   Iron deficiency anemia 02/10/2020   Cesarean delivery delivered 02/08/2020   Non-reactive NST (non-stress test) 02/06/2020   Significant discrepancy between uterine size and clinical dates, antepartum, second trimester 11/16/2019   Yeast vaginitis 10/14/2019   GBS (group B streptococcus) UTI complicating pregnancy 08/31/2019   Supervision of normal first pregnancy, antepartum 07/05/2019   MDD (major depressive disorder) 05/25/2019   Chronic migraine without aura without status migrainosus, not intractable 12/30/2017    PCP: Everrett Coombe, DO  REFERRING PROVIDER: Corlis Hove, NP  REFERRING DIAG: M62.89 (ICD-10-CM) - Pelvic floor dysfunction in female N94.10 (ICD-10-CM) - Dyspareunia in female R35.0 (ICD-10-CM) - Urinary frequency  THERAPY DIAG:  Abnormal posture  Muscle weakness (generalized)  Other muscle spasm  Unspecified lack of coordination  Other low back pain  Pelvic  pain  Rationale for Evaluation and Treatment: Rehabilitation  ONSET DATE: 02/08/20  SUBJECTIVE:                                                                                                                                                                                           04/16/22 SUBJECTIVE STATEMENT: Pt states that low back pain started 2 years ago after having daughter and it seems to get worse around her cycle. She has had pain with intercourse that started around this time as well - been consistently bad since she had a yeast infection this past May. She will feel lower abdominal pain during intercourse. She will have sciatica on Lt side.  Fluid intake: Yes: drinks over 100oz of water a day    PAIN:  Are you having pain? Yes NPRS scale: 8/10 low back;  Pain location:  low back pain  Pain type: dull Pain description: aching   Aggravating factors: any activity, menstrual cycle, flexion Relieving factors: epsom salt baths, icy hot  PRECAUTIONS: None  WEIGHT BEARING RESTRICTIONS: No  FALLS:  Has patient fallen in last 6 months? 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  PLOF: Independent  PATIENT GOALS: decrease back pain and pain free intercourse; reduce tightness  PERTINENT HISTORY:  C/section Sexual abuse: No  BOWEL MOVEMENT: Pain with bowel movement: Yes - pain around the anus and in abdomen Type of bowel movement:Type (Bristol Stool Scale) 3 - varies in consistency, Frequency at least 1x/day, and Strain Yes - she's trying to strain less, uses squatty potty Fully empty rectum: No Leakage: No Pads: No Fiber supplement: Noworking on eating more fiber  URINATION: Pain with urination: Yes - sometimes; gyn has ruled out infection multiple times  Fully empty bladder: No Stream: Strong Urgency: Yes: immediately or will  leak Frequency: 2-3x/hour Leakage: Urge to void, Walking to the bathroom, Laughing, and at night she will wake up wet (6-8x in the last month) Pads: No  INTERCOURSE: Pain with intercourse: Initial Penetration, During Penetration, and After Intercourse Ability to have vaginal penetration:  No Climax: normal Marinoff Scale: 0/3  PREGNANCY: Vaginal deliveries 0 Tearing No C-section deliveries 1 - she does have incisional pain and feels trauma surrounding delivery  Currently pregnant No  PROLAPSE: Feels like organs are trying to come out since delivery   OBJECTIVE:   PATIENT SURVEYS:   PFIQ-7 81  COGNITION: Overall cognitive status: Within functional limits for tasks assessed     SENSATION: Light touch: Appears intact Proprioception: Appears intact  MUSCLE LENGTH: Hip flexor: inc low back pain on Lt; WNL Piriformis: restricted bil, pressure in lower abdomen IR: painful on Lt in groin/lower abdomen; WNL bil  FUNCTIONAL TESTS:   Squat: Large Rt weight shift (hurt Lt foot at work the other day)  Single leg stance: 10 sec bil, less stable on Lt  Stork: Lt stance with Rt pelvic drop   GAIT: Comments: WNL  POSTURE: forward head, increased lumbar lordosis, decreased thoracic kyphosis, anterior pelvic tilt, and Rt thoracic rotation (possible very mild Rt thoracic curvature)  LUMBARAROM/PROM:  A/PROM A/PROM  Eval (% available)  Flexion 100  Extension 50  Right lateral flexion 50  Left lateral flexion 50  Right rotation 50  Left rotation 50   (Blank rows = not tested)   LOWER EXTREMITY MMT:  MMT Right eval Left eval  Hip flexion 4 4  Hip extension 3 3  Hip abduction 4- 4-  Hip adduction 4- 4-  Hip internal rotation 4- 4-  Hip external rotation 4- 4-  Knee flexion    Knee extension    Ankle dorsiflexion    Ankle plantarflexion    Ankle inversion    Ankle eversion    *all reproduced low back pain; abd/adduction, ext, and bil hip rotation testing  reproduced bil groin/pelvic pain PALPATION:   General  -1 finger width separation at umbilicus with distortion  -tenderness directly over Rt SIJ with surrounding muscle tension  -bil lumbar paraspinal tightness and tenderness from T12-L5  External Perineal Exam NA                             Internal Pelvic Floor NA  Patient confirms identification and approves PT to assess internal pelvic floor and treatment Yes - future treatment session   PELVIC MMT:   MMT eval  Vaginal   Internal Anal Sphincter   External Anal Sphincter   Puborectalis   Diastasis Recti   (Blank rows = not tested)        TONE: NA  PROLAPSE: NA  TODAY'S TREATMENT:                                                                                                                              DATE: 04/16/22  EVAL  Neuromuscular re-education: Core retraining:  Transversus abdominus training with multimodal cues for improved motor control and breath coordination Down training: Diaphragmatic breathing Cat/cow 2 x 10 Child's pose 10 breaths Check all possible CPT codes: [id number]- Neuro Re-education    Check all conditions that are expected to impact treatment: Current pregnancy or recent postpartum   If treatment provided at initial evaluation, no treatment charged due to lack of authorization.         PATIENT EDUCATION:  Education details: education on connection between core, pelvic floor, and whole body with current dysfunction Person educated: Patient Education method: Explanation, Demonstration, Tactile cues, Verbal cues, and Handouts Education comprehension: verbalized understanding  HOME EXERCISE PROGRAM: [id number]  ASSESSMENT:  CLINICAL IMPRESSION: Patient is a 21 y.o. female who was seen today for physical therapy evaluation and treatment for low back/pelvic pain, dyspareunia, urinary urgency/incontinence, and painful bowel movements. Exam findings focused on external  measurements today and notable for decreased lumbar A/ROM, abnormal posture, (+) stork test on Lt with Rt pelvic drop, mild diastasis with doming with increased pressure, tenderness directly over Lt SIJ, tenderness and tension throughout lumbar paraspinals and Rt posterolateral hip mm, c-section incision restriction and lower abdominal scar tissue, tenderness inferior to umbilicus and throughout lower abdomen, bil hip weakness, and abnormal squat mechanics; internal vaginal assessment not performed this session, but due to extensive subjective exam believe that high tone pelvic floor is highly likely. Signs and symptoms are most consistent with complete core weakness and lumbopelvic instability that has lead to abnormal overactivity in core musculature, including pelvic floor. Initial treatment consisted of diaphragmatic breathing and down training exercises to help improve mobility and muscle relaxation; we discussed improved awareness of muscle guarding/bracing. She will benefit from skilled PT intervention in order to decrease pain, address impairments, return to intercourse without pain, and improve bowel/bladder function in order to improve QOL.   OBJECTIVE IMPAIRMENTS: decreased activity tolerance, decreased coordination, decreased endurance, decreased strength, increased fascial restrictions, increased muscle spasms, impaired tone, postural dysfunction, and pain.   ACTIVITY LIMITATIONS: standing, squatting, sleeping, continence, and locomotion level  PARTICIPATION LIMITATIONS: interpersonal relationship, community activity, occupation, and  school  PERSONAL FACTORS: 1 comorbidity: c/section  are also affecting patient's functional outcome.   REHAB POTENTIAL: Good  CLINICAL DECISION MAKING: Stable/uncomplicated  EVALUATION COMPLEXITY: Low   GOALS: Goals reviewed with patient? Yes  SHORT TERM GOALS: Target date: 05/14/22  Pt will be independent with HEP.   Baseline: Goal status:  INITIAL  2.  Pt will be able to correctly perform diaphragmatic breathing and appropriate pressure management in order to prevent worsening vaginal wall laxity and improve pelvic floor A/ROM.   Baseline:  Goal status: INITIAL  3.  Pt will be independent with diaphragmatic breathing and down training activities in order to improve pelvic floor relaxation.  Baseline:  Goal status: INITIAL  4.  Pt will be independent with the knack, urge suppression technique, and double voiding in order to improve bladder habits and decrease urinary incontinence.   Baseline:  Goal status: INITIAL  5.  Pt will be independent with use of squatty potty, relaxed toileting mechanics, and improved bowel movement techniques in order to increase ease of bowel movements and complete evacuation.   Baseline:  Goal status: INITIAL    LONG TERM GOALS: Target date: 07/09/2022  Pt will be independent with advanced HEP.   Baseline:  Goal status: INITIAL  2.  Pt will demonstrate normal pelvic floor muscle tone and A/ROM, able to achieve 4/5 strength with contractions and 10 sec endurance, in order to provide appropriate lumbopelvic support in functional activities.   Baseline:  Goal status: INITIAL  3.  Pt will be able to go 2-3 hours in between voids without urgency or incontinence in order to improve QOL and perform all functional activities with less difficulty.   Baseline:  Goal status: INITIAL  4.  Pt will have regular bowel movement with normal consistent, no straining, and no pain.  Baseline:  Goal status: INITIAL  5.  Pt will report 0/10 pain with vaginal penetration in order to improve intimate relationship with partner.    Baseline:  Goal status: INITIAL  6.  Pt will demonstrate increase in all impaired hip strength by 1 muscle grades and bil negative stork test in order to demonstrate improved lumbopelvic support and increase functional ability.   Baseline:  Goal status:  INITIAL  PLAN:  PT FREQUENCY: 1-2x/week  PT DURATION: 12 weeks  PLANNED INTERVENTIONS: Therapeutic exercises, Therapeutic activity, Neuromuscular re-education, Balance training, Gait training, Patient/Family education, Self Care, Joint mobilization, Dry Needling, Biofeedback, and Manual therapy  PLAN FOR NEXT SESSION: possible internal vaginal pelvic floor assessment; continue core stability training; progress down training.    Julio Alm, PT, DPT11/28/2310:54 AM

## 2022-04-16 ENCOUNTER — Ambulatory Visit: Payer: Medicaid Other | Attending: Student

## 2022-04-16 DIAGNOSIS — M5459 Other low back pain: Secondary | ICD-10-CM | POA: Diagnosis present

## 2022-04-16 DIAGNOSIS — M62838 Other muscle spasm: Secondary | ICD-10-CM | POA: Diagnosis present

## 2022-04-16 DIAGNOSIS — R293 Abnormal posture: Secondary | ICD-10-CM | POA: Insufficient documentation

## 2022-04-16 DIAGNOSIS — R279 Unspecified lack of coordination: Secondary | ICD-10-CM | POA: Insufficient documentation

## 2022-04-16 DIAGNOSIS — R102 Pelvic and perineal pain: Secondary | ICD-10-CM | POA: Diagnosis present

## 2022-04-16 DIAGNOSIS — M6289 Other specified disorders of muscle: Secondary | ICD-10-CM | POA: Diagnosis not present

## 2022-04-16 DIAGNOSIS — M6281 Muscle weakness (generalized): Secondary | ICD-10-CM | POA: Insufficient documentation

## 2022-04-16 DIAGNOSIS — N941 Unspecified dyspareunia: Secondary | ICD-10-CM | POA: Insufficient documentation

## 2022-04-16 DIAGNOSIS — R350 Frequency of micturition: Secondary | ICD-10-CM | POA: Diagnosis not present

## 2022-05-01 ENCOUNTER — Ambulatory Visit: Payer: Medicaid Other | Attending: Student | Admitting: Physical Therapy

## 2022-05-01 ENCOUNTER — Encounter: Payer: Self-pay | Admitting: Physical Therapy

## 2022-05-01 DIAGNOSIS — M62838 Other muscle spasm: Secondary | ICD-10-CM | POA: Insufficient documentation

## 2022-05-01 DIAGNOSIS — R293 Abnormal posture: Secondary | ICD-10-CM | POA: Diagnosis present

## 2022-05-01 DIAGNOSIS — R279 Unspecified lack of coordination: Secondary | ICD-10-CM | POA: Diagnosis present

## 2022-05-01 DIAGNOSIS — M5459 Other low back pain: Secondary | ICD-10-CM | POA: Insufficient documentation

## 2022-05-01 DIAGNOSIS — M6281 Muscle weakness (generalized): Secondary | ICD-10-CM | POA: Diagnosis present

## 2022-05-01 NOTE — Patient Instructions (Signed)

## 2022-05-01 NOTE — Therapy (Signed)
OUTPATIENT PHYSICAL THERAPY TREATMENT NOTE   Patient Name: Michele Carney MRN: [id number] DOB:[birth year], 21 y.o., female Today's Date: 05/01/2022  PCP: Everrett Coombe, DO  REFERRING PROVIDER: Corlis Hove, NP   END OF SESSION:   PT End of Session - 05/01/22 0937     Visit Number 2    Date for PT Re-Evaluation 07/09/22    Authorization Type [employer]    Authorization Time Period 11/28-2/26    Authorization - Visit Number 1    Authorization - Number of Visits 13    PT Start Time 0930    PT Stop Time 1010    PT Time Calculation (min) 40 min    Activity Tolerance Patient tolerated treatment well    Behavior During Therapy WFL for tasks assessed/performed             Past Medical History:  Diagnosis Date   Anxiety    Depression    Past Surgical History:  Procedure Laterality Date   CESAREAN SECTION N/A 02/08/2020   Procedure: CESAREAN SECTION;  Surgeon: Lazaro Arms, MD;  Location: [facility] LD [facility];  Service: Obstetrics;  Laterality: N/A;   Patient Active Problem List   Diagnosis Date Noted   Well adult exam 03/07/2022   Acute maxillary sinusitis 03/07/2022   Chronic bilateral low back pain without sciatica 11/30/2020   Iron deficiency anemia 02/10/2020   Cesarean delivery delivered 02/08/2020   Non-reactive NST (non-stress test) 02/06/2020   Significant discrepancy between uterine size and clinical dates, antepartum, second trimester 11/16/2019   Yeast vaginitis 10/14/2019   GBS (group B streptococcus) UTI complicating pregnancy 08/31/2019   Supervision of normal first pregnancy, antepartum 07/05/2019   MDD (major depressive disorder) 05/25/2019   Chronic migraine without aura without status migrainosus, not intractable 12/30/2017   REFERRING DIAG: M62.89 (ICD-10-CM) - Pelvic floor dysfunction in female N94.10 (ICD-10-CM) - Dyspareunia in female R35.0 (ICD-10-CM) - Urinary frequency   THERAPY DIAG:  Abnormal posture   Muscle weakness  (generalized)   Other muscle spasm   Unspecified lack of coordination   Other low back pain   Pelvic pain   Rationale for Evaluation and Treatment: Rehabilitation   ONSET DATE: 02/08/20   SUBJECTIVE:                                                                                                                                                                                            04/16/22 SUBJECTIVE STATEMENT: Patient reports she is the same from the initial evaluation.  Fluid intake: Yes: drinks over 100oz of water a day     PAIN:  Are you having  pain? Yes NPRS scale: 8/10 low back;  Pain location:  low back pain   Pain type: dull Pain description: aching    Aggravating factors: any activity, menstrual cycle, flexion Relieving factors: epsom salt baths, icy hot   PRECAUTIONS: None   WEIGHT BEARING RESTRICTIONS: No   FALLS:  Has patient fallen in last 6 months? 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   PLOF: Independent   PATIENT GOALS: decrease back pain and pain free intercourse; reduce tightness   PERTINENT HISTORY:  C/section Sexual abuse: No   BOWEL MOVEMENT: Pain with bowel movement: Yes - pain around the anus and in abdomen, 6/10 Type of bowel movement:Type (Bristol Stool Scale) 3 - varies in consistency, Frequency at least 1x/day, and Strain Yes - she's trying to strain less, uses squatty potty Fully empty rectum: No Leakage: No Pads: No Fiber supplement: Noworking on eating more fiber   URINATION: Pain with urination: Yes - sometimes; gyn has ruled out infection multiple times , 6/10 Fully empty bladder: No Stream: Strong Urgency: Yes: immediately or will leak Frequency: 2-3x/hour Leakage: Urge to void, Walking to the bathroom, Laughing, and at night she will wake up wet (6-8x in the last  month) Pads: No   INTERCOURSE: Pain with intercourse: Initial Penetration, During Penetration, and After Intercourse Ability to have vaginal penetration:  No Climax: normal Marinoff Scale: 0/3   PREGNANCY: Vaginal deliveries 0 Tearing No C-section deliveries 1 - she does have incisional pain and feels trauma surrounding delivery  Currently pregnant No   PROLAPSE: Feels like organs are trying to come out since delivery   OBJECTIVE: (objective measures completed at initial evaluation unless otherwise dated)    PATIENT SURVEYS:    PFIQ-7 81   COGNITION: Overall cognitive status: Within functional limits for tasks assessed                          SENSATION: Light touch: Appears intact Proprioception: Appears intact   MUSCLE LENGTH: Hip flexor: inc low back pain on Lt; WNL Piriformis: restricted bil, pressure in lower abdomen IR: painful on Lt in groin/lower abdomen; WNL bil   FUNCTIONAL TESTS:              Squat: Large Rt weight shift (hurt Lt foot at work the other day)             Single leg stance: 10 sec bil, less stable on Lt             Stork: Lt stance with Rt pelvic drop              GAIT: Comments: WNL   POSTURE: forward head, increased lumbar lordosis, decreased thoracic kyphosis, anterior pelvic tilt, and Rt thoracic rotation (possible very mild Rt thoracic curvature)   LUMBARAROM/PROM:   A/PROM A/PROM  Eval (% available)  Flexion 100  Extension 50  Right lateral flexion 50  Left lateral flexion 50  Right rotation 50  Left rotation 50   (Blank rows = not tested)     LOWER EXTREMITY MMT:   MMT Right eval Left eval  Hip flexion 4 4  Hip extension 3 3  Hip abduction 4- 4-  Hip adduction 4- 4-  Hip internal rotation 4- 4-  Hip external rotation 4- 4-  Knee flexion  Knee extension      Ankle dorsiflexion      Ankle plantarflexion      Ankle inversion      Ankle eversion      *all reproduced low back pain; abd/adduction, ext, and bil  hip rotation testing reproduced bil groin/pelvic pain PALPATION:   General  -1 finger width separation at umbilicus with distortion             -tenderness directly over Rt SIJ with surrounding muscle tension             -bil lumbar paraspinal tightness and tenderness from T12-L5                 External Perineal Exam NA                             Internal Pelvic Floor NA   Patient confirms identification and approves PT to assess internal pelvic floor and treatment Yes - future treatment session    PELVIC MMT:   MMT eval  Vaginal    Internal Anal Sphincter    External Anal Sphincter    Puborectalis    Diastasis Recti    (Blank rows = not tested)         TONE: NA   PROLAPSE: NA   TODAY'S TREATMENT:     05/01/2022 Manual: Soft tissue mobilization: To assess for dry needling Manual work to the lumbar paraspinals, right quadratus, right gluteals Scar tissue mobilization: To abdominal c-section scar going through the restrictions Educated patient on how to massage the c-section scar Myofascial release: Quadruped pulling the tissue from the back to the abdomen and gently releasing the abdominal tissue Quadruped with hands in the lower abdomen releasing the tissue as she rocks back and forth and diagonals Trigger Point Dry-Needling  Treatment instructions: Expect mild to moderate muscle soreness. S/S of pneumothorax if dry needled over a lung field, and to seek immediate medical attention should they occur. Patient verbalized understanding of these instructions and education.  Patient Consent Given: Yes Education handout provided: Yes Muscles treated: lumbar multifidi Electrical stimulation performed: No Parameters: N/A Treatment response/outcome: elongation of muscle and trigger point response  Exercises: Stretches/mobility: Cat/cow 2 x 10 Child's pose 10 breaths Strengthening: Tried the recumbent bike but caused pain in the lower abdomen.                                                                                                                               DATE: 04/16/22  EVAL  Neuromuscular re-education: Core retraining:  Transversus abdominus training with multimodal cues for improved motor control and breath coordination Down training: Diaphragmatic breathing Cat/cow 2 x 10 Child's pose 10 breaths Check all possible CPT codes: 42353- Neuro Re-education                Check all conditions that are expected to impact treatment: Current  pregnancy or recent postpartum         If treatment provided at initial evaluation, no treatment charged due to lack of authorization.                                    PATIENT EDUCATION:  Education details: information on dry needling, scar massage Person educated: Patient Education method: Explanation, Demonstration, Tactile cues, Verbal cues, and Handouts Education comprehension: verbalized understanding   HOME EXERCISE PROGRAM: [id number]   ASSESSMENT:   CLINICAL IMPRESSION: Patient is a 21 y.o. female who was seen today for physical therapy  treatment for low back/pelvic pain, dyspareunia, urinary urgency/incontinence, and painful bowel movements. Patient had trigger points in the lumbar and some referred to the abdominals. Patient responded well to the dry needling. She has restrictions in the lower abdomen and along the c-sections scar She understands how to perform manual work to the scar.  She will benefit from skilled PT intervention in order to decrease pain, address impairments, return to intercourse without pain, and improve bowel/bladder function in order to improve QOL.    OBJECTIVE IMPAIRMENTS: decreased activity tolerance, decreased coordination, decreased endurance, decreased strength, increased fascial restrictions, increased muscle spasms, impaired tone, postural dysfunction, and pain.    ACTIVITY LIMITATIONS: standing, squatting, sleeping, continence, and locomotion level    PARTICIPATION LIMITATIONS: interpersonal relationship, community activity, occupation, and school   PERSONAL FACTORS: 1 comorbidity: c/section  are also affecting patient's functional outcome.    REHAB POTENTIAL: Good   CLINICAL DECISION MAKING: Stable/uncomplicated   EVALUATION COMPLEXITY: Low     GOALS: Goals reviewed with patient? Yes   SHORT TERM GOALS: Target date: 05/14/22   Pt will be independent with HEP.    Baseline: Goal status: INITIAL   2.  Pt will be able to correctly perform diaphragmatic breathing and appropriate pressure management in order to prevent worsening vaginal wall laxity and improve pelvic floor A/ROM.    Baseline:  Goal status: INITIAL   3.  Pt will be independent with diaphragmatic breathing and down training activities in order to improve pelvic floor relaxation.   Baseline:  Goal status: INITIAL   4.  Pt will be independent with the knack, urge suppression technique, and double voiding in order to improve bladder habits and decrease urinary incontinence.    Baseline:  Goal status: INITIAL   5.  Pt will be independent with use of squatty potty, relaxed toileting mechanics, and improved bowel movement techniques in order to increase ease of bowel movements and complete evacuation.    Baseline:  Goal status: INITIAL       LONG TERM GOALS: Target date: 07/09/2022   Pt will be independent with advanced HEP.    Baseline:  Goal status: INITIAL   2.  Pt will demonstrate normal pelvic floor muscle tone and A/ROM, able to achieve 4/5 strength with contractions and 10 sec endurance, in order to provide appropriate lumbopelvic support in functional activities.    Baseline:  Goal status: INITIAL   3.  Pt will be able to go 2-3 hours in between voids without urgency or incontinence in order to improve QOL and perform all functional activities with less difficulty.    Baseline:  Goal status: INITIAL   4.  Pt will have regular bowel movement  with normal consistent, no straining, and no pain.  Baseline:  Goal status: INITIAL   5.  Pt will report 0/10  pain with vaginal penetration in order to improve intimate relationship with partner.     Baseline:  Goal status: INITIAL   6.  Pt will demonstrate increase in all impaired hip strength by 1 muscle grades and bil negative stork test in order to demonstrate improved lumbopelvic support and increase functional ability.    Baseline:  Goal status: INITIAL   PLAN:   PT FREQUENCY: 1-2x/week   PT DURATION: 12 weeks   PLANNED INTERVENTIONS: Therapeutic exercises, Therapeutic activity, Neuromuscular re-education, Balance training, Gait training, Patient/Family education, Self Care, Joint mobilization, Dry Needling, Biofeedback, and Manual therapy   PLAN FOR NEXT SESSION: possible internal vaginal pelvic floor assessment; See how dry needling went and maybe do some on the right lumbar and quadratus. continue core stability training; progress down training.    Eulis Foster, PT 05/01/22 10:23 AM

## 2022-05-09 ENCOUNTER — Ambulatory Visit: Payer: Medicaid Other

## 2022-05-09 NOTE — Therapy (Incomplete)
OUTPATIENT PHYSICAL THERAPY TREATMENT NOTE   Patient Name: Michele Carney MRN: [id number] DOB:[birth year], 21 y.o., female Today's Date: 05/09/2022  PCP: Everrett Coombe, DO  REFERRING PROVIDER: Corlis Hove, NP   END OF SESSION:     Past Medical History:  Diagnosis Date   Anxiety    Depression    Past Surgical History:  Procedure Laterality Date   CESAREAN SECTION N/A 02/08/2020   Procedure: CESAREAN SECTION;  Surgeon: Lazaro Arms, MD;  Location: [facility] LD [facility]S;  Service: Obstetrics;  Laterality: N/A;   Patient Active Problem List   Diagnosis Date Noted   Well adult exam 03/07/2022   Acute maxillary sinusitis 03/07/2022   Chronic bilateral low back pain without sciatica 11/30/2020   Iron deficiency anemia 02/10/2020   Cesarean delivery delivered 02/08/2020   Non-reactive NST (non-stress test) 02/06/2020   Significant discrepancy between uterine size and clinical dates, antepartum, second trimester 11/16/2019   Yeast vaginitis 10/14/2019   GBS (group B streptococcus) UTI complicating pregnancy 08/31/2019   Supervision of normal first pregnancy, antepartum 07/05/2019   MDD (major depressive disorder) 05/25/2019   Chronic migraine without aura without status migrainosus, not intractable 12/30/2017   REFERRING DIAG: M62.89 (ICD-10-CM) - Pelvic floor dysfunction in female N94.10 (ICD-10-CM) - Dyspareunia in female R35.0 (ICD-10-CM) - Urinary frequency   THERAPY DIAG:  Abnormal posture   Muscle weakness (generalized)   Other muscle spasm   Unspecified lack of coordination   Other low back pain   Pelvic pain   Rationale for Evaluation and Treatment: Rehabilitation   ONSET DATE: 02/08/20   SUBJECTIVE:                                                                                                                                                                                            04/16/22 SUBJECTIVE STATEMENT: Patient reports she is the same from the  initial evaluation.  Fluid intake: Yes: drinks over 100oz of water a day     PAIN:  Are you having pain? Yes NPRS scale: 8/10 low back;  Pain location:  low back pain   Pain type: dull Pain description: aching    Aggravating factors: any activity, menstrual cycle, flexion Relieving factors: epsom salt baths, icy hot   PRECAUTIONS: None   WEIGHT BEARING RESTRICTIONS: No   FALLS:  Has patient fallen in last 6 months? 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   PLOF: Independent   PATIENT GOALS: decrease back pain and pain free intercourse;  reduce tightness   PERTINENT HISTORY:  C/section Sexual abuse: No   BOWEL MOVEMENT: Pain with bowel movement: Yes - pain around the anus and in abdomen, 6/10 Type of bowel movement:Type (Bristol Stool Scale) 3 - varies in consistency, Frequency at least 1x/day, and Strain Yes - she's trying to strain less, uses squatty potty Fully empty rectum: No Leakage: No Pads: No Fiber supplement: Noworking on eating more fiber   URINATION: Pain with urination: Yes - sometimes; gyn has ruled out infection multiple times , 6/10 Fully empty bladder: No Stream: Strong Urgency: Yes: immediately or will leak Frequency: 2-3x/hour Leakage: Urge to void, Walking to the bathroom, Laughing, and at night she will wake up wet (6-8x in the last month) Pads: No   INTERCOURSE: Pain with intercourse: Initial Penetration, During Penetration, and After Intercourse Ability to have vaginal penetration:  No Climax: normal Marinoff Scale: 0/3   PREGNANCY: Vaginal deliveries 0 Tearing No C-section deliveries 1 - she does have incisional pain and feels trauma surrounding delivery  Currently pregnant No   PROLAPSE: Feels like organs are trying to come out since delivery   OBJECTIVE: (objective measures completed  at initial evaluation unless otherwise dated)    PATIENT SURVEYS:    PFIQ-7 81   COGNITION: Overall cognitive status: Within functional limits for tasks assessed                          SENSATION: Light touch: Appears intact Proprioception: Appears intact   MUSCLE LENGTH: Hip flexor: inc low back pain on Lt; WNL Piriformis: restricted bil, pressure in lower abdomen IR: painful on Lt in groin/lower abdomen; WNL bil   FUNCTIONAL TESTS:              Squat: Large Rt weight shift (hurt Lt foot at work the other day)             Single leg stance: 10 sec bil, less stable on Lt             Stork: Lt stance with Rt pelvic drop              GAIT: Comments: WNL   POSTURE: forward head, increased lumbar lordosis, decreased thoracic kyphosis, anterior pelvic tilt, and Rt thoracic rotation (possible very mild Rt thoracic curvature)   LUMBARAROM/PROM:   A/PROM A/PROM  Eval (% available)  Flexion 100  Extension 50  Right lateral flexion 50  Left lateral flexion 50  Right rotation 50  Left rotation 50   (Blank rows = not tested)     LOWER EXTREMITY MMT:   MMT Right eval Left eval  Hip flexion 4 4  Hip extension 3 3  Hip abduction 4- 4-  Hip adduction 4- 4-  Hip internal rotation 4- 4-  Hip external rotation 4- 4-  Knee flexion      Knee extension      Ankle dorsiflexion      Ankle plantarflexion      Ankle inversion      Ankle eversion      *all reproduced low back pain; abd/adduction, ext, and bil hip rotation testing reproduced bil groin/pelvic pain PALPATION:   General  -1 finger width separation at umbilicus with distortion             -tenderness directly over Rt SIJ with surrounding muscle tension             -bil lumbar paraspinal tightness and tenderness from  T12-L5                 External Perineal Exam NA                             Internal Pelvic Floor NA   Patient confirms identification and approves PT to assess internal pelvic floor and treatment  Yes - future treatment session    PELVIC MMT:   MMT eval  Vaginal    Internal Anal Sphincter    External Anal Sphincter    Puborectalis    Diastasis Recti    (Blank rows = not tested)         TONE: NA   PROLAPSE: NA   TODAY'S TREATMENT 05/09/22: Manual:  Neuromuscular re-education:  Exercises:  Therapeutic activities:    TREATMENT:     05/01/2022 Manual: Soft tissue mobilization: To assess for dry needling Manual work to the lumbar paraspinals, right quadratus, right gluteals Scar tissue mobilization: To abdominal c-section scar going through the restrictions Educated patient on how to massage the c-section scar Myofascial release: Quadruped pulling the tissue from the back to the abdomen and gently releasing the abdominal tissue Quadruped with hands in the lower abdomen releasing the tissue as she rocks back and forth and diagonals Trigger Point Dry-Needling  Treatment instructions: Expect mild to moderate muscle soreness. S/S of pneumothorax if dry needled over a lung field, and to seek immediate medical attention should they occur. Patient verbalized understanding of these instructions and education.  Patient Consent Given: Yes Education handout provided: Yes Muscles treated: lumbar multifidi Electrical stimulation performed: No Parameters: N/A Treatment response/outcome: elongation of muscle and trigger point response  Exercises: Stretches/mobility: Cat/cow 2 x 10 Child's pose 10 breaths Strengthening: Tried the recumbent bike but caused pain in the lower abdomen.                                                                                                                              DATE: 04/16/22  EVAL  Neuromuscular re-education: Core retraining:  Transversus abdominus training with multimodal cues for improved motor control and breath coordination Down training: Diaphragmatic breathing Cat/cow 2 x 10 Child's pose 10 breaths Check all  possible CPT codes: 56389- Neuro Re-education                Check all conditions that are expected to impact treatment: Current pregnancy or recent postpartum         If treatment provided at initial evaluation, no treatment charged due to lack of authorization.                                    PATIENT EDUCATION:  Education details: information on dry needling, scar massage Person educated: Patient Education method: Explanation, Demonstration, Tactile cues, Verbal cues, and Handouts Education comprehension: verbalized understanding  HOME EXERCISE PROGRAM: [id number]   ASSESSMENT:   CLINICAL IMPRESSION: She will benefit from skilled PT intervention in order to decrease pain, address impairments, return to intercourse without pain, and improve bowel/bladder function in order to improve QOL.    OBJECTIVE IMPAIRMENTS: decreased activity tolerance, decreased coordination, decreased endurance, decreased strength, increased fascial restrictions, increased muscle spasms, impaired tone, postural dysfunction, and pain.    ACTIVITY LIMITATIONS: standing, squatting, sleeping, continence, and locomotion level   PARTICIPATION LIMITATIONS: interpersonal relationship, community activity, occupation, and school   PERSONAL FACTORS: 1 comorbidity: c/section  are also affecting patient's functional outcome.    REHAB POTENTIAL: Good   CLINICAL DECISION MAKING: Stable/uncomplicated   EVALUATION COMPLEXITY: Low     GOALS: Goals reviewed with patient? Yes   SHORT TERM GOALS: Target date: 05/14/22   Pt will be independent with HEP.    Baseline: Goal status: INITIAL   2.  Pt will be able to correctly perform diaphragmatic breathing and appropriate pressure management in order to prevent worsening vaginal wall laxity and improve pelvic floor A/ROM.    Baseline:  Goal status: INITIAL   3.  Pt will be independent with diaphragmatic breathing and down training activities in order to improve  pelvic floor relaxation.   Baseline:  Goal status: INITIAL   4.  Pt will be independent with the knack, urge suppression technique, and double voiding in order to improve bladder habits and decrease urinary incontinence.    Baseline:  Goal status: INITIAL   5.  Pt will be independent with use of squatty potty, relaxed toileting mechanics, and improved bowel movement techniques in order to increase ease of bowel movements and complete evacuation.    Baseline:  Goal status: INITIAL       LONG TERM GOALS: Target date: 07/09/2022   Pt will be independent with advanced HEP.    Baseline:  Goal status: INITIAL   2.  Pt will demonstrate normal pelvic floor muscle tone and A/ROM, able to achieve 4/5 strength with contractions and 10 sec endurance, in order to provide appropriate lumbopelvic support in functional activities.    Baseline:  Goal status: INITIAL   3.  Pt will be able to go 2-3 hours in between voids without urgency or incontinence in order to improve QOL and perform all functional activities with less difficulty.    Baseline:  Goal status: INITIAL   4.  Pt will have regular bowel movement with normal consistent, no straining, and no pain.  Baseline:  Goal status: INITIAL   5.  Pt will report 0/10 pain with vaginal penetration in order to improve intimate relationship with partner.     Baseline:  Goal status: INITIAL   6.  Pt will demonstrate increase in all impaired hip strength by 1 muscle grades and bil negative stork test in order to demonstrate improved lumbopelvic support and increase functional ability.    Baseline:  Goal status: INITIAL   PLAN:   PT FREQUENCY: 1-2x/week   PT DURATION: 12 weeks   PLANNED INTERVENTIONS: Therapeutic exercises, Therapeutic activity, Neuromuscular re-education, Balance training, Gait training, Patient/Family education, Self Care, Joint mobilization, Dry Needling, Biofeedback, and Manual therapy   PLAN FOR NEXT SESSION:  possible internal vaginal pelvic floor assessment; See how dry needling went and maybe do some on the right lumbar and quadratus. continue core stability training; progress down training.    Eulis Foster, PT 05/09/22 8:10 AM

## 2022-05-22 ENCOUNTER — Ambulatory Visit: Payer: Medicaid Other | Attending: Student

## 2022-05-22 DIAGNOSIS — R278 Other lack of coordination: Secondary | ICD-10-CM | POA: Insufficient documentation

## 2022-05-22 DIAGNOSIS — M6281 Muscle weakness (generalized): Secondary | ICD-10-CM | POA: Diagnosis present

## 2022-05-22 DIAGNOSIS — M5459 Other low back pain: Secondary | ICD-10-CM | POA: Diagnosis present

## 2022-05-22 DIAGNOSIS — M62838 Other muscle spasm: Secondary | ICD-10-CM | POA: Diagnosis present

## 2022-05-22 DIAGNOSIS — R279 Unspecified lack of coordination: Secondary | ICD-10-CM

## 2022-05-22 DIAGNOSIS — R293 Abnormal posture: Secondary | ICD-10-CM

## 2022-05-22 DIAGNOSIS — R102 Pelvic and perineal pain: Secondary | ICD-10-CM | POA: Diagnosis present

## 2022-05-22 NOTE — Therapy (Signed)
OUTPATIENT PHYSICAL THERAPY TREATMENT NOTE   Patient Name: Michele Carney MRN: [id number] DOB:[birth year], 22 y.o., female Today's Date: 05/22/2022  PCP: Everrett Coombe, DO  REFERRING PROVIDER: Corlis Hove, NP   END OF SESSION:   PT End of Session - 05/22/22 1154     Visit Number 3    Date for PT Re-Evaluation 07/09/22    Authorization Type [employer]    Authorization Time Period 11/28-2/26    Authorization - Visit Number 2    Authorization - Number of Visits 13    PT Start Time 1154    PT Stop Time 1225    PT Time Calculation (min) 31 min    Activity Tolerance Patient tolerated treatment well    Behavior During Therapy WFL for tasks assessed/performed              Past Medical History:  Diagnosis Date   Anxiety    Depression    Past Surgical History:  Procedure Laterality Date   CESAREAN SECTION N/A 02/08/2020   Procedure: CESAREAN SECTION;  Surgeon: Lazaro Arms, MD;  Location: [facility] ORS;  Service: Obstetrics;  Laterality: N/A;   Patient Active Problem List   Diagnosis Date Noted   Well adult exam 03/07/2022   Acute maxillary sinusitis 03/07/2022   Chronic bilateral low back pain without sciatica 11/30/2020   Iron deficiency anemia 02/10/2020   Cesarean delivery delivered 02/08/2020   Non-reactive NST (non-stress test) 02/06/2020   Significant discrepancy between uterine size and clinical dates, antepartum, second trimester 11/16/2019   Yeast vaginitis 10/14/2019   GBS (group B streptococcus) UTI complicating pregnancy 08/31/2019   Supervision of normal first pregnancy, antepartum 07/05/2019   MDD (major depressive disorder) 05/25/2019   Chronic migraine without aura without status migrainosus, not intractable 12/30/2017   REFERRING DIAG: M62.89 (ICD-10-CM) - Pelvic floor dysfunction in female N94.10 (ICD-10-CM) - Dyspareunia in female R35.0 (ICD-10-CM) - Urinary frequency   THERAPY DIAG:  Abnormal posture   Muscle weakness  (generalized)   Other muscle spasm   Unspecified lack of coordination   Other low back pain   Pelvic pain   Rationale for Evaluation and Treatment: Rehabilitation   ONSET DATE: 02/08/20   SUBJECTIVE:      Pt states that dry needling was very weird and painful last session. She does think that it was helpful. She noticed that she did not have low back pain after. However, she states that pain has returned to baseline. She is currently in 6/10 Rt hip/low back/Rt LE pain.  04/16/22 SUBJECTIVE STATEMENT: Pt states that low back pain started 2 years ago after having daughter and it seems to get worse around her cycle. She has had pain with intercourse that started around this time as well - been consistently bad since she had a yeast infection this past May. She will feel lower abdominal pain during intercourse. She will have sciatica on Lt side.      PAIN:  Are you having pain? Yes NPRS scale: 8/10 low back;  Pain location:  low back pain   Pain type: dull Pain description: aching    Aggravating factors: any activity, menstrual cycle, flexion Relieving factors: epsom salt baths, icy hot   PRECAUTIONS: None   WEIGHT BEARING RESTRICTIONS: No   FALLS:  Has patient fallen in last 6 months? 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   PLOF: Independent   PATIENT GOALS: decrease back pain and pain free intercourse; reduce tightness   PERTINENT HISTORY:  C/section Sexual abuse: No   BOWEL MOVEMENT: Pain with bowel movement: Yes - pain around the anus and in abdomen, 6/10 Type of bowel movement:Type (Bristol Stool Scale) 3 - varies in consistency, Frequency at  least 1x/day, and Strain Yes - she's trying to strain less, uses squatty potty Fully empty rectum: No Leakage: No Pads: No Fiber supplement: Noworking on eating more fiber   URINATION: Pain with urination: Yes - sometimes; gyn has ruled out infection multiple times , 6/10 Fully empty bladder: No Stream: Strong Urgency: Yes: immediately or will leak Frequency: 2-3x/hour Leakage: Urge to void, Walking to the bathroom, Laughing, and at night she will wake up wet (6-8x in the last month) Pads: No   INTERCOURSE: Pain with intercourse: Initial Penetration, During Penetration, and After Intercourse Ability to have vaginal penetration:  No Climax: normal Marinoff Scale: 0/3   PREGNANCY: Vaginal deliveries 0 Tearing No C-section deliveries 1 - she does have incisional pain and feels trauma surrounding delivery  Currently pregnant No   PROLAPSE: Feels like organs are trying to come out since delivery   OBJECTIVE: (objective measures completed at initial evaluation unless otherwise dated)    PATIENT SURVEYS:    [id number]   COGNITION: Overall cognitive status: Within functional limits for tasks assessed                          SENSATION: Light touch: Appears intact Proprioception: Appears intact   MUSCLE LENGTH: Hip flexor: inc low back pain on Lt; WNL Piriformis: restricted bil, pressure in lower abdomen IR: painful on Lt in groin/lower abdomen; WNL bil   FUNCTIONAL TESTS:              Squat: Large Rt weight shift (hurt Lt foot at work the other day)             Single leg stance: 10 sec bil, less stable on Lt             Stork: Lt stance with Rt pelvic drop              GAIT: Comments: WNL   POSTURE: forward head, increased lumbar lordosis, decreased thoracic kyphosis, anterior pelvic tilt, and Rt thoracic rotation (possible very mild Rt thoracic curvature)   LUMBARAROM/PROM:   A/PROM A/PROM  Eval (% available)  Flexion 100  Extension 50  Right lateral flexion  50  Left lateral flexion 50  Right rotation 50  Left rotation 50   (Blank rows = not tested)     LOWER EXTREMITY MMT:   MMT Right eval Left eval  Hip flexion 4 4  Hip extension 3 3  Hip abduction 4- 4-  Hip adduction 4- 4-  Hip internal rotation 4- 4-  Hip external rotation 4- 4-  Knee flexion      Knee extension      Ankle dorsiflexion      Ankle plantarflexion      Ankle inversion      Ankle eversion      *all reproduced low back pain; abd/adduction, ext, and bil hip rotation testing reproduced bil groin/pelvic pain PALPATION:   General  -1 finger width separation at umbilicus with distortion             -tenderness directly over Rt SIJ with surrounding muscle tension             -bil lumbar paraspinal tightness and tenderness from T12-L5                 External Perineal Exam NA                             Internal Pelvic Floor NA   Patient confirms identification and approves PT to assess internal pelvic floor and treatment Yes - future treatment session    PELVIC MMT:   MMT eval  Vaginal    Internal Anal Sphincter    External Anal Sphincter    Puborectalis    Diastasis Recti    (Blank rows = not tested)         TONE: NA   PROLAPSE: NA   TODAY'S TREATMENT 05/22/22: Manual: Trigger Point Dry-Needling  Treatment instructions: Expect mild to moderate muscle soreness. S/S of pneumothorax if dry needled over a lung field, and to seek immediate medical attention should they occur. Patient verbalized understanding of these instructions and education.  Patient Consent Given: Yes Education handout provided: Previously provided Muscles treated: Rt glutes Electrical stimulation performed: No Parameters: N/A Treatment response/outcome: painful, but good release Soft tissue mobilization Rt hip/low back Neuromuscular re-education: Seated hip adduction ball squeeze 2 x 10 Seated hip abduction blue band 2 x 10  Exercises: Seated piriformis stretch 60  sec Pelvic tilts seated 10x   TREATMENT:     05/01/2022 Manual: Soft tissue mobilization: To assess for dry needling Manual work to the lumbar paraspinals, right quadratus, right gluteals Scar tissue mobilization: To abdominal c-section scar going through the restrictions Educated patient on how to massage the c-section scar Myofascial release: Quadruped pulling the tissue from the back to the abdomen and gently releasing the abdominal tissue Quadruped with hands in the lower abdomen releasing the tissue as she rocks back and forth and diagonals Trigger Point Dry-Needling  Treatment instructions: Expect mild to moderate muscle soreness. S/S of pneumothorax if dry needled over a lung field, and to seek immediate medical attention should they occur. Patient verbalized understanding of these instructions and education.  Patient Consent Given: Yes Education handout provided: Yes Muscles treated: lumbar multifidi Electrical stimulation performed: No Parameters: N/A Treatment response/outcome: elongation of muscle and trigger point response  Exercises: Stretches/mobility: Cat/cow 2 x 10 Child's pose 10 breaths Strengthening: Tried the recumbent bike but caused pain in the lower abdomen.  DATE: 04/16/22  EVAL  Neuromuscular re-education: Core retraining:  Transversus abdominus training with multimodal cues for improved motor control and breath coordination Down training: Diaphragmatic breathing Cat/cow 2 x 10 Child's pose 10 breaths Check all possible CPT codes: [phone]- Neuro Re-education                Check all conditions that are expected to impact treatment: Current pregnancy or recent postpartum         If treatment provided at initial evaluation, no treatment charged due to lack of authorization.                                    PATIENT  EDUCATION:  Education details: information on dry needling, scar massage Person educated: Patient Education method: Explanation, Demonstration, Tactile cues, Verbal cues, and Handouts Education comprehension: verbalized understanding   HOME EXERCISE PROGRAM: [id number]   ASSESSMENT:   CLINICAL IMPRESSION: Pt saw good improvement with DN and manual techniques last session, so they were repeated with painful response, but good release of muscle tension. Negative pressure and regular soft tissue mobilization performed after to further decrease muscle tension. She still reported some stiffness in Rt side/LE, but stated that LE felt much better and there was not pain. She had some pain with exercises performed today, but was encouraged to try them when she has pain at home and on regular basis to help improve pelvic stability, circulation, and decrease pain. She will benefit from skilled PT intervention in order to decrease pain, address impairments, return to intercourse without pain, and improve bowel/bladder function in order to improve QOL.    OBJECTIVE IMPAIRMENTS: decreased activity tolerance, decreased coordination, decreased endurance, decreased strength, increased fascial restrictions, increased muscle spasms, impaired tone, postural dysfunction, and pain.    ACTIVITY LIMITATIONS: standing, squatting, sleeping, continence, and locomotion level   PARTICIPATION LIMITATIONS: interpersonal relationship, community activity, occupation, and school   PERSONAL FACTORS: 1 comorbidity: c/section  are also affecting patient's functional outcome.    REHAB POTENTIAL: Good   CLINICAL DECISION MAKING: Stable/uncomplicated   EVALUATION COMPLEXITY: Low     GOALS: Goals reviewed with patient? Yes   SHORT TERM GOALS: Target date: 05/14/22 - updated 05/22/22   Pt will be independent with HEP.    Baseline: Goal status: IN PROGRESS   2.  Pt will be able to correctly perform diaphragmatic breathing  and appropriate pressure management in order to prevent worsening vaginal wall laxity and improve pelvic floor A/ROM.    Baseline:  Goal status: IN PROGRESS   3.  Pt will be independent with diaphragmatic breathing and down training activities in order to improve pelvic floor relaxation.   Baseline:  Goal status: IN PROGRESS   4.  Pt will be independent with the knack, urge suppression technique, and double voiding in order to improve bladder habits and decrease urinary incontinence.    Baseline:  Goal status: IN PROGRESS   5.  Pt will be independent with use of squatty potty, relaxed toileting mechanics, and improved bowel movement techniques in order to increase ease of bowel movements and complete evacuation.    Baseline:  Goal status: IN PROGRESS       LONG TERM GOALS: Target date: 07/09/2022 - updated 05/22/22   Pt will be independent with advanced HEP.    Baseline:  Goal status: IN PROGRESS   2.  Pt will demonstrate normal pelvic floor muscle tone and  A/ROM, able to achieve 4/5 strength with contractions and 10 sec endurance, in order to provide appropriate lumbopelvic support in functional activities.    Baseline:  Goal status: IN PROGRESS   3.  Pt will be able to go 2-3 hours in between voids without urgency or incontinence in order to improve QOL and perform all functional activities with less difficulty.    Baseline:  Goal status: IN PROGRESS  4.  Pt will have regular bowel movement with normal consistent, no straining, and no pain.  Baseline:  Goal status: IN PROGRESS   5.  Pt will report 0/10 pain with vaginal penetration in order to improve intimate relationship with partner.     Baseline:  Goal status: IN PROGRESS   6.  Pt will demonstrate increase in all impaired hip strength by 1 muscle grades and bil negative stork test in order to demonstrate improved lumbopelvic support and increase functional ability.    Baseline:  Goal status: IN PROGRESS    PLAN:   PT FREQUENCY: 1-2x/week   PT DURATION: 12 weeks   PLANNED INTERVENTIONS: Therapeutic exercises, Therapeutic activity, Neuromuscular re-education, Balance training, Gait training, Patient/Family education, Self Care, Joint mobilization, Dry Needling, Biofeedback, and Manual therapy   PLAN FOR NEXT SESSION: possible internal vaginal pelvic floor assessment; See how dry needling went and maybe do some on the right lumbar and quadratus. continue core stability training; progress down training.    Julio Alm, PT, DPT01/07/2410:28 PM

## 2022-05-29 ENCOUNTER — Ambulatory Visit: Payer: Medicaid Other

## 2022-05-29 DIAGNOSIS — R102 Pelvic and perineal pain: Secondary | ICD-10-CM

## 2022-05-29 DIAGNOSIS — M62838 Other muscle spasm: Secondary | ICD-10-CM

## 2022-05-29 DIAGNOSIS — R293 Abnormal posture: Secondary | ICD-10-CM | POA: Diagnosis not present

## 2022-05-29 DIAGNOSIS — R279 Unspecified lack of coordination: Secondary | ICD-10-CM

## 2022-05-29 DIAGNOSIS — M5459 Other low back pain: Secondary | ICD-10-CM

## 2022-05-29 DIAGNOSIS — M6281 Muscle weakness (generalized): Secondary | ICD-10-CM

## 2022-05-29 NOTE — Therapy (Signed)
OUTPATIENT PHYSICAL THERAPY TREATMENT NOTE   Patient Name: Michele Carney MRN: [id number] DOB:[birth year], 22 y.o., female Today's Date: 05/29/2022  PCP: Everrett Coombe, DO  REFERRING PROVIDER: Corlis Hove, NP   END OF SESSION:   PT End of Session - 05/29/22 1142     Visit Number 4    Date for PT Re-Evaluation 07/09/22    Authorization Type [employer]    Authorization Time Period 11/28-2/26    Authorization - Visit Number 3    Authorization - Number of Visits 13    PT Start Time 1100    PT Stop Time 1140    PT Time Calculation (min) 40 min    Activity Tolerance Patient tolerated treatment well    Behavior During Therapy WFL for tasks assessed/performed               Past Medical History:  Diagnosis Date   Anxiety    Depression    Past Surgical History:  Procedure Laterality Date   CESAREAN SECTION N/A 02/08/2020   Procedure: CESAREAN SECTION;  Surgeon: Lazaro Arms, MD;  Location: [facility]S;  Service: Obstetrics;  Laterality: N/A;   Patient Active Problem List   Diagnosis Date Noted   Well adult exam 03/07/2022   Acute maxillary sinusitis 03/07/2022   Chronic bilateral low back pain without sciatica 11/30/2020   Iron deficiency anemia 02/10/2020   Cesarean delivery delivered 02/08/2020   Non-reactive NST (non-stress test) 02/06/2020   Significant discrepancy between uterine size and clinical dates, antepartum, second trimester 11/16/2019   Yeast vaginitis 10/14/2019   GBS (group B streptococcus) UTI complicating pregnancy 08/31/2019   Supervision of normal first pregnancy, antepartum 07/05/2019   MDD (major depressive disorder) 05/25/2019   Chronic migraine without aura without status migrainosus, not intractable 12/30/2017   REFERRING DIAG: M62.89 (ICD-10-CM) - Pelvic floor dysfunction in female N94.10 (ICD-10-CM) - Dyspareunia in female R35.0 (ICD-10-CM) - Urinary frequency   THERAPY DIAG:  Abnormal posture   Muscle weakness  (generalized)   Other muscle spasm   Unspecified lack of coordination   Other low back pain   Pelvic pain   Rationale for Evaluation and Treatment: Rehabilitation   ONSET DATE: 02/08/20   SUBJECTIVE:   Pt states that she has done exercises 4 times over the last week. She has seen a significant decrease in her pain. She currently rates her pain as a 4/10.                                                                                                                                                                                       04/16/22 SUBJECTIVE STATEMENT: Pt  states that low back pain started 2 years ago after having daughter and it seems to get worse around her cycle. She has had pain with intercourse that started around this time as well - been consistently bad since she had a yeast infection this past May. She will feel lower abdominal pain during intercourse. She will have sciatica on Lt side.      PAIN:  Are you having pain? Yes NPRS scale: 8/10 low back;  Pain location:  low back pain   Pain type: dull Pain description: aching    Aggravating factors: any activity, menstrual cycle, flexion Relieving factors: epsom salt baths, icy hot   PRECAUTIONS: None   WEIGHT BEARING RESTRICTIONS: No   FALLS:  Has patient fallen in last 6 months? 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   PLOF: Independent   PATIENT GOALS: decrease back pain and pain free intercourse; reduce tightness   PERTINENT HISTORY:  C/section Sexual abuse: No   BOWEL MOVEMENT: Pain with bowel movement: Yes - pain around the anus and in abdomen, 6/10 Type of bowel movement:Type (Bristol Stool Scale) 3 - varies in consistency, Frequency at least 1x/day, and Strain Yes - she's trying to strain less, uses squatty potty Fully empty rectum: No Leakage:  No Pads: No Fiber supplement: Noworking on eating more fiber   URINATION: Pain with urination: Yes - sometimes; gyn has ruled out infection multiple times , 6/10 Fully empty bladder: No Stream: Strong Urgency: Yes: immediately or will leak Frequency: 2-3x/hour Leakage: Urge to void, Walking to the bathroom, Laughing, and at night she will wake up wet (6-8x in the last month) Pads: No   INTERCOURSE: Pain with intercourse: Initial Penetration, During Penetration, and After Intercourse Ability to have vaginal penetration:  No Climax: normal Marinoff Scale: 0/3   PREGNANCY: Vaginal deliveries 0 Tearing No C-section deliveries 1 - she does have incisional pain and feels trauma surrounding delivery  Currently pregnant No   PROLAPSE: Feels like organs are trying to come out since delivery   OBJECTIVE: (objective measures completed at initial evaluation unless otherwise dated)    PATIENT SURVEYS:    [id number] 81   COGNITION: Overall cognitive status: Within functional limits for tasks assessed                          SENSATION: Light touch: Appears intact Proprioception: Appears intact   MUSCLE LENGTH: Hip flexor: inc low back pain on Lt; WNL Piriformis: restricted bil, pressure in lower abdomen IR: painful on Lt in groin/lower abdomen; WNL bil   FUNCTIONAL TESTS:              Squat: Large Rt weight shift (hurt Lt foot at work the other day)             Single leg stance: 10 sec bil, less stable on Lt             Stork: Lt stance with Rt pelvic drop              GAIT: Comments: WNL   POSTURE: forward head, increased lumbar lordosis, decreased thoracic kyphosis, anterior pelvic tilt, and Rt thoracic rotation (possible very mild Rt thoracic curvature)   LUMBARAROM/PROM:   A/PROM A/PROM  Eval (% available)  Flexion 100  Extension 50  Right lateral flexion 50  Left lateral flexion 50  Right rotation 50  Left rotation 50   (Blank rows = not tested)     LOWER  EXTREMITY MMT:   MMT Right eval Left eval  Hip flexion 4 4  Hip extension 3 3  Hip abduction 4- 4-  Hip adduction 4- 4-  Hip internal rotation 4- 4-  Hip external rotation 4- 4-  Knee flexion      Knee extension      Ankle dorsiflexion      Ankle plantarflexion      Ankle inversion      Ankle eversion      *all reproduced low back pain; abd/adduction, ext, and bil hip rotation testing reproduced bil groin/pelvic pain PALPATION:   General  -1 finger width separation at umbilicus with distortion             -tenderness directly over Rt SIJ with surrounding muscle tension             -bil lumbar paraspinal tightness and tenderness from T12-L5                 External Perineal Exam NA                             Internal Pelvic Floor NA   Patient confirms identification and approves PT to assess internal pelvic floor and treatment Yes - future treatment session    PELVIC MMT:   MMT eval  Vaginal    Internal Anal Sphincter    External Anal Sphincter    Puborectalis    Diastasis Recti    (Blank rows = not tested)         TONE: NA   PROLAPSE: NA   TODAY'S TREATMENT 05/29/22: Neuromuscular re-education: Transversus abdominus training with multimodal cues for improved motor control and breath coordination Cat cow with core activation 2 x 10 Child's pose in standing bent over table 10 breaths Seated band pull down 10x Bridge with march 10x Frog bridge 10x Bridge with single arm press 10x bil, 7lbs Exercises: Sidestepping red band 3 laps 3-way kick 10x each, bil Therapeutic activities: Perineal massage   TREATMENT 05/22/22: Manual: Trigger Point Dry-Needling  Treatment instructions: Expect mild to moderate muscle soreness. S/S of pneumothorax if dry needled over a lung field, and to seek immediate medical attention should they occur. Patient verbalized understanding of these instructions and education.  Patient Consent Given: Yes Education handout provided:  Previously provided Muscles treated: Rt glutes Electrical stimulation performed: No Parameters: N/A Treatment response/outcome: painful, but good release Soft tissue mobilization Rt hip/low back Neuromuscular re-education: Seated hip adduction ball squeeze 2 x 10 Seated hip abduction blue band 2 x 10  Exercises: Seated piriformis stretch 60 sec Pelvic tilts seated 10x   TREATMENT:     05/01/2022 Manual: Soft tissue mobilization: To assess for dry needling Manual work to the lumbar paraspinals, right quadratus, right gluteals Scar tissue mobilization: To abdominal c-section scar going through the restrictions Educated patient on how to massage the c-section scar Myofascial release: Quadruped pulling the tissue from the back to the abdomen and gently releasing the abdominal tissue Quadruped with hands in the lower abdomen releasing the tissue as she rocks back and forth and diagonals Trigger Point Dry-Needling  Treatment instructions: Expect mild to moderate muscle soreness. S/S of pneumothorax if dry needled over a lung field, and to seek immediate medical attention should they occur. Patient verbalized understanding  of these instructions and education.  Patient Consent Given: Yes Education handout provided: Yes Muscles treated: lumbar multifidi Electrical stimulation performed: No Parameters: N/A Treatment response/outcome: elongation of muscle and trigger point response  Exercises: Stretches/mobility: Cat/cow 2 x 10 Child's pose 10 breaths Strengthening: Tried the recumbent bike but caused pain in the lower abdomen.     PATIENT EDUCATION:  Education details: information on dry needling, scar massage Person educated: Patient Education method: Explanation, Demonstration, Tactile cues, Verbal cues, and Handouts Education comprehension: verbalized understanding   HOME EXERCISE PROGRAM: [id number]   ASSESSMENT:   CLINICAL IMPRESSION: Pt is doing very well with  significant reduction in pain and performing regular HEP. Due to this, focus placed on core exercises this treatment session with excellent tolerance. She did require cues to help facilitate core activation in exercises and encouragement that difficult exercises will become easier with improved muscle motor control; we saw this after several repetitions of each exercise today. She denied any increase in Rt sided low back/hip pain. We did begin discussion dyspareunia more again today and she is willing to start regular performance of perineal massage for desensitization; she was told that the goal is for this not to cause any discomfort. She did very well with all strengthening progressions with no increase in pain; HEP updated. She will benefit from skilled PT intervention in order to decrease pain, address impairments, return to intercourse without pain, and improve bowel/bladder function in order to improve QOL.    OBJECTIVE IMPAIRMENTS: decreased activity tolerance, decreased coordination, decreased endurance, decreased strength, increased fascial restrictions, increased muscle spasms, impaired tone, postural dysfunction, and pain.    ACTIVITY LIMITATIONS: standing, squatting, sleeping, continence, and locomotion level   PARTICIPATION LIMITATIONS: interpersonal relationship, community activity, occupation, and school   PERSONAL FACTORS: 1 comorbidity: c/section  are also affecting patient's functional outcome.    REHAB POTENTIAL: Good   CLINICAL DECISION MAKING: Stable/uncomplicated   EVALUATION COMPLEXITY: Low     GOALS: Goals reviewed with patient? Yes   SHORT TERM GOALS: Target date: 05/14/22 - updated 05/22/22   Pt will be independent with HEP.    Baseline: Goal status: IN PROGRESS   2.  Pt will be able to correctly perform diaphragmatic breathing and appropriate pressure management in order to prevent worsening vaginal wall laxity and improve pelvic floor A/ROM.    Baseline:  Goal  status: IN PROGRESS   3.  Pt will be independent with diaphragmatic breathing and down training activities in order to improve pelvic floor relaxation.   Baseline:  Goal status: IN PROGRESS   4.  Pt will be independent with the knack, urge suppression technique, and double voiding in order to improve bladder habits and decrease urinary incontinence.    Baseline:  Goal status: IN PROGRESS   5.  Pt will be independent with use of squatty potty, relaxed toileting mechanics, and improved bowel movement techniques in order to increase ease of bowel movements and complete evacuation.    Baseline:  Goal status: IN PROGRESS       LONG TERM GOALS: Target date: 07/09/2022 - updated 05/22/22   Pt will be independent with advanced HEP.    Baseline:  Goal status: IN PROGRESS   2.  Pt will demonstrate normal pelvic floor muscle tone and A/ROM, able to achieve 4/5 strength with contractions and 10 sec endurance, in order to provide appropriate lumbopelvic support in functional activities.    Baseline:  Goal status: IN PROGRESS   3.  Pt will be  able to go 2-3 hours in between voids without urgency or incontinence in order to improve QOL and perform all functional activities with less difficulty.    Baseline:  Goal status: IN PROGRESS  4.  Pt will have regular bowel movement with normal consistent, no straining, and no pain.  Baseline:  Goal status: IN PROGRESS   5.  Pt will report 0/10 pain with vaginal penetration in order to improve intimate relationship with partner.     Baseline:  Goal status: IN PROGRESS   6.  Pt will demonstrate increase in all impaired hip strength by 1 muscle grades and bil negative stork test in order to demonstrate improved lumbopelvic support and increase functional ability.    Baseline:  Goal status: IN PROGRESS   PLAN:   PT FREQUENCY: 1-2x/week   PT DURATION: 12 weeks   PLANNED INTERVENTIONS: Therapeutic exercises, Therapeutic activity, Neuromuscular  re-education, Balance training, Gait training, Patient/Family education, Self Care, Joint mobilization, Dry Needling, Biofeedback, and Manual therapy   PLAN FOR NEXT SESSION: possible internal vaginal pelvic floor assessment; progress functional strengthening.    Heather Roberts, PT, DPT01/02/2410:43 AM

## 2022-06-05 ENCOUNTER — Ambulatory Visit: Payer: Medicaid Other

## 2022-06-05 DIAGNOSIS — R102 Pelvic and perineal pain: Secondary | ICD-10-CM

## 2022-06-05 DIAGNOSIS — R293 Abnormal posture: Secondary | ICD-10-CM | POA: Diagnosis not present

## 2022-06-05 DIAGNOSIS — M6281 Muscle weakness (generalized): Secondary | ICD-10-CM

## 2022-06-05 DIAGNOSIS — R278 Other lack of coordination: Secondary | ICD-10-CM

## 2022-06-05 DIAGNOSIS — M5459 Other low back pain: Secondary | ICD-10-CM

## 2022-06-05 DIAGNOSIS — R279 Unspecified lack of coordination: Secondary | ICD-10-CM

## 2022-06-05 DIAGNOSIS — M62838 Other muscle spasm: Secondary | ICD-10-CM

## 2022-06-05 NOTE — Therapy (Signed)
OUTPATIENT PHYSICAL THERAPY TREATMENT NOTE   Patient Name: Michele Carney MRN: [id number] DOB:July 08, 2000, 22 y.o., female Today's Date: 06/05/2022  PCP: Everrett Coombe, DO  REFERRING PROVIDER: Corlis Hove, NP   END OF SESSION:       Past Medical History:  Diagnosis Date   Anxiety    Depression    Past Surgical History:  Procedure Laterality Date   CESAREAN SECTION N/A 02/08/2020   Procedure: CESAREAN SECTION;  Surgeon: Lazaro Arms, MD;  Location: [facility] ORS;  Service: Obstetrics;  Laterality: N/A;   Patient Active Problem List   Diagnosis Date Noted   Well adult exam 03/07/2022   Acute maxillary sinusitis 03/07/2022   Chronic bilateral low back pain without sciatica 11/30/2020   Iron deficiency anemia 02/10/2020   Cesarean delivery delivered 02/08/2020   Non-reactive NST (non-stress test) 02/06/2020   Significant discrepancy between uterine size and clinical dates, antepartum, second trimester 11/16/2019   Yeast vaginitis 10/14/2019   GBS (group B streptococcus) UTI complicating pregnancy 08/31/2019   Supervision of normal first pregnancy, antepartum 07/05/2019   MDD (major depressive disorder) 05/25/2019   Chronic migraine without aura without status migrainosus, not intractable 12/30/2017   REFERRING DIAG: M62.89 (ICD-10-CM) - Pelvic floor dysfunction in female N94.10 (ICD-10-CM) - Dyspareunia in female R35.0 (ICD-10-CM) - Urinary frequency   THERAPY DIAG:  Abnormal posture   Muscle weakness (generalized)   Other muscle spasm   Unspecified lack of coordination   Other low back pain   Pelvic pain   Rationale for Evaluation and Treatment: Rehabilitation   ONSET DATE: 02/08/20   SUBJECTIVE:   Pt has had a good week with pain and is not noticing any currently (0/10 pain)                                                                                                                                                                                       04/16/22 SUBJECTIVE STATEMENT: Pt states that low back pain started 2 years ago after having daughter and it seems to get worse around her cycle. She has had pain with intercourse that started around this time as well - been consistently bad since she had a yeast infection this past May. She will feel lower abdominal pain during intercourse. She will have sciatica on Lt side.      PAIN:  Are you having pain? Yes NPRS scale: 8/10 low back;  Pain location:  low back pain   Pain type: dull Pain description: aching    Aggravating factors: any activity, menstrual cycle, flexion Relieving factors: epsom salt baths, icy hot   PRECAUTIONS: None   WEIGHT BEARING RESTRICTIONS: No  FALLS:  Has patient fallen in last 6 months? 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   PLOF: Independent   PATIENT GOALS: decrease back pain and pain free intercourse; reduce tightness   PERTINENT HISTORY:  C/section Sexual abuse: No   BOWEL MOVEMENT: Pain with bowel movement: Yes - pain around the anus and in abdomen, 6/10 Type of bowel movement:Type (Bristol Stool Scale) 3 - varies in consistency, Frequency at least 1x/day, and Strain Yes - she's trying to strain less, uses squatty potty Fully empty rectum: No Leakage: No Pads: No Fiber supplement: Noworking on eating more fiber   URINATION: Pain with urination: Yes - sometimes; gyn has ruled out infection multiple times , 6/10 Fully empty bladder: No Stream: Strong Urgency: Yes: immediately or will leak Frequency: 2-3x/hour Leakage: Urge to void, Walking to the bathroom, Laughing, and at night she will wake up wet (6-8x in the last month) Pads: No   INTERCOURSE: Pain with intercourse: Initial Penetration, During Penetration, and After Intercourse Ability to have vaginal penetration:  No Climax:  normal Marinoff Scale: 0/3   PREGNANCY: Vaginal deliveries 0 Tearing No C-section deliveries 1 - she does have incisional pain and feels trauma surrounding delivery  Currently pregnant No   PROLAPSE: Feels like organs are trying to come out since delivery   OBJECTIVE:  06/05/22:             External Perineal Exam pain along superficial transverse perineal/ischiocavernosus and inferior pubic ramus                             Internal Pelvic Floor Tenderness at posterior fourchette; pain throughout Rt superficial/deep pelvic floor (Lt with mild increase in tone, but no pain   Patient confirms identification and approves PT to assess internal pelvic floor and treatment Yes    PELVIC MMT:   MMT eval  Vaginal  1/5, poor relaxation  Internal Anal Sphincter    External Anal Sphincter    Puborectalis    Diastasis Recti    (Blank rows = not tested)         TONE: High     PATIENT SURVEYS:    [id number]   COGNITION: Overall cognitive status: Within functional limits for tasks assessed                          SENSATION: Light touch: Appears intact Proprioception: Appears intact   MUSCLE LENGTH: Hip flexor: inc low back pain on Lt; WNL Piriformis: restricted bil, pressure in lower abdomen IR: painful on Lt in groin/lower abdomen; WNL bil   FUNCTIONAL TESTS:              Squat: Large Rt weight shift (hurt Lt foot at work the other day)             Single leg stance: 10 sec bil, less stable on Lt             Stork: Lt stance with Rt pelvic drop              GAIT: Comments: WNL   POSTURE: forward head, increased lumbar lordosis, decreased thoracic kyphosis, anterior pelvic tilt, and Rt thoracic rotation (possible very mild Rt thoracic curvature)   LUMBARAROM/PROM:   A/PROM A/PROM  Eval (%  available)  Flexion 100  Extension 50  Right lateral flexion 50  Left lateral flexion 50  Right rotation 50  Left rotation 50   (Blank rows = not tested)     LOWER EXTREMITY  MMT:   MMT Right eval Left eval  Hip flexion 4 4  Hip extension 3 3  Hip abduction 4- 4-  Hip adduction 4- 4-  Hip internal rotation 4- 4-  Hip external rotation 4- 4-  Knee flexion      Knee extension      Ankle dorsiflexion      Ankle plantarflexion      Ankle inversion      Ankle eversion      *all reproduced low back pain; abd/adduction, ext, and bil hip rotation testing reproduced bil groin/pelvic pain PALPATION:   General  -1 finger width separation at umbilicus with distortion             -tenderness directly over Rt SIJ with surrounding muscle tension             -bil lumbar paraspinal tightness and tenderness from T12-L5        TODAY'S TREATMENT 06/05/22 Manual: Pt provides verbal consent for internal vaginal/rectal pelvic floor exam. Internal vaginal pelvic floor exam Rt superficia/deep pelvic floor release to tolerance Gentle P/ROM of Rt hip with internal pelvic floor muscle release  Therapeutic activities: C-section scar mobilization Regular perineal massage Squatty potty/relaxed toileting mechanics Bowel massage   TREATMENT 05/29/22: Neuromuscular re-education: Transversus abdominus training with multimodal cues for improved motor control and breath coordination Cat cow with core activation 2 x 10 Child's pose in standing bent over table 10 breaths Seated band pull down 10x Bridge with march 10x Frog bridge 10x Bridge with single arm press 10x bil, 7lbs Exercises: Sidestepping red band 3 laps 3-way kick 10x each, bil Therapeutic activities: Perineal massage   TREATMENT 05/22/22: Manual: Trigger Point Dry-Needling  Treatment instructions: Expect mild to moderate muscle soreness. S/S of pneumothorax if dry needled over a lung field, and to seek immediate medical attention should they occur. Patient verbalized understanding of these instructions and education.  Patient Consent Given: Yes Education handout provided: Previously provided Muscles  treated: Rt glutes Electrical stimulation performed: No Parameters: N/A Treatment response/outcome: painful, but good release Soft tissue mobilization Rt hip/low back Neuromuscular re-education: Seated hip adduction ball squeeze 2 x 10 Seated hip abduction blue band 2 x 10  Exercises: Seated piriformis stretch 60 sec Pelvic tilts seated 10x     PATIENT EDUCATION:  Education details: information on dry needling, scar massage Person educated: Patient Education method: Explanation, Demonstration, Tactile cues, Verbal cues, and Handouts Education comprehension: verbalized understanding   HOME EXERCISE PROGRAM: [id number]   ASSESSMENT:   CLINICAL IMPRESSION: Pt doing very well with low back/hip pain demonstrated by 0/10 pain today. This allowed [place] to perform internal pelvic floor assessment due to continued pain with intercourse and role that muscle spasm in pelvic floor may be having on other pain. She had significant Rt sided tension/pain and poor ability to relax pelvic floor muscles. We started with external release of superficial pelvic floor muscles, addressing adductor fascia at the same time. She tolerated internal superficial and deep pelvic floor muscle release well without allowing pain to increase more than 2 points on VAS. We discussed squatty potty, relaxed toileting mechanics and bowel massage to help improve constipation. She will continue to benefit from skilled PT intervention in order to decrease pain, address impairments, return to  intercourse without pain, and improve bowel/bladder function in order to improve QOL.    OBJECTIVE IMPAIRMENTS: decreased activity tolerance, decreased coordination, decreased endurance, decreased strength, increased fascial restrictions, increased muscle spasms, impaired tone, postural dysfunction, and pain.    ACTIVITY LIMITATIONS: standing, squatting, sleeping, continence, and locomotion level   PARTICIPATION LIMITATIONS: interpersonal  relationship, community activity, occupation, and school   PERSONAL FACTORS: 1 comorbidity: c/section  are also affecting patient's functional outcome.    REHAB POTENTIAL: Good   CLINICAL DECISION MAKING: Stable/uncomplicated   EVALUATION COMPLEXITY: Low     GOALS: Goals reviewed with patient? Yes   SHORT TERM GOALS: Target date: 05/14/22 - updated 05/22/22   Pt will be independent with HEP.    Baseline: Goal status: IN PROGRESS   2.  Pt will be able to correctly perform diaphragmatic breathing and appropriate pressure management in order to prevent worsening vaginal wall laxity and improve pelvic floor A/ROM.    Baseline:  Goal status: IN PROGRESS   3.  Pt will be independent with diaphragmatic breathing and down training activities in order to improve pelvic floor relaxation.   Baseline:  Goal status: IN PROGRESS   4.  Pt will be independent with the knack, urge suppression technique, and double voiding in order to improve bladder habits and decrease urinary incontinence.    Baseline:  Goal status: IN PROGRESS   5.  Pt will be independent with use of squatty potty, relaxed toileting mechanics, and improved bowel movement techniques in order to increase ease of bowel movements and complete evacuation.    Baseline:  Goal status: IN PROGRESS       LONG TERM GOALS: Target date: 07/09/2022 - updated 05/22/22   Pt will be independent with advanced HEP.    Baseline:  Goal status: IN PROGRESS   2.  Pt will demonstrate normal pelvic floor muscle tone and A/ROM, able to achieve 4/5 strength with contractions and 10 sec endurance, in order to provide appropriate lumbopelvic support in functional activities.    Baseline:  Goal status: IN PROGRESS   3.  Pt will be able to go 2-3 hours in between voids without urgency or incontinence in order to improve QOL and perform all functional activities with less difficulty.    Baseline:  Goal status: IN PROGRESS  4.  Pt will have  regular bowel movement with normal consistent, no straining, and no pain.  Baseline:  Goal status: IN PROGRESS   5.  Pt will report 0/10 pain with vaginal penetration in order to improve intimate relationship with partner.     Baseline:  Goal status: IN PROGRESS   6.  Pt will demonstrate increase in all impaired hip strength by 1 muscle grades and bil negative stork test in order to demonstrate improved lumbopelvic support and increase functional ability.    Baseline:  Goal status: IN PROGRESS   PLAN:   PT FREQUENCY: 1-2x/week   PT DURATION: 12 weeks   PLANNED INTERVENTIONS: Therapeutic exercises, Therapeutic activity, Neuromuscular re-education, Balance training, Gait training, Patient/Family education, Self Care, Joint mobilization, Dry Needling, Biofeedback, and Manual therapy   PLAN FOR NEXT SESSION: possible internal vaginal pelvic floor release; progress functional strengthening.    Heather Roberts, PT, DPT01/17/2410:46 AM

## 2022-06-12 ENCOUNTER — Ambulatory Visit: Payer: Medicaid Other

## 2022-06-12 DIAGNOSIS — M6281 Muscle weakness (generalized): Secondary | ICD-10-CM

## 2022-06-12 DIAGNOSIS — R293 Abnormal posture: Secondary | ICD-10-CM

## 2022-06-12 DIAGNOSIS — R279 Unspecified lack of coordination: Secondary | ICD-10-CM

## 2022-06-12 DIAGNOSIS — M5459 Other low back pain: Secondary | ICD-10-CM

## 2022-06-12 DIAGNOSIS — M62838 Other muscle spasm: Secondary | ICD-10-CM

## 2022-06-12 DIAGNOSIS — R102 Pelvic and perineal pain: Secondary | ICD-10-CM

## 2022-06-12 NOTE — Therapy (Signed)
OUTPATIENT PHYSICAL THERAPY TREATMENT NOTE   Patient Name: Michele Carney MRN: [id number] DOB:[birth year], 22 y.o., female Today's Date: 06/12/2022  PCP: Luetta Nutting, DO  REFERRING PROVIDER: Johnston Ebbs, NP   END OF SESSION:   PT End of Session - 06/12/22 1113     Visit Number 6    Date for PT Re-Evaluation 07/09/22    Authorization Type [employer]    Authorization Time Period 11/28-2/26    Authorization - Visit Number 5    Authorization - Number of Visits 13    PT Start Time 1027    PT Stop Time 1145    PT Time Calculation (min) 34 min    Activity Tolerance Patient tolerated treatment well    Behavior During Therapy WFL for tasks assessed/performed                Past Medical History:  Diagnosis Date   Anxiety    Depression    Past Surgical History:  Procedure Laterality Date   CESAREAN SECTION N/A 02/08/2020   Procedure: CESAREAN SECTION;  Surgeon: Florian Buff, MD;  Location: [facility];  Service: Obstetrics;  Laterality: N/A;   Patient Active Problem List   Diagnosis Date Noted   Well adult exam 03/07/2022   Acute maxillary sinusitis 03/07/2022   Chronic bilateral low back pain without sciatica 11/30/2020   Iron deficiency anemia 02/10/2020   Cesarean delivery delivered 02/08/2020   Non-reactive NST (non-stress test) 02/06/2020   Significant discrepancy between uterine size and clinical dates, antepartum, second trimester 11/16/2019   Yeast vaginitis 10/14/2019   GBS (group B streptococcus) UTI complicating pregnancy 25/36/6440   Supervision of normal first pregnancy, antepartum 07/05/2019   MDD (major depressive disorder) 05/25/2019   Chronic migraine without aura without status migrainosus, not intractable 12/30/2017   REFERRING DIAG: M62.89 (ICD-10-CM) - Pelvic floor dysfunction in female N94.10 (ICD-10-CM) - Dyspareunia in female R35.0 (ICD-10-CM) - Urinary frequency   THERAPY DIAG:  Abnormal posture   Muscle weakness  (generalized)   Other muscle spasm   Unspecified lack of coordination   Other low back pain   Pelvic pain   Rationale for Evaluation and Treatment: Rehabilitation   ONSET DATE: 02/08/20   SUBJECTIVE: Pt states that she recently had intercourse and it was very painful, 10/10. Her low back pain has been worse over the last week and is currently 4/10. She has been having a very hard time getting to perineal massage and other exercises, but has been doing some stretching.  04/16/22 SUBJECTIVE STATEMENT: Pt states that low back pain started 2 years ago after having daughter and it seems to get worse around her cycle. She has had pain with intercourse that started around this time as well - been consistently bad since she had a yeast infection this past May. She will feel lower abdominal pain during intercourse. She will have sciatica on Lt side.      PAIN:  Are you having pain? Yes NPRS scale: 4/10 low back;  Pain location:  low back pain   Pain type: dull Pain description: aching    Aggravating factors: any activity, menstrual cycle, flexion Relieving factors: epsom salt baths, icy hot   PRECAUTIONS: None   WEIGHT BEARING RESTRICTIONS: No   FALLS:  Has patient fallen in last 6 months? 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   PLOF: Independent   PATIENT GOALS: decrease back pain and pain free intercourse; reduce tightness   PERTINENT HISTORY:  C/section Sexual abuse: No   BOWEL MOVEMENT: Pain with bowel movement: Yes - pain around the anus and in abdomen, 6/10 Type of bowel movement:Type (Bristol Stool Scale) 3 - varies in consistency, Frequency at  least 1x/day, and Strain Yes - she's trying to strain less, uses squatty potty Fully empty rectum: No Leakage: No Pads: No Fiber supplement: Noworking on eating more fiber   URINATION: Pain with urination: Yes - sometimes; gyn has ruled out infection multiple times , 6/10 Fully empty bladder: No Stream: Strong Urgency: Yes: immediately or will leak Frequency: 2-3x/hour Leakage: Urge to void, Walking to the bathroom, Laughing, and at night she will wake up wet (6-8x in the last month) Pads: No   INTERCOURSE: Pain with intercourse: Initial Penetration, During Penetration, and After Intercourse Ability to have vaginal penetration:  No Climax: normal Marinoff Scale: 0/3   PREGNANCY: Vaginal deliveries 0 Tearing No C-section deliveries 1 - she does have incisional pain and feels trauma surrounding delivery  Currently pregnant No   PROLAPSE: Feels like organs are trying to come out since delivery   OBJECTIVE:  06/05/22:             External Perineal Exam pain along superficial transverse perineal/ischiocavernosus and inferior pubic ramus                             Internal Pelvic Floor Tenderness at posterior fourchette; pain throughout Rt superficial/deep pelvic floor (Lt with mild increase in tone, but no pain   Patient confirms identification and approves PT to assess internal pelvic floor and treatment Yes    PELVIC MMT:   MMT eval  Vaginal  1/5, poor relaxation  Internal Anal Sphincter    External Anal Sphincter    Puborectalis    Diastasis Recti    (Blank rows = not tested)         TONE: High     PATIENT SURVEYS:    PFIQ-7 81   COGNITION: Overall cognitive status: Within functional limits for tasks assessed                          SENSATION: Light touch: Appears intact Proprioception: Appears intact   MUSCLE LENGTH: Hip flexor: inc low back pain on Lt; WNL Piriformis: restricted bil, pressure in lower abdomen IR: painful  on Lt in groin/lower  abdomen; WNL bil   FUNCTIONAL TESTS:              Squat: Large Rt weight shift (hurt Lt foot at work the other day)             Single leg stance: 10 sec bil, less stable on Lt             Stork: Lt stance with Rt pelvic drop              GAIT: Comments: WNL   POSTURE: forward head, increased lumbar lordosis, decreased thoracic kyphosis, anterior pelvic tilt, and Rt thoracic rotation (possible very mild Rt thoracic curvature)   LUMBARAROM/PROM:   A/PROM A/PROM  Eval (% available)  Flexion 100  Extension 50  Right lateral flexion 50  Left lateral flexion 50  Right rotation 50  Left rotation 50   (Blank rows = not tested)     LOWER EXTREMITY MMT:   MMT Right eval Left eval  Hip flexion 4 4  Hip extension 3 3  Hip abduction 4- 4-  Hip adduction 4- 4-  Hip internal rotation 4- 4-  Hip external rotation 4- 4-  Knee flexion      Knee extension      Ankle dorsiflexion      Ankle plantarflexion      Ankle inversion      Ankle eversion      *all reproduced low back pain; abd/adduction, ext, and bil hip rotation testing reproduced bil groin/pelvic pain PALPATION:   General  -1 finger width separation at umbilicus with distortion             -tenderness directly over Rt SIJ with surrounding muscle tension             -bil lumbar paraspinal tightness and tenderness from T12-L5        TODAY'S TREATMENT 06/12/22 Manual: Pt provides verbal consent for internal vaginal/rectal pelvic floor exam. Internal vaginal pelvic floor exam Rt superficia/deep pelvic floor release to tolerance Gentle P/ROM of Rt hip with internal pelvic floor muscle release  Exercises: Propped lower trunk rotations 2 x 10 Piriformis stretch 60 sec bil   TREATMENT 06/05/22 Manual: Pt provides verbal consent for internal vaginal/rectal pelvic floor exam. Internal vaginal pelvic floor exam Rt superficia/deep pelvic floor release to tolerance Gentle P/ROM of Rt hip with internal pelvic floor muscle  release  Therapeutic activities: C-section scar mobilization Regular perineal massage Squatty potty/relaxed toileting mechanics Bowel massage   TREATMENT 05/29/22: Neuromuscular re-education: Transversus abdominus training with multimodal cues for improved motor control and breath coordination Cat cow with core activation 2 x 10 Child's pose in standing bent over table 10 breaths Seated band pull down 10x Bridge with march 10x Frog bridge 10x Bridge with single arm press 10x bil, 7lbs Exercises: Sidestepping red band 3 laps 3-way kick 10x each, bil Therapeutic activities: Perineal massage      PATIENT EDUCATION:  Education details: information on dry needling, scar massage Person educated: Patient Education method: Explanation, Demonstration, Tactile cues, Verbal cues, and Handouts Education comprehension: verbalized understanding   HOME EXERCISE PROGRAM: [id number]   ASSESSMENT:   CLINICAL IMPRESSION: Pt having exacerbation of all symptoms over the last week. We returned to pelvic floor release. She had significant bil superficial and deep pelvic floor tension; Lt side released better than Rt - consistent with low back pain being worse on Rt. Red irritation present in bil labia majora/minora; pt encouraged  to ask MD about this next week in addition to any benefit from topical estrogen cream. Pt strongly encouraged to work on strengthening, stretches, breathing, and perineal massage over the next week. Pt reported good improvement in low back pain after pelvic floor muscle release today. Good tolerance to propped seated lower trunk rotation, stronger stretch felt with Lt hip IR. She will continue to benefit from skilled PT intervention in order to decrease pain, address impairments, return to intercourse without pain, and improve bowel/bladder function in order to improve QOL.    OBJECTIVE IMPAIRMENTS: decreased activity tolerance, decreased coordination, decreased endurance,  decreased strength, increased fascial restrictions, increased muscle spasms, impaired tone, postural dysfunction, and pain.    ACTIVITY LIMITATIONS: standing, squatting, sleeping, continence, and locomotion level   PARTICIPATION LIMITATIONS: interpersonal relationship, community activity, occupation, and school   PERSONAL FACTORS: 1 comorbidity: c/section  are also affecting patient's functional outcome.    REHAB POTENTIAL: Good   CLINICAL DECISION MAKING: Stable/uncomplicated   EVALUATION COMPLEXITY: Low     GOALS: Goals reviewed with patient? Yes   SHORT TERM GOALS: Target date: 05/14/22 - updated 05/22/22   Pt will be independent with HEP.    Baseline: Goal status: IN PROGRESS   2.  Pt will be able to correctly perform diaphragmatic breathing and appropriate pressure management in order to prevent worsening vaginal wall laxity and improve pelvic floor A/ROM.    Baseline:  Goal status: IN PROGRESS   3.  Pt will be independent with diaphragmatic breathing and down training activities in order to improve pelvic floor relaxation.   Baseline:  Goal status: IN PROGRESS   4.  Pt will be independent with the knack, urge suppression technique, and double voiding in order to improve bladder habits and decrease urinary incontinence.    Baseline:  Goal status: IN PROGRESS   5.  Pt will be independent with use of squatty potty, relaxed toileting mechanics, and improved bowel movement techniques in order to increase ease of bowel movements and complete evacuation.    Baseline:  Goal status: IN PROGRESS       LONG TERM GOALS: Target date: 07/09/2022 - updated 05/22/22   Pt will be independent with advanced HEP.    Baseline:  Goal status: IN PROGRESS   2.  Pt will demonstrate normal pelvic floor muscle tone and A/ROM, able to achieve 4/5 strength with contractions and 10 sec endurance, in order to provide appropriate lumbopelvic support in functional activities.    Baseline:   Goal status: IN PROGRESS   3.  Pt will be able to go 2-3 hours in between voids without urgency or incontinence in order to improve QOL and perform all functional activities with less difficulty.    Baseline:  Goal status: IN PROGRESS  4.  Pt will have regular bowel movement with normal consistent, no straining, and no pain.  Baseline:  Goal status: IN PROGRESS   5.  Pt will report 0/10 pain with vaginal penetration in order to improve intimate relationship with partner.     Baseline:  Goal status: IN PROGRESS   6.  Pt will demonstrate increase in all impaired hip strength by 1 muscle grades and bil negative stork test in order to demonstrate improved lumbopelvic support and increase functional ability.    Baseline:  Goal status: IN PROGRESS   PLAN:   PT FREQUENCY: 1-2x/week   PT DURATION: 12 weeks   PLANNED INTERVENTIONS: Therapeutic exercises, Therapeutic activity, Neuromuscular re-education, Balance training, Gait training, Patient/Family education,  Self Care, Joint mobilization, Dry Needling, Biofeedback, and Manual therapy   PLAN FOR NEXT SESSION: possible internal vaginal pelvic floor release; progress functional strengthening. Come up with set schedule for home PT intervention.    Julio Alm, PT, DPT01/24/2411:45 AM

## 2022-06-17 ENCOUNTER — Other Ambulatory Visit (HOSPITAL_COMMUNITY)
Admission: RE | Admit: 2022-06-17 | Discharge: 2022-06-17 | Disposition: A | Discharge status: Home / Self Care | Payer: Medicaid Other | Source: Ambulatory Visit | Attending: Family Medicine | Admitting: Family Medicine

## 2022-06-17 ENCOUNTER — Encounter: Payer: Self-pay | Admitting: Family Medicine

## 2022-06-17 ENCOUNTER — Ambulatory Visit: Payer: Medicaid Other | Admitting: Family Medicine

## 2022-06-17 VITALS — BP 130/77 | HR 95 | Wt 224.0 lb

## 2022-06-17 DIAGNOSIS — N926 Irregular menstruation, unspecified: Secondary | ICD-10-CM

## 2022-06-17 LAB — POCT URINE PREGNANCY: Preg Test, Ur: NEGATIVE

## 2022-06-17 NOTE — Progress Notes (Signed)
Pt following up on irregular cycle. Pt states she still has not gotten her period. She's having period symptoms, but is not bleeding. She reports "leaking" for the past 3 days. She describes it as watery with no odor.

## 2022-06-17 NOTE — Progress Notes (Signed)
GYNECOLOGY OFFICE VISIT NOTE  History:   Michele Carney is a 22 y.o. G2P1001 here today for menstrual issues. She denies any abnormal vaginal discharge, bleeding, pelvic pain or other concerns.    No periods since August 2023 (lasted 5 days regular) had 2 days of bleeding in October and some spotting on thanksgiving. Pelvic floor therapy ongoing for dyspareunia, back pain, irregular BM. Was also told that her pelvic floor was collapsed. Current pelvic pain for the last 2-3 days. Watery d/c past 3 days and vaginal irritation. Fam h/o ovarian cancer, great grandmother maternal at age 51, maternal great grandfather died colon cancer in 44s. No family h/o T2DM. Positive fam h/o HTN by father. Mother has PCOS, its notable that she had heavy periods and underwent hysterectomy.   Weight changes: Started gaining wt in August, was 216 lbs and now 224lbs. No change in habits of eating or physical activity.  Skin changes: more rashes under arms and in groin area for 1 year. Described as dry red spots that come and go. She did go hiking in October 2023, the only time she went hiking. She was completely covered with clothes except for her hands and face.   Mood: more irritable, more emotionally labile, and more general fatigue more than usual even with 6-7 hours of nightly sleep. This started in August 2023.  Past Medical History:  Diagnosis Date   Anxiety    Depression     Past Surgical History:  Procedure Laterality Date   CESAREAN SECTION N/A 02/08/2020   Procedure: CESAREAN SECTION;  Surgeon: Florian Buff, MD;  Location: [facility];  Service: Obstetrics;  Laterality: N/A;    The following portions of the patient's history were reviewed and updated as appropriate: allergies, current medications, past family history, past medical history, past social history, past surgical history and problem list.   Health Maintenance:  Normal pap and negative HRHPV on 2021.  Normal mammogram on n/a.    Review of Systems:  Pertinent items noted in HPI and remainder of comprehensive ROS otherwise negative.  Physical Exam:  BP 130/77   Pulse 95   Wt 224 lb (101.6 kg)   LMP 04/11/2022 (Exact Date)   BMI 40.97 kg/m  CONSTITUTIONAL: Well-developed, well-nourished female in no acute distress.  HEENT:  Normocephalic, atraumatic. External right and left ear normal. No scleral icterus.  NECK: Normal range of motion, supple, no masses noted on observation SKIN: No rash noted. Not diaphoretic. No erythema. No pallor. MUSCULOSKELETAL: Normal range of motion. No edema noted. NEUROLOGIC: Alert and oriented to person, place, and time. Normal muscle tone coordination.  PSYCHIATRIC: Normal mood and affect. Normal behavior. Normal judgment and thought content. CARDIOVASCULAR: Normal heart rate noted RESPIRATORY: Effort and breath sounds normal, no problems with respiration noted ABDOMEN: No masses noted. No other overt distention noted.   PELVIC: Normal appearing external genitalia; normal urethral meatus; normal appearing vaginal mucosa and cervix.  No abnormal discharge noted.  Normal uterine size, no other palpable masses, no uterine or adnexal tenderness. Performed in the presence of a chaperone  Labs and Imaging Results for orders placed or performed in visit on 06/17/22 (from the past 168 hour(s))  POCT urine pregnancy   Collection Time: 06/17/22 10:29 AM  Result Value Ref Range   Preg Test, Ur Negative Negative  Cervicovaginal ancillary only   Collection Time: 06/17/22 10:43 AM  Result Value Ref Range   Neisseria Gonorrhea Negative    Chlamydia Negative  Trichomonas Negative    Bacterial Vaginitis (gardnerella) Negative    Candida Vaginitis Positive (A)    Candida Glabrata Negative    Comment Normal Reference Range Candida Species - Negative    Comment Normal Reference Range Candida Galbrata - Negative    Comment Normal Reference Range Trichomonas - Negative    Comment Normal  Reference Ranger Chlamydia - Negative    Comment      Normal Reference Range Neisseria Gonorrhea - Negative   Comment      Normal Reference Range Bacterial Vaginosis - Negative  Prolactin   Collection Time: 06/17/22 10:47 AM  Result Value Ref Range   Prolactin 17.7 4.8 - 33.4 ng/mL  Estradiol   Collection Time: 06/17/22 10:47 AM  Result Value Ref Range   Estradiol 39.0 pg/mL  Follicle stimulating hormone   Collection Time: 06/17/22 10:47 AM  Result Value Ref Range   FSH 8.4 mIU/mL  TSH + free T4   Collection Time: 06/17/22 10:47 AM  Result Value Ref Range   TSH 0.964 0.450 - 4.500 uIU/mL   Free T4 1.10 0.82 - 1.77 ng/dL  HIV antibody (with reflex)   Collection Time: 06/17/22 10:47 AM  Result Value Ref Range   HIV Screen 4th Generation wRfx Non Reactive Non Reactive   No results found.    Assessment and Plan:      1. Irregular periods/menstrual cycles - POCT urine pregnancy - Prolactin - Estradiol - Follicle stimulating hormone - TSH + free T4 - US PELVIC COMPLETE WITH TRANSVAGINAL; Future - Cervicovaginal ancillary only - HIV antibody (with reflex)  Pt with irregular periods, neg UPT. Labs and [place] ordered. Suspect this is more anovularoty bleeding 2/2 wt, skin, mood and period changes, but will f/up w/ lab work.    Routine preventative health maintenance measures emphasized. Please refer to After Visit Summary for other counseling recommendations.   Shelda Pal, DO OB Fellow, [place] [facility] [place] 06/20/2022 2:31 PM

## 2022-06-18 ENCOUNTER — Other Ambulatory Visit: Payer: Self-pay | Admitting: Family Medicine

## 2022-06-18 DIAGNOSIS — B3731 Acute candidiasis of vulva and vagina: Secondary | ICD-10-CM

## 2022-06-18 LAB — CERVICOVAGINAL ANCILLARY ONLY
Bacterial Vaginitis (gardnerella): NEGATIVE
Candida Glabrata: NEGATIVE
Candida Vaginitis: POSITIVE — AB
Chlamydia: NEGATIVE
Comment: NEGATIVE
Comment: NEGATIVE
Comment: NEGATIVE
Comment: NEGATIVE
Comment: NEGATIVE
Comment: NORMAL
Neisseria Gonorrhea: NEGATIVE
Trichomonas: NEGATIVE

## 2022-06-18 LAB — PROLACTIN: Prolactin: 17.7 ng/mL (ref 4.8–33.4)

## 2022-06-18 LAB — HIV ANTIBODY (ROUTINE TESTING W REFLEX): HIV Screen 4th Generation wRfx: NONREACTIVE

## 2022-06-18 LAB — FOLLICLE STIMULATING HORMONE: FSH: 8.4 m[IU]/mL

## 2022-06-18 LAB — TSH+FREE T4
Free T4: 1.1 ng/dL (ref 0.82–1.77)
TSH: 0.964 u[IU]/mL (ref 0.450–4.500)

## 2022-06-18 LAB — ESTRADIOL: Estradiol: 75.5 pg/mL

## 2022-06-18 MED ORDER — FLUCONAZOLE 150 MG PO TABS: 150.0000 mg | ORAL_TABLET | ORAL | 0 refills | Status: AC

## 2022-06-19 ENCOUNTER — Ambulatory Visit: Payer: Medicaid Other

## 2022-06-19 DIAGNOSIS — M62838 Other muscle spasm: Secondary | ICD-10-CM

## 2022-06-19 DIAGNOSIS — R293 Abnormal posture: Secondary | ICD-10-CM | POA: Diagnosis not present

## 2022-06-19 DIAGNOSIS — R279 Unspecified lack of coordination: Secondary | ICD-10-CM

## 2022-06-19 DIAGNOSIS — M6281 Muscle weakness (generalized): Secondary | ICD-10-CM

## 2022-06-19 DIAGNOSIS — R102 Pelvic and perineal pain: Secondary | ICD-10-CM

## 2022-06-19 DIAGNOSIS — R278 Other lack of coordination: Secondary | ICD-10-CM

## 2022-06-19 DIAGNOSIS — M5459 Other low back pain: Secondary | ICD-10-CM

## 2022-06-19 NOTE — Therapy (Signed)
OUTPATIENT PHYSICAL THERAPY TREATMENT NOTE   Patient Name: Michele Carney MRN: [id number] DOB:04/12/2001, 22 y.o., female Today's Date: 06/19/2022  PCP: Everrett Coombe, DO  REFERRING PROVIDER: Corlis Hove, NP   END OF SESSION:   PT End of Session - 06/19/22 1109     Visit Number 7    Date for PT Re-Evaluation 07/09/22    Authorization Type [employer]    Authorization Time Period 11/28-2/26    Authorization - Visit Number 6    Authorization - Number of Visits 13    PT Start Time 1103    PT Stop Time 1143    PT Time Calculation (min) 40 min    Activity Tolerance Patient tolerated treatment well    Behavior During Therapy WFL for tasks assessed/performed                 Past Medical History:  Diagnosis Date   Anxiety    Depression    Past Surgical History:  Procedure Laterality Date   CESAREAN SECTION N/A 02/08/2020   Procedure: CESAREAN SECTION;  Surgeon: Lazaro Arms, MD;  Location: [facility];  Service: Obstetrics;  Laterality: N/A;   Patient Active Problem List   Diagnosis Date Noted   Well adult exam 03/07/2022   Acute maxillary sinusitis 03/07/2022   Chronic bilateral low back pain without sciatica 11/30/2020   Iron deficiency anemia 02/10/2020   Cesarean delivery delivered 02/08/2020   Non-reactive NST (non-stress test) 02/06/2020   Significant discrepancy between uterine size and clinical dates, antepartum, second trimester 11/16/2019   Yeast vaginitis 10/14/2019   GBS (group B streptococcus) UTI complicating pregnancy 08/31/2019   Supervision of normal first pregnancy, antepartum 07/05/2019   MDD (major depressive disorder) 05/25/2019   Chronic migraine without aura without status migrainosus, not intractable 12/30/2017   REFERRING DIAG: M62.89 (ICD-10-CM) - Pelvic floor dysfunction in female N94.10 (ICD-10-CM) - Dyspareunia in female R35.0 (ICD-10-CM) - Urinary frequency   THERAPY DIAG:  Abnormal posture   Muscle  weakness (generalized)   Other muscle spasm   Unspecified lack of coordination   Other low back pain   Pelvic pain   Rationale for Evaluation and Treatment: Rehabilitation   ONSET DATE: 02/08/20   SUBJECTIVE: Pt currently has 6/10 low back pain. She has been to see MD and states that they are considering other conditions that may be causing pain and abnormal cycles. She has been doing more c-section scar tissue mobilization. She does not notice that exercises have helped improve pain at all.  04/16/22 SUBJECTIVE STATEMENT: Pt states that low back pain started 2 years ago after having daughter and it seems to get worse around her cycle. She has had pain with intercourse that started around this time as well - been consistently bad since she had a yeast infection this past May. She will feel lower abdominal pain during intercourse. She will have sciatica on Lt side.      PAIN:  Are you having pain? Yes NPRS scale: 4/10 low back;  Pain location:  low back pain   Pain type: dull Pain description: aching    Aggravating factors: any activity, menstrual cycle, flexion Relieving factors: epsom salt baths, icy hot   PRECAUTIONS: None   WEIGHT BEARING RESTRICTIONS: No   FALLS:  Has patient fallen in last 6 months? 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   PLOF: Independent   PATIENT GOALS: decrease back pain and pain free intercourse; reduce tightness   PERTINENT HISTORY:  C/section Sexual abuse: No   BOWEL MOVEMENT: Pain with bowel movement: Yes - pain around the anus and in abdomen, 6/10 Type of bowel movement:Type (Bristol Stool Scale) 3 - varies in  consistency, Frequency at least 1x/day, and Strain Yes - she's trying to strain less, uses squatty potty Fully empty rectum: No Leakage: No Pads: No Fiber supplement: Noworking on eating more fiber   URINATION: Pain with urination: Yes - sometimes; gyn has ruled out infection multiple times , 6/10 Fully empty bladder: No Stream: Strong Urgency: Yes: immediately or will leak Frequency: 2-3x/hour Leakage: Urge to void, Walking to the bathroom, Laughing, and at night she will wake up wet (6-8x in the last month) Pads: No   INTERCOURSE: Pain with intercourse: Initial Penetration, During Penetration, and After Intercourse Ability to have vaginal penetration:  No Climax: normal Marinoff Scale: 0/3   PREGNANCY: Vaginal deliveries 0 Tearing No C-section deliveries 1 - she does have incisional pain and feels trauma surrounding delivery  Currently pregnant No   PROLAPSE: Feels like organs are trying to come out since delivery   OBJECTIVE:  06/05/22:             External Perineal Exam pain along superficial transverse perineal/ischiocavernosus and inferior pubic ramus                             Internal Pelvic Floor Tenderness at posterior fourchette; pain throughout Rt superficial/deep pelvic floor (Lt with mild increase in tone, but no pain   Patient confirms identification and approves PT to assess internal pelvic floor and treatment Yes    PELVIC MMT:   MMT eval  Vaginal  1/5, poor relaxation  Internal Anal Sphincter    External Anal Sphincter    Puborectalis    Diastasis Recti    (Blank rows = not tested)         TONE: High     PATIENT SURVEYS:    PFIQ-7 81   COGNITION: Overall cognitive status: Within functional limits for tasks assessed                          SENSATION: Light touch: Appears intact Proprioception: Appears intact   MUSCLE LENGTH: Hip flexor: inc low back pain on Lt; WNL Piriformis: restricted bil, pressure in lower abdomen IR: painful  on  Lt in groin/lower abdomen; WNL bil   FUNCTIONAL TESTS:              Squat: Large Rt weight shift (hurt Lt foot at work the other day)             Single leg stance: 10 sec bil, less stable on Lt             Stork: Lt stance with Rt pelvic drop              GAIT: Comments: WNL   POSTURE: forward head, increased lumbar lordosis, decreased thoracic kyphosis, anterior pelvic tilt, and Rt thoracic rotation (possible very mild Rt thoracic curvature)   LUMBARAROM/PROM:   A/PROM A/PROM  Eval (% available)  Flexion 100  Extension 50  Right lateral flexion 50  Left lateral flexion 50  Right rotation 50  Left rotation 50   (Blank rows = not tested)     LOWER EXTREMITY MMT:   MMT Right eval Left eval  Hip flexion 4 4  Hip extension 3 3  Hip abduction 4- 4-  Hip adduction 4- 4-  Hip internal rotation 4- 4-  Hip external rotation 4- 4-  Knee flexion      Knee extension      Ankle dorsiflexion      Ankle plantarflexion      Ankle inversion      Ankle eversion      *all reproduced low back pain; abd/adduction, ext, and bil hip rotation testing reproduced bil groin/pelvic pain PALPATION:   General  -1 finger width separation at umbilicus with distortion             -tenderness directly over Rt SIJ with surrounding muscle tension             -bil lumbar paraspinal tightness and tenderness from T12-L5        TODAY'S TREATMENT 06/19/22 Manual: Trigger Point Dry-Needling  Treatment instructions: Expect mild to moderate muscle soreness. S/S of pneumothorax if dry needled over a lung field, and to seek immediate medical attention should they occur. Patient verbalized understanding of these instructions and education.  Patient Consent Given: Yes Education handout provided: Previously provided Muscles treated: Bil lumbar paraspinals and Rt glutes Electrical stimulation performed: No Parameters: N/A Treatment response/outcome: release of trigger points Negative pressure soft  tissue  mobilization Myofascial release Exercises: Hip 3-way 10x ea, bil Lower trunk rotation in propped seated position 10x Therapeutic activities: HEP consolidated/review Squats with anteiror weight hold to table, 2 x 10, 3 lbs Pallof press 2 x 10 bil green band Seated D2 lift, 2 x 10 bil, 3 lbs weight   TREATMENT 06/12/22 Manual: Pt provides verbal consent for internal vaginal/rectal pelvic floor exam. Internal vaginal pelvic floor exam Rt superficia/deep pelvic floor release to tolerance Gentle P/ROM of Rt hip with internal pelvic floor muscle release  Exercises: Propped lower trunk rotations 2 x 10 Piriformis stretch 60 sec bil   TREATMENT 06/05/22 Manual: Pt provides verbal consent for internal vaginal/rectal pelvic floor exam. Internal vaginal pelvic floor exam Rt superficia/deep pelvic floor release to tolerance Gentle P/ROM of Rt hip with internal pelvic floor muscle release  Therapeutic activities: C-section scar mobilization Regular perineal massage Squatty potty/relaxed toileting mechanics Bowel massage      PATIENT EDUCATION:  Education details: information on dry needling, scar massage Person educated: Patient Education method: Explanation, Demonstration, Tactile cues, Verbal cues, and Handouts Education comprehension: verbalized understanding   HOME EXERCISE PROGRAM:  [id number]   ASSESSMENT:   CLINICAL IMPRESSION: Believe there is pelvic floor restriction that is impacting low back pain, but due to significant localized restriction in this area, manual techniques to low back were performed today. We did HEP consolidated and organization today with 3 days of variety that she is supposed to do each 2x/week with 1 rest day. She is agreeable to this plan, performing no more than 10 minutes each day of PT. Excellent tolerance to DN and manual techniques performed today with decrease in low back pain.  She will continue to benefit from skilled PT intervention in order  to decrease pain, address impairments, return to intercourse without pain, and improve bowel/bladder function in order to improve QOL.    OBJECTIVE IMPAIRMENTS: decreased activity tolerance, decreased coordination, decreased endurance, decreased strength, increased fascial restrictions, increased muscle spasms, impaired tone, postural dysfunction, and pain.    ACTIVITY LIMITATIONS: standing, squatting, sleeping, continence, and locomotion level   PARTICIPATION LIMITATIONS: interpersonal relationship, community activity, occupation, and school   PERSONAL FACTORS: 1 comorbidity: c/section  are also affecting patient's functional outcome.    REHAB POTENTIAL: Good   CLINICAL DECISION MAKING: Stable/uncomplicated   EVALUATION COMPLEXITY: Low     GOALS: Goals reviewed with patient? Yes   SHORT TERM GOALS: Target date: 05/14/22 - updated 05/22/22   Pt will be independent with HEP.    Baseline: Goal status: IN PROGRESS   2.  Pt will be able to correctly perform diaphragmatic breathing and appropriate pressure management in order to prevent worsening vaginal wall laxity and improve pelvic floor A/ROM.    Baseline:  Goal status: IN PROGRESS   3.  Pt will be independent with diaphragmatic breathing and down training activities in order to improve pelvic floor relaxation.   Baseline:  Goal status: IN PROGRESS   4.  Pt will be independent with the knack, urge suppression technique, and double voiding in order to improve bladder habits and decrease urinary incontinence.    Baseline:  Goal status: IN PROGRESS   5.  Pt will be independent with use of squatty potty, relaxed toileting mechanics, and improved bowel movement techniques in order to increase ease of bowel movements and complete evacuation.    Baseline:  Goal status: IN PROGRESS       LONG TERM GOALS: Target date: 07/09/2022 - updated 05/22/22   Pt will be independent with advanced HEP.    Baseline:  Goal status: IN  PROGRESS   2.  Pt will demonstrate normal pelvic floor muscle tone and A/ROM, able to achieve 4/5 strength with contractions and 10 sec endurance, in order to provide appropriate lumbopelvic support in functional activities.    Baseline:  Goal status: IN PROGRESS   3.  Pt will be able to go 2-3 hours in between voids without urgency or incontinence in order to improve QOL and perform all functional activities with less difficulty.    Baseline:  Goal status: IN PROGRESS  4.  Pt will have regular bowel movement with normal consistent, no straining, and no pain.  Baseline:  Goal status: IN PROGRESS   5.  Pt will report 0/10 pain with vaginal penetration in order to improve intimate relationship with partner.     Baseline:  Goal status: IN PROGRESS   6.  Pt will demonstrate increase in all impaired hip strength by 1 muscle grades and bil negative stork test in order to demonstrate improved lumbopelvic support and increase functional ability.    Baseline:  Goal status:  IN PROGRESS   PLAN:   PT FREQUENCY: 1-2x/week   PT DURATION: 12 weeks   PLANNED INTERVENTIONS: Therapeutic exercises, Therapeutic activity, Neuromuscular re-education, Balance training, Gait training, Patient/Family education, Self Care, Joint mobilization, Dry Needling, Biofeedback, and Manual therapy   PLAN FOR NEXT SESSION: possible internal vaginal pelvic floor release; progress functional strengthening.   Heather Roberts, PT, DPT01/31/2411:44 AM

## 2022-06-24 ENCOUNTER — Ambulatory Visit (HOSPITAL_BASED_OUTPATIENT_CLINIC_OR_DEPARTMENT_OTHER)
Admission: RE | Admit: 2022-06-24 | Discharge: 2022-06-24 | Disposition: A | Discharge status: Home / Self Care | Payer: Medicaid Other | Source: Ambulatory Visit | Attending: Family Medicine | Admitting: Family Medicine

## 2022-06-24 DIAGNOSIS — N926 Irregular menstruation, unspecified: Secondary | ICD-10-CM | POA: Diagnosis not present

## 2022-06-26 ENCOUNTER — Ambulatory Visit: Payer: Medicaid Other

## 2022-06-27 ENCOUNTER — Ambulatory Visit
Admission: EM | Admit: 2022-06-27 | Discharge: 2022-06-27 | Disposition: A | Discharge status: Home / Self Care | Payer: Medicaid Other

## 2022-06-27 ENCOUNTER — Encounter: Payer: Self-pay | Admitting: Emergency Medicine

## 2022-06-27 DIAGNOSIS — J0100 Acute maxillary sinusitis, unspecified: Secondary | ICD-10-CM

## 2022-06-27 DIAGNOSIS — H65192 Other acute nonsuppurative otitis media, left ear: Secondary | ICD-10-CM | POA: Diagnosis not present

## 2022-06-27 MED ORDER — AMOXICILLIN-POT CLAVULANATE 875-125 MG PO TABS: 1.0000 | ORAL_TABLET | Freq: Two times a day (BID) | ORAL | 0 refills | Status: DC

## 2022-06-27 NOTE — ED Provider Notes (Signed)
[facility]    CSN: [id number] Arrival date & time: 06/27/22  1545      History   Chief Complaint Chief Complaint  Patient presents with   Facial Pain   Ear Fullness    HPI Michele Carney is a 22 y.o. female.   Patient here today for evaluation of sinus congestion and sinus pressure that is been ongoing for the last week.  She reports she is also having some pressure to her left ear.  She has had some mild cough.  She denies any vomiting or diarrhea.  She does not report fever.  She has not taken any medication for symptoms.  The history is provided by the patient.  Ear Fullness Pertinent negatives include no abdominal pain and no shortness of breath.    Past Medical History:  Diagnosis Date   Anxiety    Depression     Patient Active Problem List   Diagnosis Date Noted   Well adult exam 03/07/2022   Acute maxillary sinusitis 03/07/2022   Chronic bilateral low back pain without sciatica 11/30/2020   Iron deficiency anemia 02/10/2020   Cesarean delivery delivered 02/08/2020   Non-reactive NST (non-stress test) 02/06/2020   Significant discrepancy between uterine size and clinical dates, antepartum, second trimester 11/16/2019   Yeast vaginitis 10/14/2019   GBS (group B streptococcus) UTI complicating pregnancy 40/98/1191   Supervision of normal first pregnancy, antepartum 07/05/2019   MDD (major depressive disorder) 05/25/2019   Chronic migraine without aura without status migrainosus, not intractable 12/30/2017    Past Surgical History:  Procedure Laterality Date   CESAREAN SECTION N/A 02/08/2020   Procedure: CESAREAN SECTION;  Surgeon: Florian Buff, MD;  Location: [facility] LD ORS;  Service: Obstetrics;  Laterality: N/A;    OB History     Gravida  2   Para  1   Term  1   Preterm      AB      Living  1      SAB      IAB      Ectopic      Multiple  0   Live Births  1            Home Medications    Prior to Admission  medications   Medication Sig Start Date End Date Taking? Authorizing Provider  amoxicillin-clavulanate (AUGMENTIN) 875-125 MG tablet Take 1 tablet by mouth every 12 (twelve) hours. 06/27/22  Yes Francene Finders, PA-C  fluconazole (DIFLUCAN) 150 MG tablet Take 150 mg by mouth daily.   Yes [provider]  ibuprofen (ADVIL) 600 MG tablet Take 1 tablet (600 mg total) by mouth 3 (three) times daily. Patient not taking: Reported on 03/07/2022 02/05/22   Nyoka Lint, PA-C  lidocaine (XYLOCAINE) 2 % jelly Apply 1 application topically 3 (three) times daily. Patient not taking: Reported on 01/09/2022 03/19/21   Renee Harder, CNM    Family History Family History  Problem Relation Age of Onset   Diabetes Maternal Grandmother    Cancer Maternal Grandfather        colon   Colon cancer Maternal Grandfather     Social History Social History   Tobacco Use   Smoking status: Never    Passive exposure: Never   Smokeless tobacco: Never  Vaping Use   Vaping Use: Never used  Substance Use Topics   Alcohol use: Not Currently    Alcohol/week: 0.0 - 1.0 standard drinks of alcohol   Drug  use: Not Currently    Types: Marijuana    Comment: occasionally. last smoked 2 wks ago     Allergies   Shellfish allergy   Review of Systems Review of Systems  Constitutional:  Negative for chills and fever.  HENT:  Positive for congestion, ear pain and sinus pressure.   Eyes:  Negative for discharge and redness.  Respiratory:  Positive for cough. Negative for shortness of breath and wheezing.   Gastrointestinal:  Negative for abdominal pain, diarrhea, nausea and vomiting.     Physical Exam Triage Vital Signs ED Triage Vitals  Enc Vitals Group     BP 06/27/22 1615 112/77     Pulse Rate 06/27/22 1615 96     Resp 06/27/22 1615 17     Temp 06/27/22 1615 98.5 F (36.9 C)     Temp Source 06/27/22 1615 Oral     SpO2 06/27/22 1615 97 %     Weight --      Height --      Head Circumference  --      Peak Flow --      Pain Score 06/27/22 1613 0     Pain Loc --      Pain Edu? --      Excl. in [place]? --    No data found.  Updated Vital Signs BP 112/77 (BP Location: Left Arm)   Pulse 96   Temp 98.5 F (36.9 C) (Oral)   Resp 17   LMP 04/11/2022 (Exact Date)   SpO2 97%     Physical Exam Vitals and nursing note reviewed.  Constitutional:      General: She is not in acute distress.    Appearance: Normal appearance. She is not ill-appearing.  HENT:     Head: Normocephalic and atraumatic.     Right Ear: Tympanic membrane normal.     Ears:     Comments: Left TM injected    Nose: Congestion present.     Mouth/Throat:     Mouth: Mucous membranes are moist.     Pharynx: No oropharyngeal exudate or posterior oropharyngeal erythema.  Eyes:     Conjunctiva/sclera: Conjunctivae normal.  Cardiovascular:     Rate and Rhythm: Normal rate and regular rhythm.     Heart sounds: Normal heart sounds. No murmur heard. Pulmonary:     Effort: Pulmonary effort is normal. No respiratory distress.     Breath sounds: Normal breath sounds. No wheezing, rhonchi or rales.  Skin:    General: Skin is warm and dry.  Neurological:     Mental Status: She is alert.  Psychiatric:        Mood and Affect: Mood normal.        Thought Content: Thought content normal.      UC Treatments / Results  Labs (all labs ordered are listed, but only abnormal results are displayed) Labs Reviewed - No data to display  EKG   Radiology No results found.  Procedures Procedures (including critical care time)  Medications Ordered in UC Medications - No data to display  Initial Impression / Assessment and Plan / UC Course  I have reviewed the triage vital signs and the nursing notes.  Pertinent labs & imaging results that were available during my care of the patient were reviewed by me and considered in my medical decision making (see chart for details).    Augmentin prescribed to cover both  sinusitis as well as otitis media.  Recommend symptomatic treatment otherwise,  increase fluids and rest.  Encourage follow-up with any further concerns.  Final Clinical Impressions(s) / UC Diagnoses   Final diagnoses:  Acute maxillary sinusitis, recurrence not specified  Other acute nonsuppurative otitis media of left ear, recurrence not specified   Discharge Instructions   None    ED Prescriptions     Medication Sig Dispense Auth. Provider   amoxicillin-clavulanate (AUGMENTIN) 875-125 MG tablet Take 1 tablet by mouth every 12 (twelve) hours. 14 tablet Francene Finders, PA-C      PDMP not reviewed this encounter.   Francene Finders, PA-C 06/27/22 [phone]

## 2022-06-27 NOTE — ED Triage Notes (Signed)
Pt c/o sinus congestion, sinus pain and left ear pressure for about a week. Denies taking medications for symptoms

## 2022-07-03 ENCOUNTER — Ambulatory Visit: Payer: Medicaid Other | Attending: Student

## 2022-07-03 DIAGNOSIS — R293 Abnormal posture: Secondary | ICD-10-CM | POA: Insufficient documentation

## 2022-07-03 DIAGNOSIS — R102 Pelvic and perineal pain: Secondary | ICD-10-CM | POA: Insufficient documentation

## 2022-07-03 DIAGNOSIS — M6281 Muscle weakness (generalized): Secondary | ICD-10-CM | POA: Insufficient documentation

## 2022-07-03 DIAGNOSIS — M62838 Other muscle spasm: Secondary | ICD-10-CM | POA: Diagnosis present

## 2022-07-03 DIAGNOSIS — M5459 Other low back pain: Secondary | ICD-10-CM | POA: Insufficient documentation

## 2022-07-03 DIAGNOSIS — R279 Unspecified lack of coordination: Secondary | ICD-10-CM | POA: Insufficient documentation

## 2022-07-03 NOTE — Therapy (Signed)
OUTPATIENT PHYSICAL THERAPY TREATMENT NOTE   Patient Name: Michele Carney MRN: [id number] DOB:[birth year], 22 y.o., female Today's Date: 07/03/2022  PCP: Luetta Nutting, DO  REFERRING PROVIDER: Johnston Ebbs, NP   END OF SESSION:   PT End of Session - 07/03/22 1100     Visit Number 8    Date for PT Re-Evaluation 07/09/22    Authorization Type [employer]    Authorization Time Period 11/28-2/26    Authorization - Visit Number 7    Authorization - Number of Visits 13    PT Start Time 1100    PT Stop Time 1225    PT Time Calculation (min) 85 min    Activity Tolerance Patient tolerated treatment well    Behavior During Therapy WFL for tasks assessed/performed                  Past Medical History:  Diagnosis Date   Anxiety    Depression    Past Surgical History:  Procedure Laterality Date   CESAREAN SECTION N/A 02/08/2020   Procedure: CESAREAN SECTION;  Surgeon: Florian Buff, MD;  Location: [facility];  Service: Obstetrics;  Laterality: N/A;   Patient Active Problem List   Diagnosis Date Noted   Well adult exam 03/07/2022   Acute maxillary sinusitis 03/07/2022   Chronic bilateral low back pain without sciatica 11/30/2020   Iron deficiency anemia 02/10/2020   Cesarean delivery delivered 02/08/2020   Non-reactive NST (non-stress test) 02/06/2020   Significant discrepancy between uterine size and clinical dates, antepartum, second trimester 11/16/2019   Yeast vaginitis 10/14/2019   GBS (group B streptococcus) UTI complicating pregnancy [id number]   Supervision of normal first pregnancy, antepartum 07/05/2019   MDD (major depressive disorder) 05/25/2019   Chronic migraine without aura without status migrainosus, not intractable 12/30/2017   REFERRING DIAG: M62.89 (ICD-10-CM) - Pelvic floor dysfunction in female N94.10 (ICD-10-CM) - Dyspareunia in female R35.0 (ICD-10-CM) - Urinary frequency   THERAPY DIAG:  Abnormal posture   Muscle  weakness (generalized)   Other muscle spasm   Unspecified lack of coordination   Other low back pain   Pelvic pain   Rationale for Evaluation and Treatment: Rehabilitation   ONSET DATE: 02/08/20   SUBJECTIVE: Pt states that her back felt better for about a day after last treatment session and has been bothering her since. Pt states that she had big leak while sleeping the other night. She has been wearing a pad all day every day due to leaking and urgency. She has been working on [employer] regularly and performing regular relaxation breaks. She states that she is having moodiness, cramping, weight gain, nausea and is frustrated about all of these symptoms. She is not seeing a mental health counselor at this time. MD told her that she does not have PCOS and hormone screen appears normal (testosterone tested?)  04/16/22 SUBJECTIVE STATEMENT: Pt states that low back pain started 2 years ago after having daughter and it seems to get worse around her cycle. She has had pain with intercourse that started around this time as well - been consistently bad since she had a yeast infection this past May. She will feel lower abdominal pain during intercourse. She will have sciatica on Lt side.      PAIN:  Are you having pain? Yes NPRS scale: 4/10 low back;  Pain location:  low back pain   Pain type: dull Pain description: aching    Aggravating factors: any activity, menstrual cycle, flexion Relieving factors: epsom salt baths, icy hot   PRECAUTIONS: None   WEIGHT BEARING RESTRICTIONS: No   FALLS:  Has patient fallen in last 6 months? 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    PLOF: Independent   PATIENT GOALS: decrease back pain and pain free intercourse; reduce tightness   PERTINENT HISTORY:  C/section Sexual abuse: No   BOWEL MOVEMENT: Pain with bowel movement: Yes - pain around the anus and in abdomen, 6/10 Type of bowel movement:Type (Bristol Stool Scale) 3 - varies in consistency, Frequency at least 1x/day, and Strain Yes - she's trying to strain less, uses squatty potty Fully empty rectum: No Leakage: No Pads: No Fiber supplement: Noworking on eating more fiber   URINATION: Pain with urination: Yes - sometimes; gyn has ruled out infection multiple times , 6/10 Fully empty bladder: No Stream: Strong Urgency: Yes: immediately or will leak Frequency: 2-3x/hour Leakage: Urge to void, Walking to the bathroom, Laughing, and at night she will wake up wet (6-8x in the last month) Pads: No   INTERCOURSE: Pain with intercourse: Initial Penetration, During Penetration, and After Intercourse Ability to have vaginal penetration:  No Climax: normal Marinoff Scale: 0/3   PREGNANCY: Vaginal deliveries 0 Tearing No C-section deliveries 1 - she does have incisional pain and feels trauma surrounding delivery  Currently pregnant No   PROLAPSE: Feels like organs are trying to come out since delivery   OBJECTIVE:  06/05/22:             External Perineal Exam pain along superficial transverse perineal/ischiocavernosus and inferior pubic ramus                             Internal Pelvic Floor Tenderness at posterior fourchette; pain throughout Rt superficial/deep pelvic floor (Lt with mild increase in tone, but no pain   Patient confirms identification and approves PT to assess internal pelvic floor and treatment Yes    PELVIC MMT:   MMT eval  Vaginal  1/5, poor relaxation  Internal Anal Sphincter    External Anal Sphincter    Puborectalis    Diastasis Recti    (Blank rows = not tested)         TONE: High     PATIENT SURVEYS:    PFIQ-7 81    COGNITION: Overall cognitive status: Within functional limits for tasks assessed                          SENSATION: Light touch: Appears intact Proprioception: Appears intact   MUSCLE LENGTH: Hip flexor: inc low back pain on Lt; WNL Piriformis: restricted bil, pressure in lower abdomen IR:  painful on Lt in groin/lower abdomen; WNL bil   FUNCTIONAL TESTS:              Squat: Large Rt weight shift (hurt Lt foot at work the other day)             Single leg stance: 10 sec bil, less stable on Lt             Stork: Lt stance with Rt pelvic drop              GAIT: Comments: WNL   POSTURE: forward head, increased lumbar lordosis, decreased thoracic kyphosis, anterior pelvic tilt, and Rt thoracic rotation (possible very mild Rt thoracic curvature)   LUMBARAROM/PROM:   A/PROM A/PROM  Eval (% available)  Flexion 100  Extension 50  Right lateral flexion 50  Left lateral flexion 50  Right rotation 50  Left rotation 50   (Blank rows = not tested)     LOWER EXTREMITY MMT:   MMT Right eval Left eval  Hip flexion 4 4  Hip extension 3 3  Hip abduction 4- 4-  Hip adduction 4- 4-  Hip internal rotation 4- 4-  Hip external rotation 4- 4-  Knee flexion      Knee extension      Ankle dorsiflexion      Ankle plantarflexion      Ankle inversion      Ankle eversion      *all reproduced low back pain; abd/adduction, ext, and bil hip rotation testing reproduced bil groin/pelvic pain PALPATION:   General  -1 finger width separation at umbilicus with distortion             -tenderness directly over Rt SIJ with surrounding muscle tension             -bil lumbar paraspinal tightness and tenderness from T12-L5        TODAY'S TREATMENT 07/03/22 Manual: Pt provides verbal consent for internal vaginal/rectal pelvic floor exam. Bil internal vaginal deep pelvic floor release Trigger Point Dry-Needling  Treatment instructions: Expect mild to moderate muscle soreness. S/S of pneumothorax  if dry needled over a lung field, and to seek immediate medical attention should they occur. Patient verbalized understanding of these instructions and education.  Patient Consent Given: Yes Education handout provided: Previously provided Muscles treated: Bil lumbar paraspinals and Rt glutes Electrical stimulation performed: No Parameters: N/A Treatment response/outcome: release of trigger points Myofascial release lumbar paraspinals Neuromuscular re-education: Transversus abdominus training with multimodal cues for improved motor control and breath coordination Pelvic tilts 2 x 10 Therapeutic activities: Mental health counselor Plan of care HEP review - specific focus on deep core stabilization - ways to incorporate deep stabilizing exercises into dialy life and work in seated and standing positions *Extra time spent with patient this session due to cancellation afterwards and patient having time to stay for longer treatment  TREATMENT 06/19/22 Manual: Trigger Point Dry-Needling  Treatment instructions: Expect mild to moderate muscle soreness. S/S of pneumothorax if dry needled over a lung field, and to seek immediate medical attention should they occur. Patient verbalized understanding of these instructions and education.  Patient Consent Given: Yes Education handout provided: Previously provided Muscles treated: Bil lumbar paraspinals and Rt glutes Electrical stimulation performed: No Parameters: N/A Treatment response/outcome: release of trigger points Negative pressure soft  tissue mobilization Myofascial release Exercises: Hip 3-way 10x ea, bil Lower trunk rotation in propped seated position 10x Therapeutic activities: HEP consolidated/review Squats with anteiror weight hold  to table, 2 x 10, 3 lbs Pallof press 2 x 10 bil green band Seated D2 lift, 2 x 10 bil, 3 lbs weight   TREATMENT 06/12/22 Manual: Pt provides verbal consent for internal vaginal/rectal pelvic floor  exam. Internal vaginal pelvic floor exam Rt superficia/deep pelvic floor release to tolerance Gentle P/ROM of Rt hip with internal pelvic floor muscle release  Exercises: Propped lower trunk rotations 2 x 10 Piriformis stretch 60 sec bil    PATIENT EDUCATION:  Education details: information on dry needling, scar massage Person educated: Patient Education method: Explanation, Demonstration, Tactile cues, Verbal cues, and Handouts Education comprehension: verbalized understanding   HOME EXERCISE PROGRAM: [id number]   ASSESSMENT:   CLINICAL IMPRESSION: Pt very frustrated and tearful today as she feels like she is having worsening symptoms all over body. MD has said that [place] was normal with no other treatment suggestions for culmination of symptoms. Recommended that pt see mental health counselor and begin using [employer] in order to help with how she is feeling emotionally about condition and impact of chronic pain. Discussed that pelvic floor tension may be having large impact of many of her symptoms. We also practiced deep core stabilization exercises to perform during functional and work activities to help provide greater support and pain relief. Significant deep pelvic floor tension bil; excellent improvement in low back pain with release of these muscles and further reduction in low back pain (0/10) after dry needling and manual techniques to low back and bil hips. Extended treatment session today due to patient availability and cancellation after her appointment. She will continue to benefit from skilled PT intervention in order to decrease pain, address impairments, return to intercourse without pain, and improve bowel/bladder function in order to improve QOL.    OBJECTIVE IMPAIRMENTS: decreased activity tolerance, decreased coordination, decreased endurance, decreased strength, increased fascial restrictions, increased muscle spasms, impaired tone, postural dysfunction, and pain.    ACTIVITY  LIMITATIONS: standing, squatting, sleeping, continence, and locomotion level   PARTICIPATION LIMITATIONS: interpersonal relationship, community activity, occupation, and school   PERSONAL FACTORS: 1 comorbidity: c/section  are also affecting patient's functional outcome.    REHAB POTENTIAL: Good   CLINICAL DECISION MAKING: Stable/uncomplicated   EVALUATION COMPLEXITY: Low     GOALS: Goals reviewed with patient? Yes   SHORT TERM GOALS: Target date: 05/14/22 - updated 05/22/22 - updated 07/03/22   Pt will be independent with HEP.    Baseline: Goal status: MET 07/03/22   2.  Pt will be able to correctly perform diaphragmatic breathing and appropriate pressure management in order to prevent worsening vaginal wall laxity and improve pelvic floor A/ROM.    Baseline:  Goal status: MET 07/03/22   3.  Pt will be independent with diaphragmatic breathing and down training activities in order to improve pelvic floor relaxation.   Baseline:  Goal status: MET 07/03/22   4.  Pt will be independent with the knack, urge suppression technique, and double voiding in order to improve bladder habits and decrease urinary incontinence.    Baseline:  Goal status: MET 07/03/22   5.  Pt will be independent with use of squatty potty, relaxed toileting mechanics, and improved bowel movement techniques in order to increase ease of bowel movements and complete evacuation.    Baseline:  Goal status: MET 07/03/22       LONG TERM GOALS: Target date: 07/09/2022 - updated 05/22/22 - updated 07/03/22   Pt will be independent with advanced HEP.    Baseline:  Goal status: IN PROGRESS   2.  Pt will demonstrate normal pelvic floor muscle tone and A/ROM, able to achieve 4/5 strength with contractions and 10 sec endurance, in order to provide appropriate lumbopelvic support in functional activities.    Baseline:  Goal status: IN PROGRESS   3.  Pt will be able to go 2-3 hours in between voids without urgency or  incontinence in order to improve QOL and perform all functional activities with less difficulty.    Baseline:  Goal status: IN PROGRESS  4.  Pt will have regular bowel movement with normal consistent, no straining, and no pain.  Baseline:  Goal status: IN PROGRESS   5.  Pt will report 0/10 pain with vaginal penetration in order to improve intimate relationship with partner.     Baseline:  Goal status: IN PROGRESS   6.  Pt will demonstrate increase in all impaired hip strength by 1 muscle grades and bil negative stork test in order to demonstrate improved lumbopelvic support and increase functional ability.    Baseline:  Goal status: IN PROGRESS   PLAN:   PT FREQUENCY: 1-2x/week   PT DURATION: 12 weeks   PLANNED INTERVENTIONS: Therapeutic exercises, Therapeutic activity, Neuromuscular re-education, Balance training, Gait training, Patient/Family education, Self Care, Joint mobilization, Dry Needling, Biofeedback, and Manual therapy   PLAN FOR NEXT SESSION: possible internal vaginal pelvic floor release; progress functional strengthening.   Heather Roberts, PT, DPT02/14/2412:27 PM

## 2022-07-10 ENCOUNTER — Ambulatory Visit: Payer: Medicaid Other

## 2022-07-10 DIAGNOSIS — M5459 Other low back pain: Secondary | ICD-10-CM

## 2022-07-10 DIAGNOSIS — M62838 Other muscle spasm: Secondary | ICD-10-CM

## 2022-07-10 DIAGNOSIS — R102 Pelvic and perineal pain: Secondary | ICD-10-CM

## 2022-07-10 DIAGNOSIS — R293 Abnormal posture: Secondary | ICD-10-CM

## 2022-07-10 DIAGNOSIS — R279 Unspecified lack of coordination: Secondary | ICD-10-CM

## 2022-07-10 DIAGNOSIS — M6281 Muscle weakness (generalized): Secondary | ICD-10-CM

## 2022-07-10 IMAGING — US US MFM FETAL BPP W/O NON-STRESS
1 series · 12 of 27 positions shown · non-contrast
Comparison: none

[Series 1: us mfm fetal bpp w/o non-stress · 27 acquisitions, 12 frames shown]
[im 2/27]
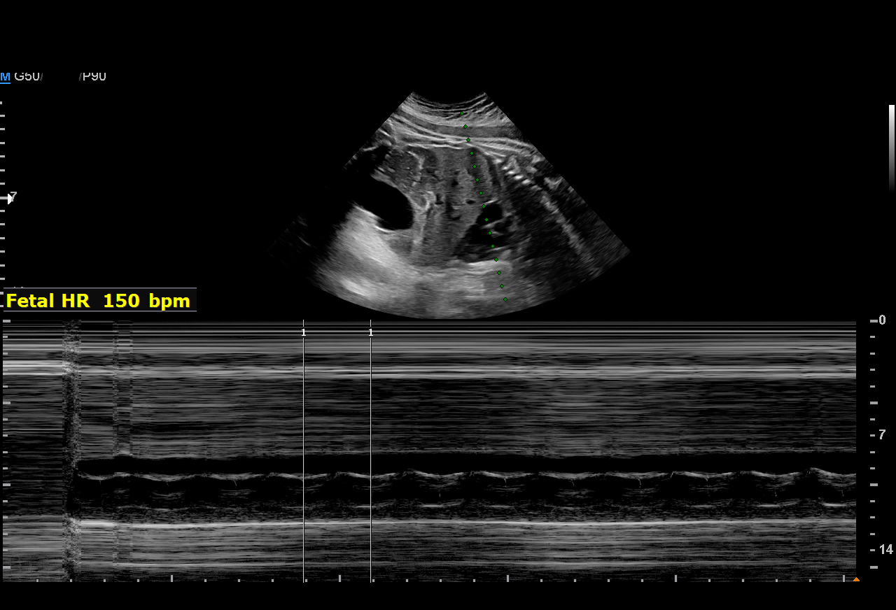
[im 4/27]
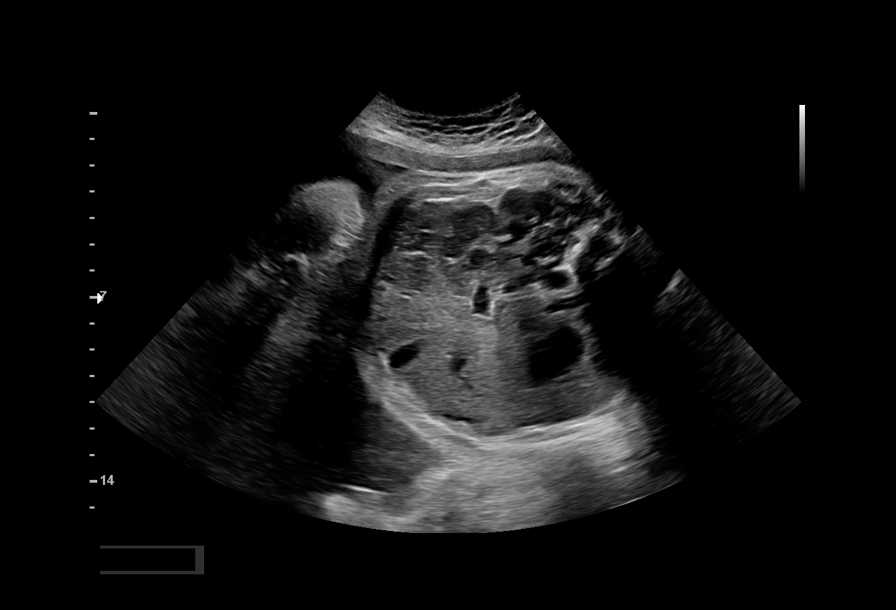
[im 6/27]
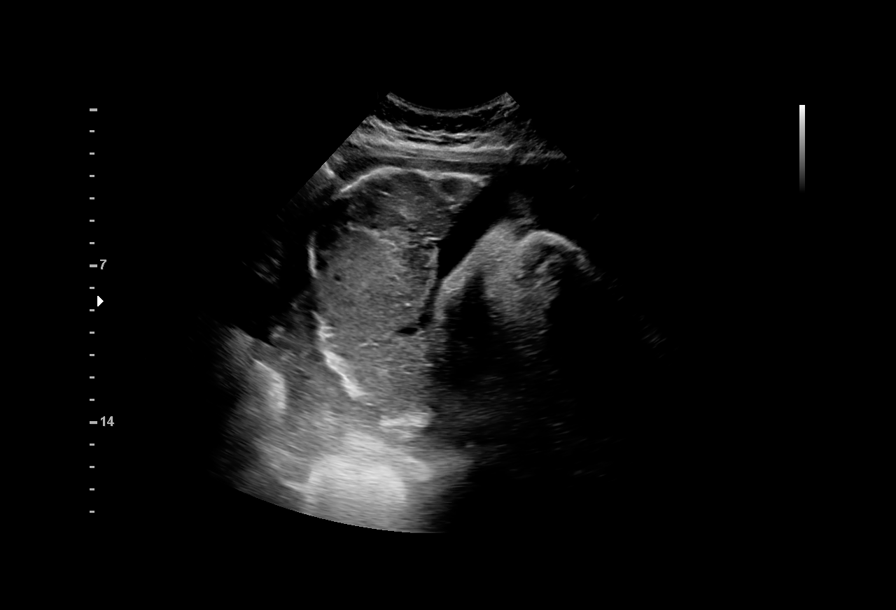
[im 8/27]
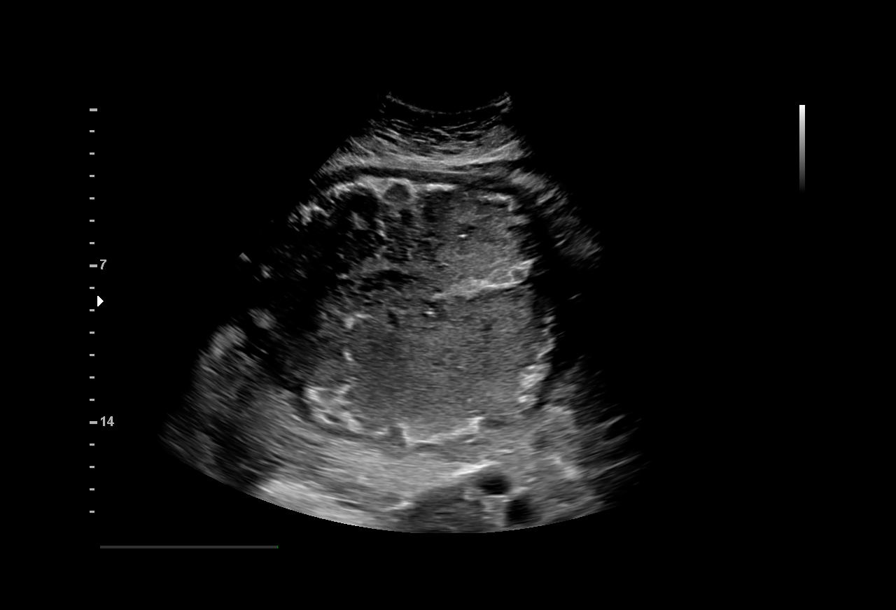
[im 11/27]
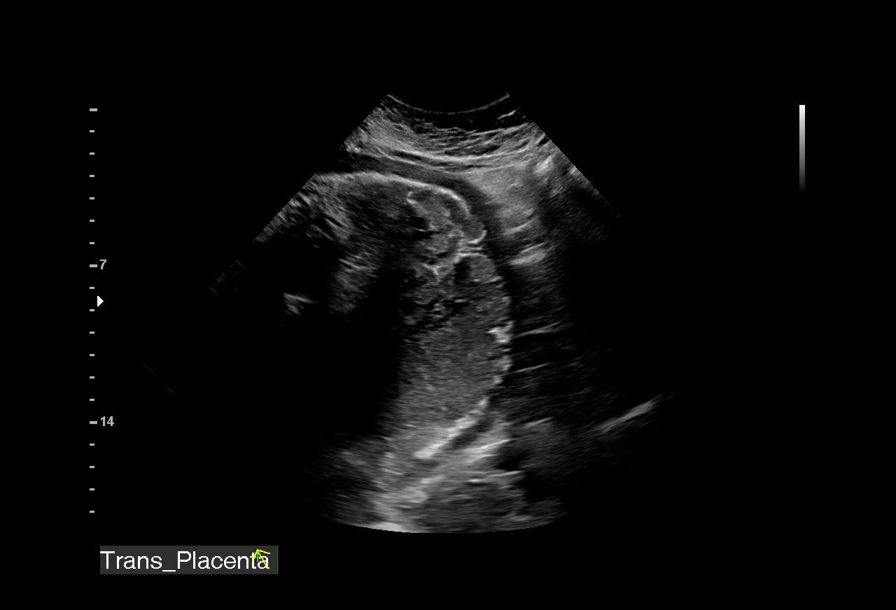
[im 13/27]
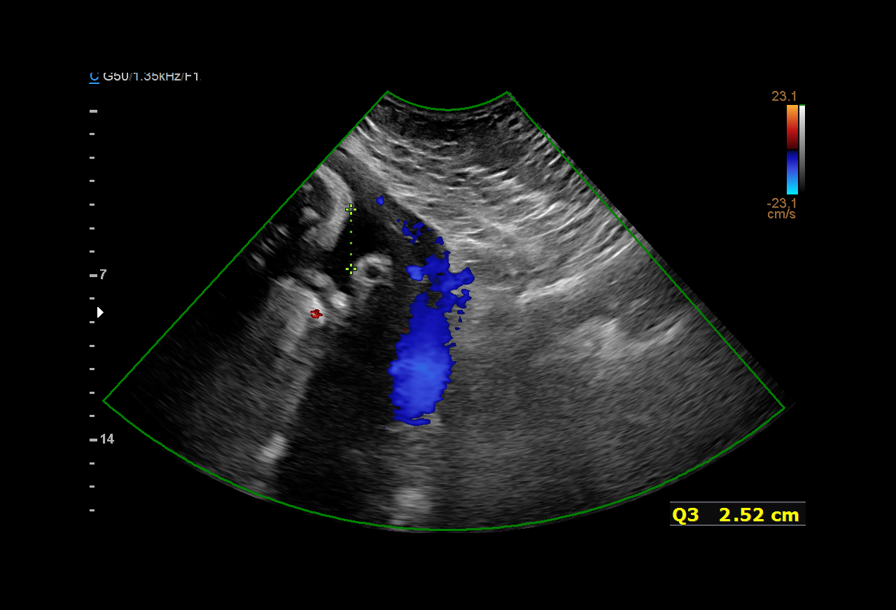
[im 15/27]
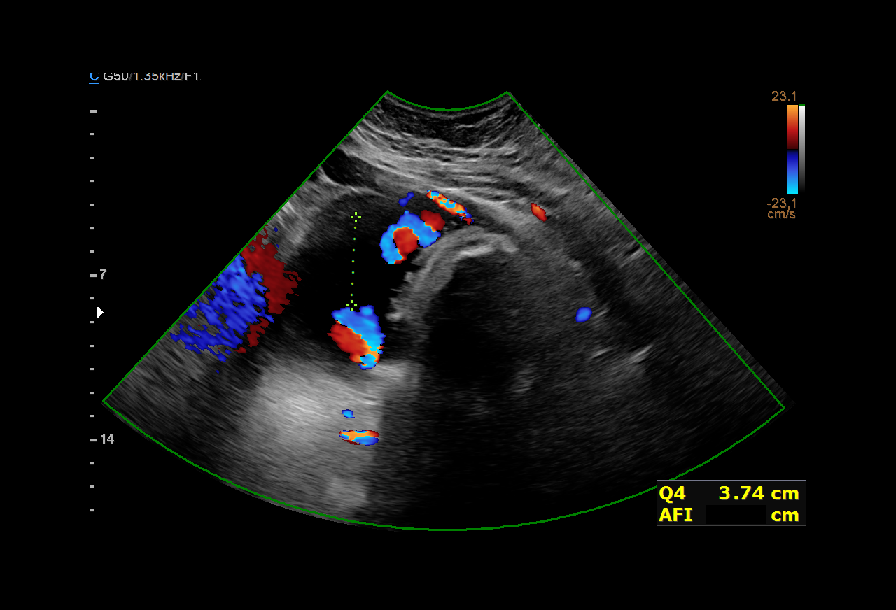
[im 17/27]
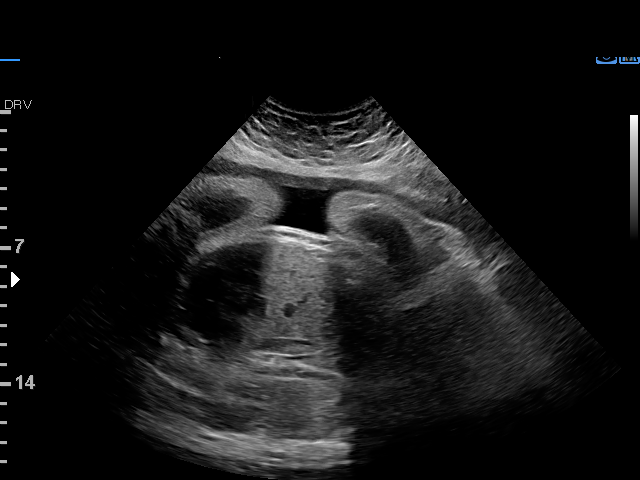
[im 20/27]
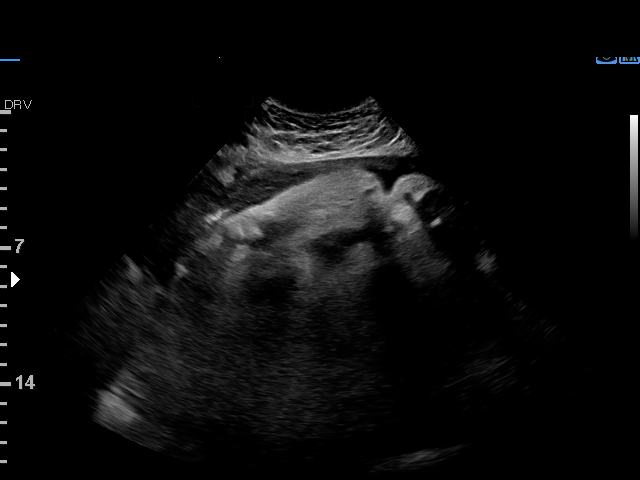
[im 22/27]
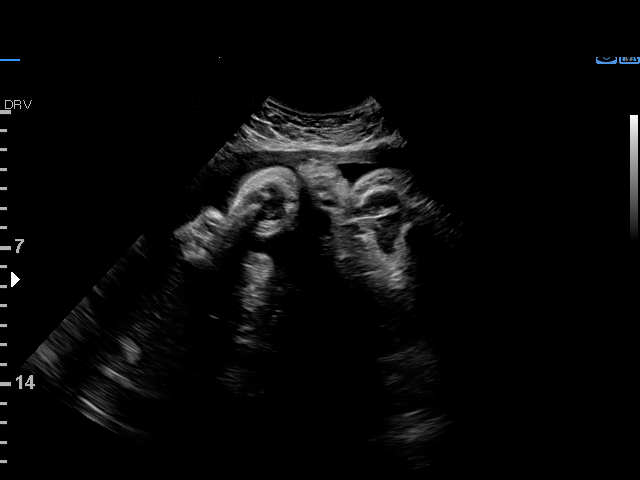
[im 24/27]
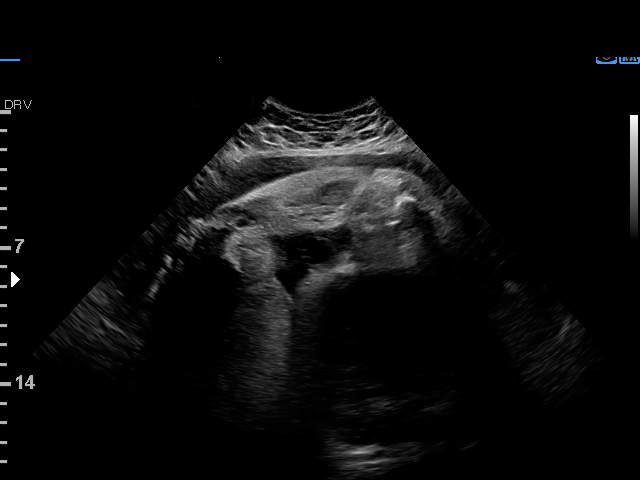
[im 26/27]
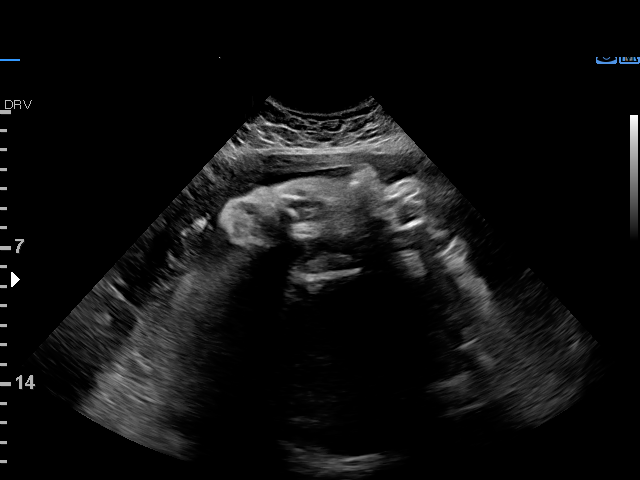

[12 of 27 positions shown; findings below may reference images not displayed]

[facility]

                                                            [REDACTED]. [HOSPITAL],
                   CHAI CNM

Indications

 Decreased fetal movement
 38 weeks gestation of pregnancy
 Obesity complicating pregnancy, third
 trimester (BMI 33)
 Low risk NIPS,
Fetal Evaluation

 Num Of Fetuses:         1
 Fetal Heart Rate(bpm):  150
 Cardiac Activity:       Observed
 Presentation:           Cephalic
 Placenta:               Posterior

 Amniotic Fluid
 AFI FV:      Within normal limits

 AFI Sum(cm)     %Tile       Largest Pocket(cm)
 12.2            45

 RUQ(cm)       RLQ(cm)       LUQ(cm)        LLQ(cm)

Biophysical Evaluation

 Amniotic F.V:   Within normal limits       F. Tone:        Observed
 F. Movement:    Observed                   Score:          [DATE]
 F. Breathing:   Observed
OB History
 Gravidity:    1         Term:   0        Prem:   0        SAB:   0
 TOP:          0       Ectopic:  0        Living: 0
Gestational Age

 LMP:           41w 0d        Date:  04/23/19                 EDD:   01/28/20
 Best:          38w 5d     Det. By:  Early Ultrasound         EDD:   02/13/20
                                     (06/19/19)
Cervix Uterus Adnexa

 Cervix
 Not visualized (advanced GA >55wks)
Comments

 This patient presented to the [facility] due to decreased fetal
 movements.
 A biophysical profile performed today was [DATE].
 There was normal amniotic fluid noted on today's ultrasound
 exam.

## 2022-07-10 NOTE — Therapy (Signed)
OUTPATIENT PHYSICAL THERAPY TREATMENT NOTE   Patient Name: Michele Carney MRN: [id number] DOB:January 25, 2001, 22 y.o., female Today's Date: 07/10/2022  PCP: Luetta Nutting, DO  REFERRING PROVIDER: Johnston Ebbs, NP   END OF SESSION:   PT End of Session - 07/10/22 0931     Visit Number 9    Date for PT Re-Evaluation 10/02/22    Authorization Type [employer]    Authorization Time Period 11/28-2/26    Authorization - Visit Number 8    Authorization - Number of Visits 13    PT Start Time 0930    PT Stop Time 1010    PT Time Calculation (min) 40 min    Activity Tolerance Patient tolerated treatment well    Behavior During Therapy WFL for tasks assessed/performed                   Past Medical History:  Diagnosis Date   Anxiety    Depression    Past Surgical History:  Procedure Laterality Date   CESAREAN SECTION N/A 02/08/2020   Procedure: CESAREAN SECTION;  Surgeon: Florian Buff, MD;  Location: [facility]S;  Service: Obstetrics;  Laterality: N/A;   Patient Active Problem List   Diagnosis Date Noted   Well adult exam 03/07/2022   Acute maxillary sinusitis 03/07/2022   Chronic bilateral low back pain without sciatica 11/30/2020   Iron deficiency anemia 02/10/2020   Cesarean delivery delivered 02/08/2020   Non-reactive NST (non-stress test) 02/06/2020   Significant discrepancy between uterine size and clinical dates, antepartum, second trimester 11/16/2019   Yeast vaginitis 10/14/2019   GBS (group B streptococcus) UTI complicating pregnancy 99991111   Supervision of normal first pregnancy, antepartum 07/05/2019   MDD (major depressive disorder) 05/25/2019   Chronic migraine without aura without status migrainosus, not intractable 12/30/2017   REFERRING DIAG: M62.89 (ICD-10-CM) - Pelvic floor dysfunction in female N94.10 (ICD-10-CM) - Dyspareunia in female R35.0 (ICD-10-CM) - Urinary frequency   THERAPY DIAG:  Abnormal posture   Muscle  weakness (generalized)   Other muscle spasm   Unspecified lack of coordination   Other low back pain   Pelvic pain   Rationale for Evaluation and Treatment: Rehabilitation   ONSET DATE: 02/08/20   SUBJECTIVE: Pt states that she did notice an improvement in low back pain after last session. However, she did not work or have clinicals over the weekend. She has been doing daily body scans and finds them helpful. She has purchased pelvic floor muscle wand but has not started using - unsure of which lubricant to use. She states that urinary symptoms are unchanged currently - still having a lot of urinary urgency. Pt currently in 4/10 pain.  04/16/22 SUBJECTIVE STATEMENT: Pt states that low back pain started 2 years ago after having daughter and it seems to get worse around her cycle. She has had pain with intercourse that started around this time as well - been consistently bad since she had a yeast infection this past May. She will feel lower abdominal pain during intercourse. She will have sciatica on Lt side.      PAIN:  Are you having pain? Yes NPRS scale: 4/10 low back;  Pain location:  low back pain   Pain type: dull Pain description: aching    Aggravating factors: any activity, menstrual cycle, flexion Relieving factors: epsom salt baths, icy hot   PRECAUTIONS: None   WEIGHT BEARING RESTRICTIONS: No   FALLS:  Has patient fallen in last 6 months? 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   PLOF: Independent   PATIENT GOALS: decrease back pain and pain free intercourse; reduce tightness   PERTINENT HISTORY:  C/section Sexual abuse: No   BOWEL  MOVEMENT: Pain with bowel movement: Yes - pain around the anus and in abdomen, 6/10 Type of bowel movement:Type (Bristol Stool Scale) 3 - varies in consistency, Frequency at least 1x/day, and Strain Yes - she's trying to strain less, uses squatty potty Fully empty rectum: No Leakage: No Pads: No Fiber supplement: Noworking on eating more fiber   URINATION: Pain with urination: Yes - sometimes; gyn has ruled out infection multiple times , 6/10 Fully empty bladder: No Stream: Strong Urgency: Yes: immediately or will leak Frequency: 2-3x/hour Leakage: Urge to void, Walking to the bathroom, Laughing, and at night she will wake up wet (6-8x in the last month) Pads: No   INTERCOURSE: Pain with intercourse: Initial Penetration, During Penetration, and After Intercourse Ability to have vaginal penetration:  No Climax: normal Marinoff Scale: 0/3   PREGNANCY: Vaginal deliveries 0 Tearing No C-section deliveries 1 - she does have incisional pain and feels trauma surrounding delivery  Currently pregnant No   PROLAPSE: Feels like organs are trying to come out since delivery   OBJECTIVE:  07/10/22: Pelvic assessment: 2/5 strength still having difficulty with coordination 10 second hold with verbal cues for re-squeeze Pain and restriction at perineal body and throughout 1st layer Redness and labia minora fusion (Rt>Lt) Deep pelvic floor muscle tenderness/spasm bil PFIQ-7: 71  06/05/22:             External Perineal Exam pain along superficial transverse perineal/ischiocavernosus and inferior pubic ramus                             Internal Pelvic Floor Tenderness at posterior fourchette; pain throughout Rt superficial/deep pelvic floor (Lt with mild increase in tone, but no pain   Patient confirms identification and approves PT to assess internal pelvic floor and treatment Yes    PELVIC MMT:   MMT eval  Vaginal  1/5, poor relaxation  Internal Anal Sphincter    External Anal  Sphincter    Puborectalis    Diastasis Recti    (Blank rows = not tested)         TONE: High     PATIENT SURVEYS:    PFIQ-7 81   COGNITION: Overall cognitive status: Within functional limits for tasks assessed  SENSATION: Light touch: Appears intact Proprioception: Appears intact   MUSCLE LENGTH: Hip flexor: inc low back pain on Lt; WNL Piriformis: restricted bil, pressure in lower abdomen IR: painful on Lt in groin/lower abdomen; WNL bil   FUNCTIONAL TESTS:              Squat: Large Rt weight shift (hurt Lt foot at work the other day)             Single leg stance: 10 sec bil, less stable on Lt             Stork: Lt stance with Rt pelvic drop              GAIT: Comments: WNL   POSTURE: forward head, increased lumbar lordosis, decreased thoracic kyphosis, anterior pelvic tilt, and Rt thoracic rotation (possible very mild Rt thoracic curvature)   LUMBARAROM/PROM:   A/PROM A/PROM  Eval (% available)  Flexion 100  Extension 50  Right lateral flexion 50  Left lateral flexion 50  Right rotation 50  Left rotation 50   (Blank rows = not tested)     LOWER EXTREMITY MMT:   MMT Right eval Left eval  Hip flexion 4 4  Hip extension 3 3  Hip abduction 4- 4-  Hip adduction 4- 4-  Hip internal rotation 4- 4-  Hip external rotation 4- 4-  Knee flexion      Knee extension      Ankle dorsiflexion      Ankle plantarflexion      Ankle inversion      Ankle eversion      *all reproduced low back pain; abd/adduction, ext, and bil hip rotation testing reproduced bil groin/pelvic pain PALPATION:   General  -1 finger width separation at umbilicus with distortion             -tenderness directly over Rt SIJ with surrounding muscle tension             -bil lumbar paraspinal tightness and tenderness from T12-L5        TODAY'S TREATMENT 07/10/22 RE-EVAL Manual: Pt provides verbal consent for internal vaginal/rectal pelvic floor exam. Bil deep  pelvic floor muscle release Therapeutic activities: Gentle cleansing after bowel movements - peri bottle/wet wipes Urge drill   TREATMENT 07/03/22 Manual: Pt provides verbal consent for internal vaginal/rectal pelvic floor exam. Bil internal vaginal deep pelvic floor release Trigger Point Dry-Needling  Treatment instructions: Expect mild to moderate muscle soreness. S/S of pneumothorax if dry needled over a lung field, and to seek immediate medical attention should they occur. Patient verbalized understanding of these instructions and education.  Patient Consent Given: Yes Education handout provided: Previously provided Muscles treated: Bil lumbar paraspinals and Rt glutes Electrical stimulation performed: No Parameters: N/A Treatment response/outcome: release of trigger points Myofascial release lumbar paraspinals Neuromuscular re-education: Transversus abdominus training with multimodal cues for improved motor control and breath coordination Pelvic tilts 2 x 10 Therapeutic activities: Mental health counselor Plan of care HEP review - specific focus on deep core stabilization - ways to incorporate deep stabilizing exercises into dialy life and work in seated and standing positions *Extra time spent with patient this session due to cancellation afterwards and patient having time to stay for longer treatment  TREATMENT 06/19/22 Manual: Trigger Point Dry-Needling  Treatment instructions: Expect mild to moderate muscle soreness. S/S of pneumothorax if dry needled over a lung field, and to seek immediate medical attention should they occur. Patient verbalized understanding of these  instructions and education.  Patient Consent Given: Yes Education handout provided: Previously provided Muscles treated: Bil lumbar paraspinals and Rt glutes Electrical stimulation performed: No Parameters: N/A Treatment response/outcome: release of trigger points Negative pressure soft  tissue  mobilization Myofascial release Exercises: Hip 3-way 10x ea, bil Lower trunk rotation in propped seated position 10x Therapeutic activities: HEP consolidated/review Squats with anteiror weight hold to table, 2 x 10, 3 lbs Pallof press 2 x 10 bil green band Seated D2 lift, 2 x 10 bil, 3 lbs weight      PATIENT EDUCATION:  Education details: information on dry needling, scar massage Person educated: Patient Education method: Explanation, Demonstration, Tactile cues, Verbal cues, and Handouts Education comprehension: verbalized understanding   HOME EXERCISE PROGRAM: [id number]   ASSESSMENT:   CLINICAL IMPRESSION: Re-evaluation today; pt overall has not seen as much progressed as we would have liked at this point, but stress, work/school, and lack of compliance with HEP. After large increase in pain and stress over condition at time of last visit, we discussed improved HEP compliance. Pt has worked hard over the last week and already seen improvement. We discussed the urge drill today to help with urinary urgency and frequency. Due to labial irritation, discussed reducing exposure of irritants to these tissues and gentle cleansing. She has not made progress with pelvic floor strength and coordination, but with cues during today's session she did better. She continues to have muscle spasm in superficial and deep pelvic floor that she can feels referred into her hips and low back. Good tolerance to release today although she did report mild increase in low back pain at end of session. She will continue to benefit from skilled PT intervention in order to decrease pain, address impairments, return to intercourse without pain, and improve bowel/bladder function in order to improve QOL.    OBJECTIVE IMPAIRMENTS: decreased activity tolerance, decreased coordination, decreased endurance, decreased strength, increased fascial restrictions, increased muscle spasms, impaired tone, postural dysfunction, and  pain.    ACTIVITY LIMITATIONS: standing, squatting, sleeping, continence, and locomotion level   PARTICIPATION LIMITATIONS: interpersonal relationship, community activity, occupation, and school   PERSONAL FACTORS: 1 comorbidity: c/section  are also affecting patient's functional outcome.    REHAB POTENTIAL: Good   CLINICAL DECISION MAKING: Stable/uncomplicated   EVALUATION COMPLEXITY: Low     GOALS: Goals reviewed with patient? Yes   SHORT TERM GOALS: Target date: 05/14/22 - updated 05/22/22 - updated 07/03/22   Pt will be independent with HEP.    Baseline: Goal status: MET 07/03/22   2.  Pt will be able to correctly perform diaphragmatic breathing and appropriate pressure management in order to prevent worsening vaginal wall laxity and improve pelvic floor A/ROM.    Baseline:  Goal status: MET 07/03/22   3.  Pt will be independent with diaphragmatic breathing and down training activities in order to improve pelvic floor relaxation.   Baseline:  Goal status: MET 07/03/22   4.  Pt will be independent with the knack, urge suppression technique, and double voiding in order to improve bladder habits and decrease urinary incontinence.    Baseline: reviewed 07/10/22 Goal status: IN PROGRESS    5.  Pt will be independent with use of squatty potty, relaxed toileting mechanics, and improved bowel movement techniques in order to increase ease of bowel movements and complete evacuation.    Baseline:  Goal status: MET 07/03/22       LONG TERM GOALS: Target date: 07/09/2022 - updated 05/22/22 - updated  07/03/22 new goal 10/02/2022    Pt will be independent with advanced HEP.    Baseline:  Goal status: IN PROGRESS   2.  Pt will demonstrate normal pelvic floor muscle tone and A/ROM, able to achieve 4/5 strength with contractions and 10 sec endurance, in order to provide appropriate lumbopelvic support in functional activities.    Baseline:  Goal status: IN PROGRESS   3.  Pt will be  able to go 2-3 hours in between voids without urgency or incontinence in order to improve QOL and perform all functional activities with less difficulty.    Baseline:  Goal status: IN PROGRESS  4.  Pt will have regular bowel movement with normal consistent, no straining, and no pain.  Baseline:  Goal status: IN PROGRESS   5.  Pt will report 0/10 pain with vaginal penetration in order to improve intimate relationship with partner.     Baseline:  Goal status: IN PROGRESS   6.  Pt will demonstrate increase in all impaired hip strength by 1 muscle grades and bil negative stork test in order to demonstrate improved lumbopelvic support and increase functional ability.    Baseline:  Goal status: IN PROGRESS   PLAN:   PT FREQUENCY: 1-2x/week   PT DURATION: 12 weeks   PLANNED INTERVENTIONS: Therapeutic exercises, Therapeutic activity, Neuromuscular re-education, Balance training, Gait training, Patient/Family education, Self Care, Joint mobilization, Dry Needling, Biofeedback, and Manual therapy   PLAN FOR NEXT SESSION: possible internal vaginal pelvic floor release; progress functional strengthening.   Heather Roberts, PT, DPT02/21/2410:40 AM

## 2022-07-31 ENCOUNTER — Ambulatory Visit: Payer: Medicaid Other

## 2022-07-31 ENCOUNTER — Ambulatory Visit: Payer: Medicaid Other | Attending: Student

## 2022-07-31 DIAGNOSIS — M62838 Other muscle spasm: Secondary | ICD-10-CM | POA: Diagnosis present

## 2022-07-31 DIAGNOSIS — R102 Pelvic and perineal pain unspecified side: Secondary | ICD-10-CM

## 2022-07-31 DIAGNOSIS — M5459 Other low back pain: Secondary | ICD-10-CM

## 2022-07-31 DIAGNOSIS — M6281 Muscle weakness (generalized): Secondary | ICD-10-CM

## 2022-07-31 DIAGNOSIS — R293 Abnormal posture: Secondary | ICD-10-CM

## 2022-07-31 DIAGNOSIS — R279 Unspecified lack of coordination: Secondary | ICD-10-CM | POA: Diagnosis present

## 2022-07-31 NOTE — Therapy (Signed)
OUTPATIENT PHYSICAL THERAPY TREATMENT NOTE   Patient Name: Michele Carney MRN: [id number] DOB:Oct 25, 2000, 22 y.o., female Today's Date: 07/31/2022  PCP: Luetta Nutting, DO  REFERRING PROVIDER: Johnston Ebbs, NP   END OF SESSION:   PT End of Session - 07/31/22 1416     Visit Number 10    Date for PT Re-Evaluation 10/02/22    Authorization Type [employer]    Authorization Time Period 07/16/2022-09/13/2022    Authorization - Visit Number 1    Authorization - Number of Visits 5    PT Start Time [id number]    PT Stop Time 1443    PT Time Calculation (min) 31 min    Activity Tolerance Patient tolerated treatment well    Behavior During Therapy WFL for tasks assessed/performed                    Past Medical History:  Diagnosis Date   Anxiety    Depression    Past Surgical History:  Procedure Laterality Date   CESAREAN SECTION N/A 02/08/2020   Procedure: CESAREAN SECTION;  Surgeon: Florian Buff, MD;  Location: [facility];  Service: Obstetrics;  Laterality: N/A;   Patient Active Problem List   Diagnosis Date Noted   Well adult exam 03/07/2022   Acute maxillary sinusitis 03/07/2022   Chronic bilateral low back pain without sciatica 11/30/2020   Iron deficiency anemia 02/10/2020   Cesarean delivery delivered 02/08/2020   Non-reactive NST (non-stress test) 02/06/2020   Significant discrepancy between uterine size and clinical dates, antepartum, second trimester 11/16/2019   Yeast vaginitis 10/14/2019   GBS (group B streptococcus) UTI complicating pregnancy [id number]   Supervision of normal first pregnancy, antepartum 07/05/2019   MDD (major depressive disorder) 05/25/2019   Chronic migraine without aura without status migrainosus, not intractable 12/30/2017   REFERRING DIAG: M62.89 (ICD-10-CM) - Pelvic floor dysfunction in female N94.10 (ICD-10-CM) - Dyspareunia in female R35.0 (ICD-10-CM) - Urinary frequency   THERAPY DIAG:  Abnormal posture    Muscle weakness (generalized)   Other muscle spasm   Unspecified lack of coordination   Other low back pain   Pelvic pain   Rationale for Evaluation and Treatment: Rehabilitation   ONSET DATE: 02/08/20   SUBJECTIVE: Pt states that low back has felt much better over the last couple of weeks. She feels like she struggles with use of pelvic floor muscle wand. She has been working on yoga stretches more.                                                                                                                                                                                  04/16/22  SUBJECTIVE STATEMENT: Pt states that low back pain started 2 years ago after having daughter and it seems to get worse around her cycle. She has had pain with intercourse that started around this time as well - been consistently bad since she had a yeast infection this past May. She will feel lower abdominal pain during intercourse. She will have sciatica on Lt side.      PAIN:  Are you having pain? Yes NPRS scale: 4/10 low back;  Pain location:  low back pain   Pain type: dull Pain description: aching    Aggravating factors: any activity, menstrual cycle, flexion Relieving factors: epsom salt baths, icy hot   PRECAUTIONS: None   WEIGHT BEARING RESTRICTIONS: No   FALLS:  Has patient fallen in last 6 months? 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   PLOF: Independent   PATIENT GOALS: decrease back pain and pain free intercourse; reduce tightness   PERTINENT HISTORY:  C/section Sexual abuse: No   BOWEL MOVEMENT: Pain with bowel movement: Yes - pain around the anus and in abdomen, 6/10 Type of bowel movement:Type (Bristol Stool Scale) 3 - varies in consistency, Frequency at least 1x/day, and Strain Yes - she's trying to strain less, uses squatty  potty Fully empty rectum: No Leakage: No Pads: No Fiber supplement: Noworking on eating more fiber   URINATION: Pain with urination: Yes - sometimes; gyn has ruled out infection multiple times , 6/10 Fully empty bladder: No Stream: Strong Urgency: Yes: immediately or will leak Frequency: 2-3x/hour Leakage: Urge to void, Walking to the bathroom, Laughing, and at night she will wake up wet (6-8x in the last month) Pads: No   INTERCOURSE: Pain with intercourse: Initial Penetration, During Penetration, and After Intercourse Ability to have vaginal penetration:  No Climax: normal Marinoff Scale: 0/3   PREGNANCY: Vaginal deliveries 0 Tearing No C-section deliveries 1 - she does have incisional pain and feels trauma surrounding delivery  Currently pregnant No   PROLAPSE: Feels like organs are trying to come out since delivery   OBJECTIVE:  07/10/22: Pelvic assessment: 2/5 strength still having difficulty with coordination 10 second hold with verbal cues for re-squeeze Pain and restriction at perineal body and throughout 1st layer Redness and labia minora fusion (Rt>Lt) Deep pelvic floor muscle tenderness/spasm bil PFIQ-7: 71  06/05/22:             External Perineal Exam pain along superficial transverse perineal/ischiocavernosus and inferior pubic ramus                             Internal Pelvic Floor Tenderness at posterior fourchette; pain throughout Rt superficial/deep pelvic floor (Lt with mild increase in tone, but no pain   Patient confirms identification and approves PT to assess internal pelvic floor and treatment Yes    PELVIC MMT:   MMT eval  Vaginal  1/5, poor relaxation  Internal Anal Sphincter    External Anal Sphincter    Puborectalis    Diastasis Recti    (Blank rows = not tested)         TONE: High     PATIENT SURVEYS:    PFIQ-7 81   COGNITION: Overall cognitive status: Within functional limits for tasks assessed  SENSATION: Light touch: Appears intact Proprioception: Appears intact   MUSCLE LENGTH: Hip flexor: inc low back pain on Lt; WNL Piriformis: restricted bil, pressure in lower abdomen IR: painful on Lt in groin/lower abdomen; WNL bil   FUNCTIONAL TESTS:              Squat: Large Rt weight shift (hurt Lt foot at work the other day)             Single leg stance: 10 sec bil, less stable on Lt             Stork: Lt stance with Rt pelvic drop              GAIT: Comments: WNL   POSTURE: forward head, increased lumbar lordosis, decreased thoracic kyphosis, anterior pelvic tilt, and Rt thoracic rotation (possible very mild Rt thoracic curvature)   LUMBARAROM/PROM:   A/PROM A/PROM  Eval (% available)  Flexion 100  Extension 50  Right lateral flexion 50  Left lateral flexion 50  Right rotation 50  Left rotation 50   (Blank rows = not tested)     LOWER EXTREMITY MMT:   MMT Right eval Left eval  Hip flexion 4 4  Hip extension 3 3  Hip abduction 4- 4-  Hip adduction 4- 4-  Hip internal rotation 4- 4-  Hip external rotation 4- 4-  Knee flexion      Knee extension      Ankle dorsiflexion      Ankle plantarflexion      Ankle inversion      Ankle eversion      *all reproduced low back pain; abd/adduction, ext, and bil hip rotation testing reproduced bil groin/pelvic pain PALPATION:   General  -1 finger width separation at umbilicus with distortion             -tenderness directly over Rt SIJ with surrounding muscle tension             -bil lumbar paraspinal tightness and tenderness from T12-L5        TODAY'S TREATMENT 07/31/22 Neuromuscular re-education: Pelvic tilts 2 x 10 Supine UE ball press 2 x 10 Sidelying UE ball press 2 x 10 Exercises: Supine piriformis stretch 60 sec bil Supine hamstring 3 way stretch 60 sec bil Wide foot lower trunk rotation in propped sitting 2 x 10   TREATMENT 07/10/22 RE-EVAL Manual: Pt provides verbal consent for internal  vaginal/rectal pelvic floor exam. Bil deep pelvic floor muscle release Therapeutic activities: Gentle cleansing after bowel movements - peri bottle/wet wipes Urge drill   TREATMENT 07/03/22 Manual: Pt provides verbal consent for internal vaginal/rectal pelvic floor exam. Bil internal vaginal deep pelvic floor release Trigger Point Dry-Needling  Treatment instructions: Expect mild to moderate muscle soreness. S/S of pneumothorax if dry needled over a lung field, and to seek immediate medical attention should they occur. Patient verbalized understanding of these instructions and education.  Patient Consent Given: Yes Education handout provided: Previously provided Muscles treated: Bil lumbar paraspinals and Rt glutes Electrical stimulation performed: No Parameters: N/A Treatment response/outcome: release of trigger points Myofascial release lumbar paraspinals Neuromuscular re-education: Transversus abdominus training with multimodal cues for improved motor control and breath coordination Pelvic tilts 2 x 10 Therapeutic activities: Mental health counselor Plan of care HEP review - specific focus on deep core stabilization - ways to incorporate deep stabilizing exercises into dialy life and work in seated and standing positions *Extra time spent with patient this session due to  cancellation afterwards and patient having time to stay for longer treatment      PATIENT EDUCATION:  Education details: information on dry needling, scar massage Person educated: Patient Education method: Explanation, Demonstration, Tactile cues, Verbal cues, and Handouts Education comprehension: verbalized understanding   HOME EXERCISE PROGRAM: [id number]   ASSESSMENT:   CLINICAL IMPRESSION: Pt doing much better over the last two weeks with improved low back pain. Due to this, focus of today's treatment session spent on stretching/mobility and core training. She did very well with all exercises and  activities. Internal rotation stretch felt deeply into pelvic floor and hip. She will continue to benefit from skilled PT intervention in order to decrease pain, address impairments, return to intercourse without pain, and improve bowel/bladder function in order to improve QOL.    OBJECTIVE IMPAIRMENTS: decreased activity tolerance, decreased coordination, decreased endurance, decreased strength, increased fascial restrictions, increased muscle spasms, impaired tone, postural dysfunction, and pain.    ACTIVITY LIMITATIONS: standing, squatting, sleeping, continence, and locomotion level   PARTICIPATION LIMITATIONS: interpersonal relationship, community activity, occupation, and school   PERSONAL FACTORS: 1 comorbidity: c/section  are also affecting patient's functional outcome.    REHAB POTENTIAL: Good   CLINICAL DECISION MAKING: Stable/uncomplicated   EVALUATION COMPLEXITY: Low     GOALS: Goals reviewed with patient? Yes   SHORT TERM GOALS: Target date: 05/14/22 - updated 05/22/22 - updated 07/03/22   Pt will be independent with HEP.    Baseline: Goal status: MET 07/03/22   2.  Pt will be able to correctly perform diaphragmatic breathing and appropriate pressure management in order to prevent worsening vaginal wall laxity and improve pelvic floor A/ROM.    Baseline:  Goal status: MET 07/03/22   3.  Pt will be independent with diaphragmatic breathing and down training activities in order to improve pelvic floor relaxation.   Baseline:  Goal status: MET 07/03/22   4.  Pt will be independent with the knack, urge suppression technique, and double voiding in order to improve bladder habits and decrease urinary incontinence.    Baseline: reviewed 07/10/22 Goal status: IN PROGRESS    5.  Pt will be independent with use of squatty potty, relaxed toileting mechanics, and improved bowel movement techniques in order to increase ease of bowel movements and complete evacuation.    Baseline:   Goal status: MET 07/03/22       LONG TERM GOALS: Target date: 07/09/2022 - updated 05/22/22 - updated 07/03/22 new goal 10/02/2022    Pt will be independent with advanced HEP.    Baseline:  Goal status: IN PROGRESS   2.  Pt will demonstrate normal pelvic floor muscle tone and A/ROM, able to achieve 4/5 strength with contractions and 10 sec endurance, in order to provide appropriate lumbopelvic support in functional activities.    Baseline:  Goal status: IN PROGRESS   3.  Pt will be able to go 2-3 hours in between voids without urgency or incontinence in order to improve QOL and perform all functional activities with less difficulty.    Baseline:  Goal status: IN PROGRESS  4.  Pt will have regular bowel movement with normal consistent, no straining, and no pain.  Baseline:  Goal status: IN PROGRESS   5.  Pt will report 0/10 pain with vaginal penetration in order to improve intimate relationship with partner.     Baseline:  Goal status: IN PROGRESS   6.  Pt will demonstrate increase in all impaired hip strength by 1 muscle grades  and bil negative stork test in order to demonstrate improved lumbopelvic support and increase functional ability.    Baseline:  Goal status: IN PROGRESS   PLAN:   PT FREQUENCY: 1-2x/week   PT DURATION: 12 weeks   PLANNED INTERVENTIONS: Therapeutic exercises, Therapeutic activity, Neuromuscular re-education, Balance training, Gait training, Patient/Family education, Self Care, Joint mobilization, Dry Needling, Biofeedback, and Manual therapy   PLAN FOR NEXT SESSION: possible internal vaginal pelvic floor release; progress functional strengthening.   Heather Roberts, PT, DPT03/13/243:17 PM

## 2022-08-07 ENCOUNTER — Ambulatory Visit: Payer: Medicaid Other

## 2022-08-12 ENCOUNTER — Ambulatory Visit
Admission: EM | Admit: 2022-08-12 | Discharge: 2022-08-12 | Disposition: A | Discharge status: Home / Self Care | Payer: Medicaid Other | Attending: Urgent Care | Admitting: Urgent Care

## 2022-08-12 ENCOUNTER — Ambulatory Visit (INDEPENDENT_AMBULATORY_CARE_PROVIDER_SITE_OTHER): Payer: Medicaid Other

## 2022-08-12 DIAGNOSIS — R0789 Other chest pain: Secondary | ICD-10-CM | POA: Insufficient documentation

## 2022-08-12 DIAGNOSIS — R058 Other specified cough: Secondary | ICD-10-CM | POA: Diagnosis not present

## 2022-08-12 DIAGNOSIS — R000 Tachycardia, unspecified: Secondary | ICD-10-CM | POA: Diagnosis not present

## 2022-08-12 DIAGNOSIS — Z1152 Encounter for screening for COVID-19: Secondary | ICD-10-CM | POA: Diagnosis not present

## 2022-08-12 DIAGNOSIS — R059 Cough, unspecified: Secondary | ICD-10-CM | POA: Diagnosis not present

## 2022-08-12 DIAGNOSIS — R112 Nausea with vomiting, unspecified: Secondary | ICD-10-CM | POA: Diagnosis not present

## 2022-08-12 DIAGNOSIS — R079 Chest pain, unspecified: Secondary | ICD-10-CM

## 2022-08-12 DIAGNOSIS — R0602 Shortness of breath: Secondary | ICD-10-CM | POA: Diagnosis not present

## 2022-08-12 DIAGNOSIS — B349 Viral infection, unspecified: Secondary | ICD-10-CM | POA: Diagnosis not present

## 2022-08-12 MED ORDER — ONDANSETRON 4 MG PO TBDP: 2.0000 mg | ORAL_TABLET | Freq: Once | ORAL | Status: DC

## 2022-08-12 MED ORDER — ONDANSETRON 8 MG PO TBDP: 8.0000 mg | ORAL_TABLET | Freq: Three times a day (TID) | ORAL | 0 refills | Status: DC | PRN

## 2022-08-12 MED ORDER — CETIRIZINE HCL 10 MG PO TABS: 10.0000 mg | ORAL_TABLET | Freq: Every day | ORAL | 0 refills | Status: DC

## 2022-08-12 MED ORDER — IPRATROPIUM BROMIDE 0.03 % NA SOLN: 2.0000 | Freq: Two times a day (BID) | NASAL | 0 refills | Status: DC

## 2022-08-12 MED ORDER — PROMETHAZINE-DM 6.25-15 MG/5ML PO SYRP: 5.0000 mL | ORAL_SOLUTION | Freq: Three times a day (TID) | ORAL | 0 refills | Status: DC | PRN

## 2022-08-12 MED ORDER — ONDANSETRON 4 MG PO TBDP
4.0000 mg | ORAL_TABLET | Freq: Once | ORAL | Status: AC
  Administered 2022-08-12: 4 mg via ORAL

## 2022-08-12 NOTE — ED Triage Notes (Signed)
Cough - Chest pain , trouble swallowing, chills, green yellow mucus with blood, headache, and  nausea x 2 days    ,C/o chest pain at a 5 pressure SOB,

## 2022-08-12 NOTE — ED Provider Notes (Signed)
[facility]  Note:  This document was prepared using Systems analyst and may include unintentional dictation errors.  MRN: [id number] DOB: Mar 31, 2001  Subjective:   Michele Carney is a 22 y.o. female presenting for 2-day history of acute onset persistent severe malaise, fatigue, chest pain, coughing, shortness of breath and chest pressure, painful swallowing, chills, sinus headaches.  Has also had nausea with gagging/mild vomiting.  No history of asthma. No smoking of any kind including cigarettes, cigars, vaping, marijuana use.  Patient would like to be checked for COVID.  Last month she did undergo a course of Augmentin for sinus infection.   Current Facility-Administered Medications:    ondansetron (ZOFRAN-ODT) disintegrating tablet 4 mg, 4 mg, Oral, Once, Jaynee Eagles, PA-C  Current Outpatient Medications:    amoxicillin-clavulanate (AUGMENTIN) 875-125 MG tablet, Take 1 tablet by mouth every 12 (twelve) hours., Disp: 14 tablet, Rfl: 0   fluconazole (DIFLUCAN) 150 MG tablet, Take 150 mg by mouth daily., Disp: , Rfl:    ibuprofen (ADVIL) 600 MG tablet, Take 1 tablet (600 mg total) by mouth 3 (three) times daily. (Patient not taking: Reported on 03/07/2022), Disp: 30 tablet, Rfl: 0   lidocaine (XYLOCAINE) 2 % jelly, Apply 1 application topically 3 (three) times daily. (Patient not taking: Reported on 01/09/2022), Disp: 30 mL, Rfl: 2   Allergies  Allergen Reactions   Shellfish Allergy Rash         Past Medical History:  Diagnosis Date   Anxiety    Depression      Past Surgical History:  Procedure Laterality Date   CESAREAN SECTION N/A 02/08/2020   Procedure: CESAREAN SECTION;  Surgeon: Florian Buff, MD;  Location: [facility];  Service: Obstetrics;  Laterality: N/A;    Family History  Problem Relation Age of Onset   Diabetes Maternal Grandmother    Cancer Maternal Grandfather        colon   Colon cancer Maternal Grandfather      Social History   Tobacco Use   Smoking status: Never    Passive exposure: Never   Smokeless tobacco: Never  Vaping Use   Vaping Use: Never used  Substance Use Topics   Alcohol use: Not Currently    Alcohol/week: 0.0 - 1.0 standard drinks of alcohol   Drug use: Not Currently    Types: Marijuana    Comment: occasionally. last smoked 2 wks ago    ROS   Objective:   Vitals: BP 119/75 (BP Location: Right Arm)   Pulse (!) 114   Temp 100 F (37.8 C) (Oral)   LMP 07/09/2022 (Within Days)   SpO2 96%   Physical Exam Constitutional:      General: She is not in acute distress.    Appearance: Normal appearance. She is well-developed and normal weight. She is not ill-appearing, toxic-appearing or diaphoretic.  HENT:     Head: Normocephalic and atraumatic.     Right Ear: Tympanic membrane, ear canal and external ear normal. No drainage or tenderness. No middle ear effusion. There is no impacted cerumen. Tympanic membrane is not erythematous or bulging.     Left Ear: Tympanic membrane, ear canal and external ear normal. No drainage or tenderness.  No middle ear effusion. There is no impacted cerumen. Tympanic membrane is not erythematous or bulging.     Nose: Congestion and rhinorrhea present.     Mouth/Throat:     Mouth: Mucous membranes are moist. No oral lesions.  Pharynx: No pharyngeal swelling, oropharyngeal exudate, posterior oropharyngeal erythema or uvula swelling.     Tonsils: No tonsillar exudate or tonsillar abscesses.  Eyes:     General: No scleral icterus.       Right eye: No discharge.        Left eye: No discharge.     Extraocular Movements: Extraocular movements intact.     Right eye: Normal extraocular motion.     Left eye: Normal extraocular motion.     Conjunctiva/sclera: Conjunctivae normal.  Cardiovascular:     Rate and Rhythm: Normal rate and regular rhythm.     Heart sounds: Normal heart sounds. No murmur heard.    No friction rub. No gallop.   Pulmonary:     Effort: Pulmonary effort is normal. No respiratory distress.     Breath sounds: No stridor. No wheezing, rhonchi or rales.  Chest:     Chest wall: No tenderness.  Musculoskeletal:     Cervical back: Normal range of motion and neck supple.  Lymphadenopathy:     Cervical: No cervical adenopathy.  Skin:    General: Skin is warm and dry.  Neurological:     General: No focal deficit present.     Mental Status: She is alert and oriented to person, place, and time.  Psychiatric:        Mood and Affect: Mood normal.        Behavior: Behavior normal.    DG Chest 2 View  Result Date: 08/12/2022 CLINICAL DATA:  cough, chest pain, shob EXAM: CHEST - 2 VIEW COMPARISON:  None Available. FINDINGS: The heart size and mediastinal contours are within normal limits. Both lungs are clear. No visible pleural effusions or pneumothorax. No acute osseous abnormality. IMPRESSION: No active cardiopulmonary disease. Electronically Signed   By: Margaretha Sheffield M.D.   On: 08/12/2022 16:28      Assessment and Plan :   PDMP not reviewed this encounter.  1. Acute viral syndrome   2. Sinus tachycardia   3. Atypical chest pain     Discussed antibiotic stewardship.  COVID testing is pending.  Patient declined steroid use for now. Will manage for viral illness such as viral URI, viral syndrome, viral rhinitis, COVID-19. Recommended supportive care. Offered scripts for symptomatic relief. Testing is pending. Counseled patient on potential for adverse effects with medications prescribed/recommended today, ER and return-to-clinic precautions discussed, patient verbalized understanding.   She should undergo COVID antiviral should she test positive for COVID-19.   Jaynee Eagles, PA-C 08/12/22 1726

## 2022-08-12 NOTE — Discharge Instructions (Signed)
We will notify you of your test results as they arrive and may take between about 24 hours.  I encourage you to sign up for MyChart if you have not already done so as this can be the easiest way for [place] to communicate results to you online or through a phone app.  Generally, we only contact you if it is a positive test result.  In the meantime, if you develop worsening symptoms including fever, chest pain, shortness of breath despite our current treatment plan then please report to the emergency room as this may be a sign of worsening status from possible viral infection.  Otherwise, we will manage this as a viral syndrome. For sore throat or cough try using a honey-based tea. Use 3 teaspoons of honey with juice squeezed from half lemon. Place shaved pieces of ginger into 1/2-1 cup of water and warm over stove top. Then mix the ingredients and repeat every 4 hours as needed. Please take Tylenol 500mg -650mg  every 6 hours for aches and pains, fevers. Hydrate very well with at least 2 liters of water. Eat light meals such as soups to replenish electrolytes and soft fruits, veggies. Start an antihistamine like Zyrtec (10mg  daily) for postnasal drainage, sinus congestion.  You can take this together with the Atrovent nasal spray 2-3 times a day as needed for the same kind of congestion.  Use the cough medications as needed.

## 2022-08-13 LAB — SARS CORONAVIRUS 2 (TAT 6-24 HRS): SARS Coronavirus 2: NEGATIVE

## 2022-08-14 ENCOUNTER — Ambulatory Visit: Payer: Medicaid Other

## 2022-08-21 ENCOUNTER — Ambulatory Visit: Payer: Medicaid Other | Attending: Student

## 2022-08-21 DIAGNOSIS — R293 Abnormal posture: Secondary | ICD-10-CM | POA: Diagnosis not present

## 2022-08-21 DIAGNOSIS — R279 Unspecified lack of coordination: Secondary | ICD-10-CM

## 2022-08-21 DIAGNOSIS — M62838 Other muscle spasm: Secondary | ICD-10-CM | POA: Insufficient documentation

## 2022-08-21 DIAGNOSIS — R102 Pelvic and perineal pain: Secondary | ICD-10-CM | POA: Diagnosis present

## 2022-08-21 DIAGNOSIS — M5459 Other low back pain: Secondary | ICD-10-CM | POA: Diagnosis present

## 2022-08-21 DIAGNOSIS — M6281 Muscle weakness (generalized): Secondary | ICD-10-CM

## 2022-08-21 NOTE — Therapy (Addendum)
OUTPATIENT PHYSICAL THERAPY TREATMENT NOTE   Patient Name: Michele Carney MRN: [id number] DOB:2001/05/02, 22 y.o., female Today's Date: 08/21/2022  PCP: Luetta Nutting, DO  REFERRING PROVIDER: Johnston Ebbs, NP   END OF SESSION:   PT End of Session - 08/21/22 1111     Visit Number 11    Date for PT Re-Evaluation 10/02/22    Authorization Type [employer]    Authorization Time Period 07/16/2022-09/13/2022    Authorization - Visit Number 2    Authorization - Number of Visits 5    PT Start Time 1110    PT Stop Time 1142    PT Time Calculation (min) 32 min    Activity Tolerance Patient tolerated treatment well    Behavior During Therapy WFL for tasks assessed/performed                     Past Medical History:  Diagnosis Date   Anxiety    Depression    Past Surgical History:  Procedure Laterality Date   CESAREAN SECTION N/A 02/08/2020   Procedure: CESAREAN SECTION;  Surgeon: Florian Buff, MD;  Location: [facility];  Service: Obstetrics;  Laterality: N/A;   Patient Active Problem List   Diagnosis Date Noted   Well adult exam 03/07/2022   Acute maxillary sinusitis 03/07/2022   Chronic bilateral low back pain without sciatica 11/30/2020   Iron deficiency anemia 02/10/2020   Cesarean delivery delivered 02/08/2020   Non-reactive NST (non-stress test) 02/06/2020   Significant discrepancy between uterine size and clinical dates, antepartum, second trimester 11/16/2019   Yeast vaginitis 10/14/2019   GBS (group B streptococcus) UTI complicating pregnancy 99991111   Supervision of normal first pregnancy, antepartum 07/05/2019   MDD (major depressive disorder) 05/25/2019   Chronic migraine without aura without status migrainosus, not intractable 12/30/2017   REFERRING DIAG: M62.89 (ICD-10-CM) - Pelvic floor dysfunction in female N94.10 (ICD-10-CM) - Dyspareunia in female R35.0 (ICD-10-CM) - Urinary frequency   THERAPY DIAG:  Abnormal posture    Muscle weakness (generalized)   Other muscle spasm   Unspecified lack of coordination   Other low back pain   Pelvic pain   Rationale for Evaluation and Treatment: Rehabilitation   ONSET DATE: 02/08/20   SUBJECTIVE: Pt states that she is not having hardly any low back pain. She has started having new pain in Rt buttock. She has been having more urgency and had 2 episodes of incontinence yesterday. She is also having more difficulty with bowel movements. She has been doing regular body scans and feels like these are helpful.  04/16/22 SUBJECTIVE STATEMENT: Pt states that low back pain started 2 years ago after having daughter and it seems to get worse around her cycle. She has had pain with intercourse that started around this time as well - been consistently bad since she had a yeast infection this past May. She will feel lower abdominal pain during intercourse. She will have sciatica on Lt side.      PAIN:  Are you having pain? Yes NPRS scale: 4/10 low back;  Pain location:  low back pain   Pain type: dull Pain description: aching    Aggravating factors: any activity, menstrual cycle, flexion Relieving factors: epsom salt baths, icy hot   PRECAUTIONS: None   WEIGHT BEARING RESTRICTIONS: No   FALLS:  Has patient fallen in last 6 months? 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   PLOF: Independent   PATIENT GOALS: decrease back pain and pain free intercourse; reduce tightness   PERTINENT HISTORY:  C/section Sexual abuse: No   BOWEL MOVEMENT: Pain with bowel movement: Yes - pain around the anus and in abdomen, 6/10 Type of bowel movement:Type (Bristol  Stool Scale) 3 - varies in consistency, Frequency at least 1x/day, and Strain Yes - she's trying to strain less, uses squatty potty Fully empty rectum: No Leakage: No Pads: No Fiber supplement: Noworking on eating more fiber   URINATION: Pain with urination: Yes - sometimes; gyn has ruled out infection multiple times , 6/10 Fully empty bladder: No Stream: Strong Urgency: Yes: immediately or will leak Frequency: 2-3x/hour Leakage: Urge to void, Walking to the bathroom, Laughing, and at night she will wake up wet (6-8x in the last month) Pads: No   INTERCOURSE: Pain with intercourse: Initial Penetration, During Penetration, and After Intercourse Ability to have vaginal penetration:  No Climax: normal Marinoff Scale: 0/3   PREGNANCY: Vaginal deliveries 0 Tearing No C-section deliveries 1 - she does have incisional pain and feels trauma surrounding delivery  Currently pregnant No   PROLAPSE: Feels like organs are trying to come out since delivery   OBJECTIVE:  07/10/22: Pelvic assessment: 2/5 strength still having difficulty with coordination 10 second hold with verbal cues for re-squeeze Pain and restriction at perineal body and throughout 1st layer Redness and labia minora fusion (Rt>Lt) Deep pelvic floor muscle tenderness/spasm bil PFIQ-7: 71  06/05/22:             External Perineal Exam pain along superficial transverse perineal/ischiocavernosus and inferior pubic ramus                             Internal Pelvic Floor Tenderness at posterior fourchette; pain throughout Rt superficial/deep pelvic floor (Lt with mild increase in tone, but no pain   Patient confirms identification and approves PT to assess internal pelvic floor and treatment Yes    PELVIC MMT:   MMT eval  Vaginal  1/5, poor relaxation  Internal Anal Sphincter    External Anal Sphincter    Puborectalis    Diastasis Recti    (Blank rows = not tested)         TONE: High     PATIENT SURVEYS:     PFIQ-7 81   COGNITION: Overall cognitive status: Within functional limits for tasks assessed  SENSATION: Light touch: Appears intact Proprioception: Appears intact   MUSCLE LENGTH: Hip flexor: inc low back pain on Lt; WNL Piriformis: restricted bil, pressure in lower abdomen IR: painful on Lt in groin/lower abdomen; WNL bil   FUNCTIONAL TESTS:              Squat: Large Rt weight shift (hurt Lt foot at work the other day)             Single leg stance: 10 sec bil, less stable on Lt             Stork: Lt stance with Rt pelvic drop              GAIT: Comments: WNL   POSTURE: forward head, increased lumbar lordosis, decreased thoracic kyphosis, anterior pelvic tilt, and Rt thoracic rotation (possible very mild Rt thoracic curvature)   LUMBARAROM/PROM:   A/PROM A/PROM  Eval (% available)  Flexion 100  Extension 50  Right lateral flexion 50  Left lateral flexion 50  Right rotation 50  Left rotation 50   (Blank rows = not tested)     LOWER EXTREMITY MMT:   MMT Right eval Left eval  Hip flexion 4 4  Hip extension 3 3  Hip abduction 4- 4-  Hip adduction 4- 4-  Hip internal rotation 4- 4-  Hip external rotation 4- 4-  Knee flexion      Knee extension      Ankle dorsiflexion      Ankle plantarflexion      Ankle inversion      Ankle eversion      *all reproduced low back pain; abd/adduction, ext, and bil hip rotation testing reproduced bil groin/pelvic pain PALPATION:   General  -1 finger width separation at umbilicus with distortion             -tenderness directly over Rt SIJ with surrounding muscle tension             -bil lumbar paraspinal tightness and tenderness from T12-L5        TODAY'S TREATMENT 08/07/22 Manual: Pt provides verbal consent for internal vaginal/rectal pelvic floor exam. Bil deep pelvic floor muscle release Neuromuscular re-education: Pelvic floor contraction training Urge drill practice Exercises: Seated hip  abduction green band 2 x 10 Supine bridge with hip adduction and heel press 2 x 10 Seated piriformis stretch 60 sec Rt Supine piriformis stretch 60 sec Rt Sidelying clam 10x Rt   TREATMENT 07/31/22 Neuromuscular re-education: Pelvic tilts 2 x 10 Supine UE ball press 2 x 10 Sidelying UE ball press 2 x 10 Exercises: Supine piriformis stretch 60 sec bil Supine hamstring 3 way stretch 60 sec bil Wide foot lower trunk rotation in propped sitting 2 x 10   TREATMENT 07/10/22 RE-EVAL Manual: Pt provides verbal consent for internal vaginal/rectal pelvic floor exam. Bil deep pelvic floor muscle release Therapeutic activities: Gentle cleansing after bowel movements - peri bottle/wet wipes Urge drill      PATIENT EDUCATION:  Education details: information on dry needling, scar massage Person educated: Patient Education method: Explanation, Demonstration, Tactile cues, Verbal cues, and Handouts Education comprehension: verbalized understanding   HOME EXERCISE PROGRAM: [id number]   ASSESSMENT:   CLINICAL IMPRESSION: Pt doing much better with low back pain focusing on stretching. However, believe that she also needs strengthening in order to keep other pain down and work on pelvic floor support for improved bowel.urinary function. She tolerated all stretches and exercises to help target specific Rt  gluteal area well. Internal vaginal exam demonstrated significant difficulty with pelvic floor contraction and weakness. She did very well with cues for improved contraction, although muscles still weak. We reviewed urge drill with this feedback and she performed well once coordination of contraction improved. Overall pelvic floor tension improved, but one tight band of Rt levator ani (puborectalis/pubococcygeus) present with good release. She will continue to benefit from skilled PT intervention in order to decrease pain, address impairments, return to intercourse without pain, and improve  bowel/bladder function in order to improve QOL.    OBJECTIVE IMPAIRMENTS: decreased activity tolerance, decreased coordination, decreased endurance, decreased strength, increased fascial restrictions, increased muscle spasms, impaired tone, postural dysfunction, and pain.    ACTIVITY LIMITATIONS: standing, squatting, sleeping, continence, and locomotion level   PARTICIPATION LIMITATIONS: interpersonal relationship, community activity, occupation, and school   PERSONAL FACTORS: 1 comorbidity: c/section  are also affecting patient's functional outcome.    REHAB POTENTIAL: Good   CLINICAL DECISION MAKING: Stable/uncomplicated   EVALUATION COMPLEXITY: Low     GOALS: Goals reviewed with patient? Yes   SHORT TERM GOALS: Target date: 05/14/22 - updated 05/22/22 - updated 07/03/22   Pt will be independent with HEP.    Baseline: Goal status: MET 07/03/22   2.  Pt will be able to correctly perform diaphragmatic breathing and appropriate pressure management in order to prevent worsening vaginal wall laxity and improve pelvic floor A/ROM.    Baseline:  Goal status: MET 07/03/22   3.  Pt will be independent with diaphragmatic breathing and down training activities in order to improve pelvic floor relaxation.   Baseline:  Goal status: MET 07/03/22   4.  Pt will be independent with the knack, urge suppression technique, and double voiding in order to improve bladder habits and decrease urinary incontinence.    Baseline: reviewed 07/10/22 Goal status: IN PROGRESS    5.  Pt will be independent with use of squatty potty, relaxed toileting mechanics, and improved bowel movement techniques in order to increase ease of bowel movements and complete evacuation.    Baseline:  Goal status: MET 07/03/22       LONG TERM GOALS: Target date: 07/09/2022 - updated 05/22/22 - updated 07/03/22 new goal 10/02/2022    Pt will be independent with advanced HEP.    Baseline:  Goal status: IN PROGRESS   2.  Pt  will demonstrate normal pelvic floor muscle tone and A/ROM, able to achieve 4/5 strength with contractions and 10 sec endurance, in order to provide appropriate lumbopelvic support in functional activities.    Baseline:  Goal status: IN PROGRESS   3.  Pt will be able to go 2-3 hours in between voids without urgency or incontinence in order to improve QOL and perform all functional activities with less difficulty.    Baseline:  Goal status: IN PROGRESS  4.  Pt will have regular bowel movement with normal consistent, no straining, and no pain.  Baseline:  Goal status: IN PROGRESS   5.  Pt will report 0/10 pain with vaginal penetration in order to improve intimate relationship with partner.     Baseline:  Goal status: IN PROGRESS   6.  Pt will demonstrate increase in all impaired hip strength by 1 muscle grades and bil negative stork test in order to demonstrate improved lumbopelvic support and increase functional ability.    Baseline:  Goal status: IN PROGRESS   PLAN:   PT FREQUENCY: 1-2x/week   PT DURATION: 12 weeks   PLANNED  INTERVENTIONS: Therapeutic exercises, Therapeutic activity, Neuromuscular re-education, Balance training, Gait training, Patient/Family education, Self Care, Joint mobilization, Dry Needling, Biofeedback, and Manual therapy   PLAN FOR NEXT SESSION: possible internal vaginal pelvic floor release; progress functional strengthening.   Julio Alm, PT, DPT04/07/2409:48 AM    PHYSICAL THERAPY DISCHARGE SUMMARY  Visits from Start of Care: 11  Current functional level related to goals / functional outcomes: Not independent   Remaining deficits: See above   Education / Equipment: HEP   Patient agrees to discharge. Patient goals were partially met. Patient is being discharged due to  poor attendance.  Julio Alm, PT, DPT04/17/2410:06 AM

## 2022-08-28 ENCOUNTER — Ambulatory Visit: Payer: Medicaid Other

## 2022-09-04 ENCOUNTER — Ambulatory Visit: Payer: Medicaid Other

## 2022-09-14 ENCOUNTER — Encounter: Payer: Self-pay | Admitting: *Deleted

## 2022-09-14 ENCOUNTER — Other Ambulatory Visit: Payer: Self-pay

## 2022-09-14 ENCOUNTER — Ambulatory Visit
Admission: EM | Admit: 2022-09-14 | Discharge: 2022-09-14 | Disposition: A | Discharge status: Home / Self Care | Payer: Medicaid Other | Attending: Emergency Medicine | Admitting: Emergency Medicine

## 2022-09-14 DIAGNOSIS — H6502 Acute serous otitis media, left ear: Secondary | ICD-10-CM | POA: Diagnosis not present

## 2022-09-14 MED ORDER — AMOXICILLIN 500 MG PO CAPS: 500.0000 mg | ORAL_CAPSULE | Freq: Two times a day (BID) | ORAL | 0 refills | Status: AC

## 2022-09-14 NOTE — Discharge Instructions (Addendum)
Declined avs  Today you are being treated for an infection of the eardrum  Take amoxicillin twice daily for 10 days, you should begin to see improvement after 48 hours of medication use and then it should progressively get better  You may use Tylenol or ibuprofen for management of discomfort  May hold warm compresses to the ear for additional comfort  Please not attempted any ear cleaning or object or fluid placement into the ear canal to prevent further irritation

## 2022-09-14 NOTE — ED Triage Notes (Signed)
Pt reports on Monday she had a cold with cough and later in the week her Lt ear started to hurt. Pt reports she hs a ear infection.

## 2022-09-14 NOTE — ED Provider Notes (Signed)
[facility]    CSN: [id number] Arrival date & time: 09/14/22  1216      History   Chief Complaint Chief Complaint  Patient presents with   Otalgia    HPI Michele Carney is a 22 y.o. female.   Presents for evaluation of left-sided ear pain beginning 3 days ago.  Has accompanying viral illness experiencing nasal congestion, cough and sore throat.  Sore throat and cough are improved with over-the-counter cold and flu medications but no improvement is seen with ear pain.  Denies fevers.  Denies drainage, pruritus or fullness.    Past Medical History:  Diagnosis Date   Anxiety    Depression     Patient Active Problem List   Diagnosis Date Noted   Well adult exam 03/07/2022   Acute maxillary sinusitis 03/07/2022   Chronic bilateral low back pain without sciatica 11/30/2020   Iron deficiency anemia 02/10/2020   Cesarean delivery delivered 02/08/2020   Non-reactive NST (non-stress test) 02/06/2020   Significant discrepancy between uterine size and clinical dates, antepartum, second trimester 11/16/2019   Yeast vaginitis 10/14/2019   GBS (group B streptococcus) UTI complicating pregnancy 08/31/2019   Supervision of normal first pregnancy, antepartum 07/05/2019   MDD (major depressive disorder) 05/25/2019   Chronic migraine without aura without status migrainosus, not intractable 12/30/2017    Past Surgical History:  Procedure Laterality Date   CESAREAN SECTION N/A 02/08/2020   Procedure: CESAREAN SECTION;  Surgeon: Lazaro Arms, MD;  Location: [facility];  Service: Obstetrics;  Laterality: N/A;    OB History     Gravida  2   Para  1   Term  1   Preterm      AB      Living  1      SAB      IAB      Ectopic      Multiple  0   Live Births  1            Home Medications    Prior to Admission medications   Medication Sig Start Date End Date Taking? Authorizing Provider  amoxicillin-clavulanate (AUGMENTIN) 875-125 MG tablet  Take 1 tablet by mouth every 12 (twelve) hours. 06/27/22   Tomi Bamberger, PA-C  cetirizine (ZYRTEC ALLERGY) 10 MG tablet Take 1 tablet (10 mg total) by mouth daily. 08/12/22   Wallis Bamberg, PA-C  fluconazole (DIFLUCAN) 150 MG tablet Take 150 mg by mouth daily.    [provider]  ibuprofen (ADVIL) 600 MG tablet Take 1 tablet (600 mg total) by mouth 3 (three) times daily. Patient not taking: Reported on 03/07/2022 02/05/22   Ellsworth Lennox, PA-C  ipratropium (ATROVENT) 0.03 % nasal spray Place 2 sprays into both nostrils 2 (two) times daily. 08/12/22   Wallis Bamberg, PA-C  lidocaine (XYLOCAINE) 2 % jelly Apply 1 application topically 3 (three) times daily. Patient not taking: Reported on 01/09/2022 03/19/21   Brand Males, CNM  ondansetron (ZOFRAN-ODT) 8 MG disintegrating tablet Take 1 tablet (8 mg total) by mouth every 8 (eight) hours as needed for nausea or vomiting. 08/12/22   Wallis Bamberg, PA-C  promethazine-dextromethorphan (PROMETHAZINE-DM) 6.25-15 MG/5ML syrup Take 5 mLs by mouth 3 (three) times daily as needed for cough. 08/12/22   Wallis Bamberg, PA-C    Family History Family History  Problem Relation Age of Onset   Diabetes Maternal Grandmother    Cancer Maternal Grandfather        colon  Colon cancer Maternal Grandfather     Social History Social History   Tobacco Use   Smoking status: Never    Passive exposure: Never   Smokeless tobacco: Never  Vaping Use   Vaping Use: Never used  Substance Use Topics   Alcohol use: Not Currently    Alcohol/week: 0.0 - 1.0 standard drinks of alcohol   Drug use: Not Currently    Types: Marijuana    Comment: occasionally. last smoked 2 wks ago     Allergies   Shellfish allergy   Review of Systems Review of Systems  HENT:  Positive for ear pain.      Physical Exam Triage Vital Signs ED Triage Vitals  Enc Vitals Group     BP 09/14/22 1306 121/80     Pulse --      Resp 09/14/22 1306 18     Temp 09/14/22 1306 97.9 F  (36.6 C)     Temp src --      SpO2 09/14/22 1306 95 %     Weight --      Height --      Head Circumference --      Peak Flow --      Pain Score 09/14/22 1304 5     Pain Loc --      Pain Edu? --      Excl. in GC? --    No data found.  Updated Vital Signs BP 121/80   Temp 97.9 F (36.6 C)   Resp 18   SpO2 95%   Visual Acuity Right Eye Distance:   Left Eye Distance:   Bilateral Distance:    Right Eye Near:   Left Eye Near:    Bilateral Near:     Physical Exam Constitutional:      Appearance: Normal appearance.  HENT:     Head: Normocephalic.     Right Ear: Hearing, tympanic membrane, ear canal and external ear normal.     Left Ear: Hearing, ear canal and external ear normal. Tympanic membrane is erythematous.     Nose: Congestion and rhinorrhea present.     Mouth/Throat:     Pharynx: No posterior oropharyngeal erythema.     Tonsils: No tonsillar exudate. 2+ on the right. 2+ on the left.  Eyes:     Extraocular Movements: Extraocular movements intact.  Pulmonary:     Effort: Pulmonary effort is normal.  Neurological:     Mental Status: She is alert and oriented to person, place, and time. Mental status is at baseline.      UC Treatments / Results  Labs (all labs ordered are listed, but only abnormal results are displayed) Labs Reviewed - No data to display  EKG   Radiology No results found.  Procedures Procedures (including critical care time)  Medications Ordered in UC Medications - No data to display  Initial Impression / Assessment and Plan / UC Course  I have reviewed the triage vital signs and the nursing notes.  Pertinent labs & imaging results that were available during my care of the patient were reviewed by me and considered in my medical decision making (see chart for details).  Recurrent acute serous otitis media of left ear  Erythema is present to the tympanic membrane without abnormality to the canal and external ear, discussed  findings with patient, prescribed amoxicillin and recommended additional supportive measures, advised against any ear cleaning or object placement within the canal, may follow-up if symptoms persist or worsen Final  Clinical Impressions(s) / UC Diagnoses   Final diagnoses:  None   Discharge Instructions   None    ED Prescriptions   None    PDMP not reviewed this encounter.   Valinda Hoar, NP 09/14/22 1331

## 2022-10-08 ENCOUNTER — Inpatient Hospital Stay (HOSPITAL_COMMUNITY)
Admission: EM | Admit: 2022-10-08 | Discharge: 2022-10-08 | Disposition: A | Discharge status: Home / Self Care | Payer: Medicaid Other | Attending: Obstetrics and Gynecology | Admitting: Obstetrics and Gynecology

## 2022-10-08 DIAGNOSIS — F1290 Cannabis use, unspecified, uncomplicated: Secondary | ICD-10-CM | POA: Diagnosis not present

## 2022-10-08 DIAGNOSIS — Z3202 Encounter for pregnancy test, result negative: Secondary | ICD-10-CM | POA: Insufficient documentation

## 2022-10-08 DIAGNOSIS — R102 Pelvic and perineal pain: Secondary | ICD-10-CM | POA: Diagnosis not present

## 2022-10-08 DIAGNOSIS — N939 Abnormal uterine and vaginal bleeding, unspecified: Secondary | ICD-10-CM | POA: Insufficient documentation

## 2022-10-08 DIAGNOSIS — Z79899 Other long term (current) drug therapy: Secondary | ICD-10-CM | POA: Diagnosis not present

## 2022-10-08 LAB — POCT PREGNANCY, URINE: Preg Test, Ur: NEGATIVE

## 2022-10-08 LAB — PREGNANCY, URINE: Preg Test, Ur: NEGATIVE

## 2022-10-08 NOTE — Progress Notes (Signed)
Written and verbal d/c instructions given and understanding voiced. 

## 2022-10-08 NOTE — MAU Note (Signed)
..  Michele Carney is a 22 y.o. at Unknown here in MAU reporting: Since Thursday evening 10/03/2022 right sided lower abdominal pain patient reports that it feels like it is in her "ovary/fallopian tube." Since Sunday she has had lower abdominal pressure. Feels like she has to poop but after she poops the pressure doesn't go away.  Took 1,000 mg of tylenol last night but did not have any relief.  For 2 days she has been having light vaginal bleeding that is like the beginning of a period.  Has not had a positive at home pregnancy test LMP: 09/25/2022  Pain score: 6/10  Vitals:   10/08/22 2009  BP: 126/69  Pulse: 92  Resp: 17  Temp: 99 F (37.2 C)  SpO2: 99%

## 2022-10-08 NOTE — Discharge Instructions (Signed)
Please make a visit with your GYN regarding the abnormal bleeding. They may want to consider an endometrial biopsy or a hysterosalpingogram. To stop your bleeding, consider a short term (5-7 days) course of combined oral contraceptives or medroxyprogesterone acetate.

## 2022-10-08 NOTE — MAU Provider Note (Signed)
MAU Provider Note  History  [id number]  Arrival date and time: 10/08/22 1945   Chief Complaint  Patient presents with   Abdominal Pain     HPI Michele Carney is a 22 y.o. G2P1001 at who presents for irregular bleeding and pelvic pain. She thought she was pregnant and needed a UPT. She c/o irregular periods since 2021. She has been worked up for AUB in January and her results were negative. Declined dizziness, shortness of breath, chest pain. Vaginal bleeding: Yes     OB History  Gravida Para Term Preterm AB Living  2 1 1     1   SAB IAB Ectopic Multiple Live Births        0 1    # Outcome Date GA Lbr Len/2nd Weight Sex Delivery Anes PTL Lv  2 Gravida           1 Term 02/08/20 [redacted]w[redacted]d  3495 g F CS-LTranv EPI  LIV     Past Medical History:  Diagnosis Date   Anxiety    Depression     Past Surgical History:  Procedure Laterality Date   CESAREAN SECTION N/A 02/08/2020   Procedure: CESAREAN SECTION;  Surgeon: Lazaro Arms, MD;  Location: [facility];  Service: Obstetrics;  Laterality: N/A;    Family History  Problem Relation Age of Onset   Diabetes Maternal Grandmother    Cancer Maternal Grandfather        colon   Colon cancer Maternal Grandfather     Social History   Socioeconomic History   Marital status: Significant Other    Spouse name: Not on file   Number of children: Not on file   Years of education: Not on file   Highest education level: High school graduate  Occupational History   Not on file  Tobacco Use   Smoking status: Never    Passive exposure: Never   Smokeless tobacco: Never  Vaping Use   Vaping Use: Never used  Substance and Sexual Activity   Alcohol use: Not Currently    Alcohol/week: 0.0 - 1.0 standard drinks of alcohol   Drug use: Not Currently    Types: Marijuana    Comment: occasionally. last smoked 2 wks ago   Sexual activity: Yes    Birth control/protection: Condom    Comment: on the patch prior  Other Topics Concern    Not on file  Social History Narrative   Not on file   Social Determinants of Health   Financial Resource Strain: Low Risk  (07/05/2019)   Overall Financial Resource Strain (CARDIA)    Difficulty of Paying Living Expenses: Not hard at all  Food Insecurity: No Food Insecurity (07/05/2019)   Hunger Vital Sign    Worried About Running Out of Food in the Last Year: Never true    Ran Out of Food in the Last Year: Never true  Transportation Needs: No Transportation Needs (07/05/2019)   PRAPARE - Administrator, Civil Service (Medical): No    Lack of Transportation (Non-Medical): No  Physical Activity: Not on file  Stress: No Stress Concern Present (07/05/2019)   [employer] of Occupational Health - Occupational Stress Questionnaire    Feeling of Stress : Not at all  Social Connections: Not on file  Intimate Partner Violence: Not At Risk (07/05/2019)   Humiliation, Afraid, Rape, and Kick questionnaire    Fear of Current or Ex-Partner: No    Emotionally Abused: No    Physically Abused:  No    Sexually Abused: No    Allergies  Allergen Reactions   Shellfish Allergy Rash         No current facility-administered medications on file prior to encounter.   Current Outpatient Medications on File Prior to Encounter  Medication Sig Dispense Refill   amoxicillin-clavulanate (AUGMENTIN) 875-125 MG tablet Take 1 tablet by mouth every 12 (twelve) hours. 14 tablet 0   cetirizine (ZYRTEC ALLERGY) 10 MG tablet Take 1 tablet (10 mg total) by mouth daily. 30 tablet 0   fluconazole (DIFLUCAN) 150 MG tablet Take 150 mg by mouth daily.     ibuprofen (ADVIL) 600 MG tablet Take 1 tablet (600 mg total) by mouth 3 (three) times daily. (Patient not taking: Reported on 03/07/2022) 30 tablet 0   ipratropium (ATROVENT) 0.03 % nasal spray Place 2 sprays into both nostrils 2 (two) times daily. 30 mL 0   lidocaine (XYLOCAINE) 2 % jelly Apply 1 application topically 3 (three) times daily. (Patient not  taking: Reported on 01/09/2022) 30 mL 2   ondansetron (ZOFRAN-ODT) 8 MG disintegrating tablet Take 1 tablet (8 mg total) by mouth every 8 (eight) hours as needed for nausea or vomiting. 20 tablet 0   promethazine-dextromethorphan (PROMETHAZINE-DM) 6.25-15 MG/5ML syrup Take 5 mLs by mouth 3 (three) times daily as needed for cough. 200 mL 0    ROS: Pertinent positives and negative per HPI, all others reviewed and negative  Physical Exam   BP 126/69 (BP Location: Right Arm)   Pulse 92   Temp 99 F (37.2 C) (Oral)   Resp 17   Ht 5\' 2"  (1.575 m)   Wt 104.8 kg   SpO2 99%   BMI 42.25 kg/m   Patient Vitals for the past 24 hrs:  BP Temp Temp src Pulse Resp SpO2 Height Weight  10/08/22 2009 126/69 99 F (37.2 C) Oral 92 17 99 % 5\' 2"  (1.575 m) 104.8 kg    Physical Exam Vitals reviewed.  Constitutional:      Appearance: Normal appearance.  HENT:     Head: Normocephalic and atraumatic.     Right Ear: External ear normal.     Left Ear: External ear normal.  Cardiovascular:     Rate and Rhythm: Normal rate and regular rhythm.  Pulmonary:     Effort: Pulmonary effort is normal.     Breath sounds: Normal breath sounds.  Abdominal:     General: Abdomen is flat.     Palpations: Abdomen is soft.  Skin:    General: Skin is warm.     Capillary Refill: Capillary refill takes less than 2 seconds.  Psychiatric:        Mood and Affect: Mood normal.      Labs Results for orders placed or performed during the hospital encounter of 10/08/22 (from the past 24 hour(s))  Pregnancy, urine     Status: None   Collection Time: 10/08/22  8:16 PM  Result Value Ref Range   Preg Test, Ur NEGATIVE NEGATIVE  Pregnancy, urine POC     Status: None   Collection Time: 10/08/22  8:20 PM  Result Value Ref Range   Preg Test, Ur NEGATIVE NEGATIVE    Imaging No results found.  MAU Course  Procedures Lab Orders         Pregnancy, urine         Pregnancy, urine POC    No orders of the defined types  were placed in this encounter.  Imaging Orders  No imaging studies ordered today    MDM mild   Assessment and Plan  1. Pelvic pain - Discharge patient    UPT negative. Discussed VB, seems like AUB. Pt has had w/up. Encouraged pt to make visit with GYN regarding the abnormal bleeding. Consider an endometrial biopsy or a hysterosalpingogram. Asked pt to consider a short term (5-7 days) course of combined oral contraceptives or medroxyprogesterone acetate. Pt mentioned she would think about these options and contact [facility].  Dispo: discharged to home in stable condition.  Discharge Instructions     Discharge patient   Complete by: As directed    Discharge disposition: 01-Home or Self Care   Discharge patient date: 10/08/2022      Allergies as of 10/08/2022       Reactions   Shellfish Allergy Rash           Medication List     TAKE these medications    amoxicillin-clavulanate 875-125 MG tablet Commonly known as: AUGMENTIN Take 1 tablet by mouth every 12 (twelve) hours.   cetirizine 10 MG tablet Commonly known as: ZyrTEC Allergy Take 1 tablet (10 mg total) by mouth daily.   fluconazole 150 MG tablet Commonly known as: DIFLUCAN Take 150 mg by mouth daily.   ibuprofen 600 MG tablet Commonly known as: ADVIL Take 1 tablet (600 mg total) by mouth 3 (three) times daily.   ipratropium 0.03 % nasal spray Commonly known as: ATROVENT Place 2 sprays into both nostrils 2 (two) times daily.   lidocaine 2 % jelly Commonly known as: XYLOCAINE Apply 1 application topically 3 (three) times daily.   ondansetron 8 MG disintegrating tablet Commonly known as: ZOFRAN-ODT Take 1 tablet (8 mg total) by mouth every 8 (eight) hours as needed for nausea or vomiting.   promethazine-dextromethorphan 6.25-15 MG/5ML syrup Commonly known as: PROMETHAZINE-DM Take 5 mLs by mouth 3 (three) times daily as needed for cough.        Myrtie Hawk, DO FMOB Fellow, Faculty  practice [facility], Center for [facility] 10/08/22  9:04 PM

## 2022-10-08 NOTE — MAU Note (Signed)
Dr Lanae Crumbly in Family Rm to see pt and discuss d/c plan and follow up

## 2022-10-24 ENCOUNTER — Ambulatory Visit: Payer: Medicaid Other | Admitting: Obstetrics and Gynecology

## 2022-11-11 ENCOUNTER — Encounter: Payer: Self-pay | Admitting: Family Medicine

## 2022-11-11 ENCOUNTER — Ambulatory Visit: Payer: Medicaid Other | Admitting: Family Medicine

## 2022-11-11 VITALS — BP 110/73 | HR 90 | Ht 62.0 in | Wt 233.0 lb

## 2022-11-11 DIAGNOSIS — R5383 Other fatigue: Secondary | ICD-10-CM | POA: Diagnosis not present

## 2022-11-11 DIAGNOSIS — L659 Nonscarring hair loss, unspecified: Secondary | ICD-10-CM | POA: Diagnosis not present

## 2022-11-11 DIAGNOSIS — D508 Other iron deficiency anemias: Secondary | ICD-10-CM | POA: Diagnosis not present

## 2022-11-11 DIAGNOSIS — M5431 Sciatica, right side: Secondary | ICD-10-CM | POA: Insufficient documentation

## 2022-11-11 DIAGNOSIS — F321 Major depressive disorder, single episode, moderate: Secondary | ICD-10-CM

## 2022-11-11 DIAGNOSIS — F411 Generalized anxiety disorder: Secondary | ICD-10-CM | POA: Insufficient documentation

## 2022-11-11 MED ORDER — METHYLPREDNISOLONE 4 MG PO TBPK: ORAL_TABLET | ORAL | 0 refills | Status: DC

## 2022-11-11 NOTE — Assessment & Plan Note (Addendum)
-   will re-order TSH level to see if this is the cause of hair loss. Also discussed that stress can cause hair loss. Pt does not dye hair or get highlights so this is not chemical breakage. She does not use heat products on hair either. Recommended if blood panel comes back negative may need to try biotin.

## 2022-11-11 NOTE — Assessment & Plan Note (Signed)
-   pt has pain of right sciatic nerve that radiates down right leg  - will go ahead and do medrol dose pack - recommend exercises to stretch sciatic nerve - if no better will need to do formal PT

## 2022-11-11 NOTE — Progress Notes (Signed)
Acute Office Visit  Subjective:     Patient ID: CALEB PRIGMORE, female    DOB: 2001/02/14, 22 y.o.   MRN: [id number]  Chief Complaint  Patient presents with   Weight Gain   Alopecia   Anxiety    HPI Patient is in today for acute visit with concerns of thyroid abnormalities. TSH was checked 4 months and was normal. Symptoms include hair falling out and weight gain. Has gained 30-40lbs. Did have a baby a few months back. Feels bloated.  Review of Systems  Constitutional:  Negative for chills and fever.  Respiratory:  Negative for cough and shortness of breath.   Cardiovascular:  Negative for chest pain.  Neurological:  Negative for headaches.  Endo/Heme/Allergies:        Hair loss Weight gain        Objective:    BP 110/73 (BP Location: Left Arm, Patient Position: Sitting, Cuff Size: Large)   Pulse 90   Ht 5\' 2"  (1.575 m)   Wt 233 lb (105.7 kg)   SpO2 98%   BMI 42.62 kg/m    Physical Exam Vitals and nursing note reviewed.  Constitutional:      General: She is not in acute distress.    Appearance: Normal appearance.  HENT:     Head: Normocephalic and atraumatic.     Right Ear: External ear normal.     Left Ear: External ear normal.     Nose: Nose normal.  Eyes:     Conjunctiva/sclera: Conjunctivae normal.  Cardiovascular:     Rate and Rhythm: Normal rate and regular rhythm.  Pulmonary:     Effort: Pulmonary effort is normal.     Breath sounds: Normal breath sounds.  Musculoskeletal:     Comments: Right sciatic pain  Neurological:     General: No focal deficit present.     Mental Status: She is alert and oriented to person, place, and time.  Psychiatric:        Mood and Affect: Mood normal.        Behavior: Behavior normal.        Thought Content: Thought content normal.        Judgment: Judgment normal.    GAD7: 15 PHQ9: 18   No results found for any visits on 11/11/22.      Assessment & Plan:   Problem List Items Addressed This  Visit       Nervous and Auditory   Sciatic nerve pain, right    - pt has pain of right sciatic nerve that radiates down right leg  - will go ahead and do medrol dose pack - recommend exercises to stretch sciatic nerve - if no better will need to do formal PT       Relevant Orders   Ambulatory referral to Physical Therapy     Other   Iron deficiency anemia   Relevant Orders   CBC   Iron, TIBC and Ferritin Panel   MDD (major depressive disorder)    - PHQ9: 18  - discussed medication vs therapy and pt would like to pursue therapy at this time.       Relevant Orders   Ambulatory referral to Psychology   Hair loss - Primary    - will re-order TSH level to see if this is the cause of hair loss. Also discussed that stress can cause hair loss. Pt does not dye hair or get highlights so this is not chemical breakage. She does  not use heat products on hair either. Recommended if blood panel comes back negative may need to try biotin.      Relevant Orders   TSH + free T4   GAD (generalized anxiety disorder)    - GAD7: 15  - pt notes being under more worry with her daughter and working  - is hesitant to start medication at this time but is agreeable to therapy - re-ordering TSH      Other Visit Diagnoses     Fatigue, unspecified type       Relevant Orders   Vitamin B12   Vitamin D (25 hydroxy)       Meds ordered this encounter  Medications   methylPREDNISolone (MEDROL DOSEPAK) 4 MG TBPK tablet    Sig: Follow instructions per pill pack    Dispense:  21 tablet    Refill:  0    Return in about 6 weeks (around 12/23/2022).  Charlton Amor, DO

## 2022-11-11 NOTE — Assessment & Plan Note (Signed)
-   PHQ9: 18  - discussed medication vs therapy and pt would like to pursue therapy at this time.

## 2022-11-11 NOTE — Assessment & Plan Note (Signed)
-   GAD7: 15  - pt notes being under more worry with her daughter and working  - is hesitant to start medication at this time but is agreeable to therapy - re-ordering TSH

## 2022-11-12 ENCOUNTER — Other Ambulatory Visit: Payer: Self-pay | Admitting: Family Medicine

## 2022-11-12 DIAGNOSIS — E559 Vitamin D deficiency, unspecified: Secondary | ICD-10-CM

## 2022-11-12 DIAGNOSIS — D509 Iron deficiency anemia, unspecified: Secondary | ICD-10-CM

## 2022-11-12 LAB — VITAMIN B12: Vitamin B-12: 355 pg/mL (ref 200–1100)

## 2022-11-12 LAB — CBC
HCT: 40.2 % (ref 35.0–45.0)
Hemoglobin: 12.4 g/dL (ref 11.7–15.5)
MCH: 23.5 pg — ABNORMAL LOW (ref 27.0–33.0)
MCHC: 30.8 g/dL — ABNORMAL LOW (ref 32.0–36.0)
MCV: 76.1 fL — ABNORMAL LOW (ref 80.0–100.0)
MPV: 10.7 fL (ref 7.5–12.5)
Platelets: 265 10*3/uL (ref 140–400)
RBC: 5.28 10*6/uL — ABNORMAL HIGH (ref 3.80–5.10)
RDW: 15.3 % — ABNORMAL HIGH (ref 11.0–15.0)
WBC: 7.7 10*3/uL (ref 3.8–10.8)

## 2022-11-12 LAB — VITAMIN D 25 HYDROXY (VIT D DEFICIENCY, FRACTURES): Vit D, 25-Hydroxy: 15 ng/mL — ABNORMAL LOW (ref 30–100)

## 2022-11-12 LAB — TSH+FREE T4: TSH W/REFLEX TO FT4: 0.66 mIU/L

## 2022-11-12 LAB — IRON,TIBC AND FERRITIN PANEL
%SAT: 12 % (calc) — ABNORMAL LOW (ref 16–45)
Ferritin: 7 ng/mL — ABNORMAL LOW (ref 16–154)
Iron: 47 ug/dL (ref 40–190)
TIBC: 386 mcg/dL (calc) (ref 250–450)

## 2022-11-12 MED ORDER — CHOLECALCIFEROL 20 MCG (800 UNIT) PO TABS: 1.0000 | ORAL_TABLET | Freq: Every day | ORAL | 0 refills | Status: DC

## 2022-11-14 ENCOUNTER — Encounter: Payer: Self-pay | Admitting: Obstetrics

## 2022-11-14 ENCOUNTER — Other Ambulatory Visit (HOSPITAL_COMMUNITY)
Admission: RE | Admit: 2022-11-14 | Discharge: 2022-11-14 | Disposition: A | Discharge status: Home / Self Care | Payer: Medicaid Other | Source: Ambulatory Visit | Attending: Obstetrics and Gynecology | Admitting: Obstetrics and Gynecology

## 2022-11-14 ENCOUNTER — Ambulatory Visit: Payer: Medicaid Other | Admitting: Obstetrics

## 2022-11-14 VITALS — BP 115/81 | HR 92 | Ht 62.0 in | Wt 232.0 lb

## 2022-11-14 DIAGNOSIS — G8929 Other chronic pain: Secondary | ICD-10-CM

## 2022-11-14 DIAGNOSIS — Z01419 Encounter for gynecological examination (general) (routine) without abnormal findings: Secondary | ICD-10-CM | POA: Insufficient documentation

## 2022-11-14 DIAGNOSIS — R300 Dysuria: Secondary | ICD-10-CM | POA: Diagnosis not present

## 2022-11-14 DIAGNOSIS — M6289 Other specified disorders of muscle: Secondary | ICD-10-CM

## 2022-11-14 DIAGNOSIS — Z113 Encounter for screening for infections with a predominantly sexual mode of transmission: Secondary | ICD-10-CM | POA: Diagnosis not present

## 2022-11-14 DIAGNOSIS — N898 Other specified noninflammatory disorders of vagina: Secondary | ICD-10-CM | POA: Diagnosis not present

## 2022-11-14 DIAGNOSIS — R102 Pelvic and perineal pain: Secondary | ICD-10-CM

## 2022-11-14 DIAGNOSIS — N926 Irregular menstruation, unspecified: Secondary | ICD-10-CM

## 2022-11-14 DIAGNOSIS — N941 Unspecified dyspareunia: Secondary | ICD-10-CM

## 2022-11-14 DIAGNOSIS — G43709 Chronic migraine without aura, not intractable, without status migrainosus: Secondary | ICD-10-CM

## 2022-11-14 LAB — POCT URINALYSIS DIPSTICK
Blood, UA: NEGATIVE
Glucose, UA: NEGATIVE
Nitrite, UA: NEGATIVE
Protein, UA: NEGATIVE
Spec Grav, UA: 1.015 (ref 1.010–1.025)
Urobilinogen, UA: 0.2 E.U./dL
pH, UA: 6.5 (ref 5.0–8.0)

## 2022-11-14 NOTE — Progress Notes (Signed)
Subjective:        Michele Carney is a 22 y.o. female here for a routine exam.  Current complaints: Painful periods that are irregular.  Also has pain with intercourse, urinary frequency and burning with urination..    Personal health questionnaire:  Is patient Ashkenazi Jewish, have a family history of breast and/or ovarian cancer: no Is there a family history of uterine cancer diagnosed at age < 58, gastrointestinal cancer, urinary tract cancer, family member who is a Personnel officer syndrome-associated carrier: yes Is the patient overweight and hypertensive, family history of diabetes, personal history of gestational diabetes, preeclampsia or PCOS: no Is patient over 17, have PCOS,  family history of premature CHD under age 23, diabetes, smoke, have hypertension or peripheral artery disease:  no At any time, has a partner hit, kicked or otherwise hurt or frightened you?: no Over the past 2 weeks, have you felt down, depressed or hopeless?: no Over the past 2 weeks, have you felt little interest or pleasure in doing things?:no   Gynecologic History Patient's last menstrual period was 09/22/2022 (exact date). Contraception: none Last Pap: 2022. Results were: normal Last mammogram: n/a. Results were: n/a  Obstetric History OB History  Gravida Para Term Preterm AB Living  2 1 1     1   SAB IAB Ectopic Multiple Live Births        0 1    # Outcome Date GA Lbr Len/2nd Weight Sex Delivery Anes PTL Lv  2 Gravida           1 Term 02/08/20 [redacted]w[redacted]d  7 lb 11.3 oz (3.495 kg) F CS-LTranv EPI  LIV    Past Medical History:  Diagnosis Date   Anxiety    Depression     Past Surgical History:  Procedure Laterality Date   CESAREAN SECTION N/A 02/08/2020   Procedure: CESAREAN SECTION;  Surgeon: Lazaro Arms, MD;  Location: [facility] LD ORS;  Service: Obstetrics;  Laterality: N/A;     Current Outpatient Medications:    Cholecalciferol 20 MCG (800 UNIT) TABS, Take 1 tablet by mouth daily., Disp:  75 tablet, Rfl: 0   methylPREDNISolone (MEDROL DOSEPAK) 4 MG TBPK tablet, Follow instructions per pill pack (Patient not taking: Reported on 11/14/2022), Disp: 21 tablet, Rfl: 0 Allergies  Allergen Reactions   Shellfish Allergy Rash         Social History   Tobacco Use   Smoking status: Never    Passive exposure: Never   Smokeless tobacco: Never  Substance Use Topics   Alcohol use: Not Currently    Alcohol/week: 0.0 - 1.0 standard drinks of alcohol    Family History  Problem Relation Age of Onset   Diabetes Maternal Grandmother    Cancer Maternal Grandfather        colon   Colon cancer Maternal Grandfather       Review of Systems  Constitutional: negative for fatigue and weight loss Respiratory: negative for cough and wheezing Cardiovascular: negative for chest pain, fatigue and palpitations Gastrointestinal: negative for abdominal pain and change in bowel habits Musculoskeletal:negative for myalgias Neurological: negative for gait problems and tremors Behavioral/Psych: negative for abusive relationship, depression Endocrine: negative for temperature intolerance    Genitourinary: positive for irregular painful periods, vaginal discharge, chronic pelvic pain and dysuria.  negative for  genital lesions, hot flashes, sexual problems  Integument/breast: negative for breast lump, breast tenderness, nipple discharge and skin lesion(s)    Objective:       BP  115/81   Pulse 92   Ht 5\' 2"  (1.575 m)   Wt 232 lb (105.2 kg)   LMP 09/22/2022 (Exact Date)   BMI 42.43 kg/m  General:   Alert and no distress  Skin:   no rash or abnormalities  Lungs:   clear to auscultation bilaterally  Heart:   regular rate and rhythm, S1, S2 normal, no murmur, click, rub or gallop  Breasts:   normal without suspicious masses, skin or nipple changes or axillary nodes  Abdomen:  normal findings: no organomegaly, soft, non-tender and no hernia  Pelvis:  External genitalia: normal general  appearance Urinary system: urethral meatus normal and bladder without fullness, nontender Vaginal: normal without tenderness, induration or masses Cervix: normal appearance Adnexa: normal bimanual exam Uterus: anteverted and non-tender, normal size   Lab Review Urine pregnancy test Labs reviewed yes Radiologic studies reviewed yes  I have spent a total of 25 minutes of face-to-face time, excluding clinical staff time, reviewing notes and preparing to see patient, ordering tests and/or medications, and counseling the patient.   Assessment:    1. Encounter for gynecological examination with Papanicolaou smear of cervix Rx: - Cytology - PAP  2. Vaginal discharge Rx: - Cervicovaginal ancillary only( [facility])  3. Screening for STD (sexually transmitted disease) Rx: - HIV Antibody (routine testing w rflx) - Hepatitis B surface antigen - RPR - Hepatitis C antibody  4. Dysuria Rx: - POCT Urinalysis Dipstick - Urine Culture  5. Chronic pelvic pain in female Rx: - Ambulatory referral to Urogynecology  6. Pelvic floor dysfunction in female Rx: - same as above  7. Irregular periods/menstrual cycles - OCP's recommended  8. Dyspareunia in female - referred to UroGyn  9. Chronic migraine without aura without status migrainosus, not intractable - managed by PCP, as above     Plan:    Education reviewed: calcium supplements, depression evaluation, low fat, low cholesterol diet, safe sex/STD prevention, self breast exams, and weight bearing exercise. Contraception: options discussed.  Considering OCP's. Follow up in: 1 year.    Orders Placed This Encounter  Procedures   Urine Culture   HIV Antibody (routine testing w rflx)   Hepatitis B surface antigen   RPR   Hepatitis C antibody   Ambulatory referral to Urogynecology    Referral Priority:   Routine    Referral Type:   Consultation    Referral Reason:   Specialty Services Required    Requested Specialty:    Urology    Number of Visits Requested:   1   POCT Urinalysis Dipstick     Brock Bad, MD 11/14/2022 12:49 PM

## 2022-11-14 NOTE — Progress Notes (Addendum)
22 y.o. GYN presents for AUB, irregular periods, pelvic pain 5/10 x 1+ year.  Pt is missing periods, she had periods in 8/23; 11/23; 2/24; 5/24.  Pt uses Condom for [facility].

## 2022-11-15 ENCOUNTER — Telehealth: Payer: Self-pay

## 2022-11-15 LAB — CERVICOVAGINAL ANCILLARY ONLY
Bacterial Vaginitis (gardnerella): NEGATIVE
Candida Glabrata: NEGATIVE
Candida Vaginitis: POSITIVE — AB
Chlamydia: NEGATIVE
Comment: NEGATIVE
Comment: NEGATIVE
Comment: NEGATIVE
Comment: NEGATIVE
Comment: NEGATIVE
Comment: NORMAL
Neisseria Gonorrhea: NEGATIVE
Trichomonas: NEGATIVE

## 2022-11-15 LAB — HIV ANTIBODY (ROUTINE TESTING W REFLEX): HIV Screen 4th Generation wRfx: NONREACTIVE

## 2022-11-15 LAB — HEPATITIS B SURFACE ANTIGEN: Hepatitis B Surface Ag: NEGATIVE

## 2022-11-15 LAB — HEPATITIS C ANTIBODY: Hep C Virus Ab: REACTIVE — AB

## 2022-11-15 LAB — RPR: RPR Ser Ql: NONREACTIVE

## 2022-11-15 NOTE — Telephone Encounter (Signed)
Patient called and left message on vm to discuss results. Returned patient call. No answer. Left vm for patient to return call to the office

## 2022-11-18 LAB — URINE CULTURE

## 2022-11-19 ENCOUNTER — Ambulatory Visit: Payer: Medicaid Other | Admitting: Physical Therapy

## 2022-11-19 LAB — CYTOLOGY - PAP: Diagnosis: NEGATIVE

## 2022-11-20 ENCOUNTER — Telehealth: Payer: Self-pay

## 2022-11-20 NOTE — Telephone Encounter (Signed)
Patient left voicemail on triage line. Attempted to call the patient back, no answer.

## 2022-11-25 ENCOUNTER — Encounter: Payer: Self-pay | Admitting: Family Medicine

## 2022-11-25 ENCOUNTER — Encounter: Payer: Self-pay | Admitting: Obstetrics

## 2022-11-25 ENCOUNTER — Other Ambulatory Visit: Payer: Self-pay | Admitting: Family Medicine

## 2022-11-25 DIAGNOSIS — E559 Vitamin D deficiency, unspecified: Secondary | ICD-10-CM

## 2022-11-26 ENCOUNTER — Telehealth: Payer: Medicaid Other

## 2022-11-26 ENCOUNTER — Inpatient Hospital Stay: Payer: Medicaid Other | Admitting: Family

## 2022-11-26 ENCOUNTER — Inpatient Hospital Stay: Payer: Medicaid Other

## 2022-11-26 ENCOUNTER — Other Ambulatory Visit: Payer: Self-pay | Admitting: Emergency Medicine

## 2022-11-26 DIAGNOSIS — R768 Other specified abnormal immunological findings in serum: Secondary | ICD-10-CM

## 2022-11-26 MED ORDER — FLUCONAZOLE 150 MG PO TABS: 150.0000 mg | ORAL_TABLET | Freq: Once | ORAL | 1 refills | Status: AC

## 2022-11-26 MED ORDER — SULFAMETHOXAZOLE-TRIMETHOPRIM 800-160 MG PO TABS: 1.0000 | ORAL_TABLET | Freq: Two times a day (BID) | ORAL | 0 refills | Status: DC

## 2022-11-26 NOTE — Progress Notes (Signed)
Rx for UTI and yeast per Donavan Foil. Scheduled for hep C RNA lab on 7/10.

## 2022-11-27 ENCOUNTER — Other Ambulatory Visit: Payer: Medicaid Other

## 2022-11-28 ENCOUNTER — Other Ambulatory Visit: Payer: Medicaid Other

## 2022-11-28 ENCOUNTER — Other Ambulatory Visit: Payer: Self-pay | Admitting: Obstetrics and Gynecology

## 2022-11-29 ENCOUNTER — Ambulatory Visit
Admission: EM | Admit: 2022-11-29 | Discharge: 2022-11-29 | Disposition: A | Discharge status: Home / Self Care | Payer: Medicaid Other | Attending: Nurse Practitioner | Admitting: Nurse Practitioner

## 2022-11-29 ENCOUNTER — Ambulatory Visit (INDEPENDENT_AMBULATORY_CARE_PROVIDER_SITE_OTHER): Payer: Medicaid Other

## 2022-11-29 DIAGNOSIS — S93401A Sprain of unspecified ligament of right ankle, initial encounter: Secondary | ICD-10-CM | POA: Diagnosis not present

## 2022-11-29 DIAGNOSIS — M25571 Pain in right ankle and joints of right foot: Secondary | ICD-10-CM

## 2022-11-29 NOTE — ED Triage Notes (Signed)
Pt presents to UC w/ c/o right ankle bruising, tingling after twisting ankle on pavement 3 days ago. Limping while walking.

## 2022-11-29 NOTE — ED Provider Notes (Signed)
[facility]    CSN: [id number] Arrival date & time: 11/29/22  1250      History   Chief Complaint No chief complaint on file.   HPI Michele Carney is a 22 y.o. female.   Patient presents today with right ankle pain ongoing for the past 3 days.  Reports prior to symptoms starting, she twisted her ankle inward walking on uneven pavement to the grass.  She endorses bruising and swelling however denies redness, fevers, numbness or tingling in the toes.  Reports the pain is on the outside of her ankle and is worse with weightbearing.  Reports she works long shifts and is requesting a note for work.  Has tried rest, ice, compression, and elevation for the pain with minimal improvement.    Past Medical History:  Diagnosis Date   Anxiety    Depression     Patient Active Problem List   Diagnosis Date Noted   Hair loss 11/11/2022   Sciatic nerve pain, right 11/11/2022   GAD (generalized anxiety disorder) 11/11/2022   Well adult exam 03/07/2022   Acute maxillary sinusitis 03/07/2022   Chronic bilateral low back pain without sciatica 11/30/2020   Iron deficiency anemia 02/10/2020   Cesarean delivery delivered 02/08/2020   Non-reactive NST (non-stress test) 02/06/2020   Significant discrepancy between uterine size and clinical dates, antepartum, second trimester 11/16/2019   Yeast vaginitis 10/14/2019   GBS (group B streptococcus) UTI complicating pregnancy 08/31/2019   Supervision of normal first pregnancy, antepartum 07/05/2019   MDD (major depressive disorder) 05/25/2019   Chronic migraine without aura without status migrainosus, not intractable 12/30/2017    Past Surgical History:  Procedure Laterality Date   CESAREAN SECTION N/A 02/08/2020   Procedure: CESAREAN SECTION;  Surgeon: Lazaro Arms, MD;  Location: [facility];  Service: Obstetrics;  Laterality: N/A;    OB History     Gravida  2   Para  1   Term  1   Preterm      AB      Living  1       SAB      IAB      Ectopic      Multiple  0   Live Births  1            Home Medications    Prior to Admission medications   Medication Sig Start Date End Date Taking? Authorizing Provider  Cholecalciferol 20 MCG (800 UNIT) TABS Take 1 tablet by mouth daily. 11/12/22   Charlton Amor, DO  methylPREDNISolone (MEDROL DOSEPAK) 4 MG TBPK tablet Follow instructions per pill pack Patient not taking: Reported on 11/14/2022 11/11/22   Charlton Amor, DO    Family History Family History  Problem Relation Age of Onset   Diabetes Maternal Grandmother    Cancer Maternal Grandfather        colon   Colon cancer Maternal Grandfather     Social History Social History   Tobacco Use   Smoking status: Never    Passive exposure: Never   Smokeless tobacco: Never  Vaping Use   Vaping status: Never Used  Substance Use Topics   Alcohol use: Not Currently    Alcohol/week: 0.0 - 1.0 standard drinks of alcohol   Drug use: Not Currently    Types: Marijuana    Comment: occasionally. last smoked 2 wks ago     Allergies   Shellfish allergy   Review of Systems Review of Systems  Per HPI  Physical Exam Triage Vital Signs ED Triage Vitals  Encounter Vitals Group     BP 11/29/22 1303 114/72     Systolic BP Percentile --      Diastolic BP Percentile --      Pulse Rate 11/29/22 1303 (!) 110     Resp 11/29/22 1303 16     Temp 11/29/22 1303 98.7 F (37.1 C)     Temp Source 11/29/22 1303 Oral     SpO2 11/29/22 1303 97 %     Weight --      Height --      Head Circumference --      Peak Flow --      Pain Score 11/29/22 1306 5     Pain Loc --      Pain Education --      Exclude from Growth Chart --    No data found.  Updated Vital Signs BP 114/72 (BP Location: Right Arm)   Pulse (!) 110   Temp 98.7 F (37.1 C) (Oral)   Resp 16   LMP 09/22/2022 (Exact Date)   SpO2 97%   Visual Acuity Right Eye Distance:   Left Eye Distance:   Bilateral Distance:    Right Eye  Near:   Left Eye Near:    Bilateral Near:     Physical Exam Vitals and nursing note reviewed.  Constitutional:      General: She is not in acute distress.    Appearance: Normal appearance. She is not toxic-appearing.  HENT:     Mouth/Throat:     Mouth: Mucous membranes are moist.     Pharynx: Oropharynx is clear.  Pulmonary:     Effort: Pulmonary effort is normal. No respiratory distress.  Musculoskeletal:     Comments: Inspection: Mild swelling to the right lateral malleolus with bruising inferiorly to the malleolus; no obvious deformity, redness  Palpation: tender to palpation to the right lateral malleolus diffusely; no obvious deformities palpated ROM: Full ROM to right ankle and flexibility of foot Strength: 5/5 bilateral lower extremities Neurovascular: neurovascularly intact in bilateral lower extremities  Skin:    General: Skin is warm and dry.     Capillary Refill: Capillary refill takes less than 2 seconds.     Coloration: Skin is not jaundiced or pale.     Findings: No erythema.  Neurological:     Mental Status: She is alert and oriented to person, place, and time.  Psychiatric:        Behavior: Behavior is cooperative.      UC Treatments / Results  Labs (all labs ordered are listed, but only abnormal results are displayed) Labs Reviewed - No data to display  EKG   Radiology DG Ankle Complete Right  Result Date: 11/29/2022 CLINICAL DATA:  Right ankle pain after twisting. EXAM: RIGHT ANKLE - COMPLETE 3+ VIEW COMPARISON:  None Available. FINDINGS: There is no evidence of fracture, dislocation, or joint effusion. There is no evidence of arthropathy or other focal bone abnormality. Soft tissues swelling about the lateral malleolus. IMPRESSION: 1. No acute fracture or dislocation. 2. Soft tissue swelling about the lateral malleolus. Electronically Signed   By: Larose Hires D.O.   On: 11/29/2022 13:48    Procedures Procedures (including critical care  time)  Medications Ordered in UC Medications - No data to display  Initial Impression / Assessment and Plan / UC Course  I have reviewed the triage vital signs and the nursing notes.  Pertinent  labs & imaging results that were available during my care of the patient were reviewed by me and considered in my medical decision making (see chart for details).   Patient is well-appearing, normotensive, afebrile, not tachypneic, oxygenating well on room air.  Patient is mildly tachycardic in triage today.  1. Sprain of right ankle, unspecified ligament, initial encounter Extremities is negative for acute bony abnormality Recommended rest, ice, compression, elevation Ace wrap applied today Start Tylenol 500 to 1000 mg every 6 hours as needed for pain Note given for work ER and return precautions discussed with patient  The patient was given the opportunity to ask questions.  All questions answered to their satisfaction.  The patient is in agreement to this plan.    Final Clinical Impressions(s) / UC Diagnoses   Final diagnoses:  Sprain of right ankle, unspecified ligament, initial encounter     Discharge Instructions      The ankle x-ray today does not show any broken bones.  You have sprained your ankle.  Please wear the Ace wrap when you are up walking on your ankle.  When sitting down, keep it elevated and apply ice 15 minutes on, 45 minutes off every hour while awake.  You can take Tylenol 500 to 1000 mg every 6 hours as needed for pain.    ED Prescriptions   None    PDMP not reviewed this encounter.   Valentino Nose, NP 11/29/22 1427

## 2022-11-29 NOTE — Discharge Instructions (Addendum)
The ankle x-ray today does not show any broken bones.  You have sprained your ankle.  Please wear the Ace wrap when you are up walking on your ankle.  When sitting down, keep it elevated and apply ice 15 minutes on, 45 minutes off every hour while awake.  You can take Tylenol 500 to 1000 mg every 6 hours as needed for pain.

## 2022-11-30 LAB — HCV RNA QUANT: Hepatitis C Quantitation: NOT DETECTED IU/mL

## 2022-12-17 ENCOUNTER — Ambulatory Visit: Payer: Medicaid Other

## 2022-12-17 ENCOUNTER — Inpatient Hospital Stay: Payer: Medicaid Other | Attending: Family | Admitting: Family

## 2022-12-17 ENCOUNTER — Inpatient Hospital Stay: Payer: Medicaid Other

## 2022-12-17 ENCOUNTER — Encounter: Payer: Self-pay | Admitting: Family

## 2022-12-17 VITALS — BP 121/63 | HR 89 | Temp 98.6°F | Resp 17 | Wt 230.1 lb

## 2022-12-17 DIAGNOSIS — R200 Anesthesia of skin: Secondary | ICD-10-CM | POA: Diagnosis not present

## 2022-12-17 DIAGNOSIS — R0602 Shortness of breath: Secondary | ICD-10-CM | POA: Insufficient documentation

## 2022-12-17 DIAGNOSIS — Z800 Family history of malignant neoplasm of digestive organs: Secondary | ICD-10-CM | POA: Diagnosis not present

## 2022-12-17 DIAGNOSIS — R531 Weakness: Secondary | ICD-10-CM | POA: Insufficient documentation

## 2022-12-17 DIAGNOSIS — R140 Abdominal distension (gaseous): Secondary | ICD-10-CM | POA: Diagnosis not present

## 2022-12-17 DIAGNOSIS — R5383 Other fatigue: Secondary | ICD-10-CM | POA: Insufficient documentation

## 2022-12-17 DIAGNOSIS — R202 Paresthesia of skin: Secondary | ICD-10-CM | POA: Insufficient documentation

## 2022-12-17 DIAGNOSIS — F32A Depression, unspecified: Secondary | ICD-10-CM | POA: Diagnosis not present

## 2022-12-17 DIAGNOSIS — R609 Edema, unspecified: Secondary | ICD-10-CM | POA: Insufficient documentation

## 2022-12-17 DIAGNOSIS — D500 Iron deficiency anemia secondary to blood loss (chronic): Secondary | ICD-10-CM

## 2022-12-17 DIAGNOSIS — Z79899 Other long term (current) drug therapy: Secondary | ICD-10-CM | POA: Diagnosis not present

## 2022-12-17 DIAGNOSIS — G4700 Insomnia, unspecified: Secondary | ICD-10-CM | POA: Insufficient documentation

## 2022-12-17 DIAGNOSIS — R002 Palpitations: Secondary | ICD-10-CM | POA: Insufficient documentation

## 2022-12-17 DIAGNOSIS — D509 Iron deficiency anemia, unspecified: Secondary | ICD-10-CM | POA: Diagnosis present

## 2022-12-17 DIAGNOSIS — Z833 Family history of diabetes mellitus: Secondary | ICD-10-CM | POA: Insufficient documentation

## 2022-12-17 DIAGNOSIS — F419 Anxiety disorder, unspecified: Secondary | ICD-10-CM | POA: Insufficient documentation

## 2022-12-17 NOTE — Progress Notes (Signed)
Hematology/Oncology Consultation   Name: Michele SALEY      MRN: [id number]    Location: Room/bed info not found  Date: 12/17/2022 Time:10:57 AM   REFERRING PHYSICIAN:  Morey Hummingbird, DO  REASON FOR CONSULT:  Iron deficiency anemia   DIAGNOSIS: Iron deficiency anemia   HISTORY OF PRESENT ILLNESS:  Michele Carney is a pleasant 22 yo female with history of IDA. This was first addressed during her pregnancy in 2021.  She has received IV iron in the past. Her last dose was given on 02/09/2020. She denies any issue with adverse reaction.  Recent iron saturation was only 12% and ferritin 7.  Her cycle has been irregular, skipping months since August 2023. She has seen her gynecologist and just does not want to go on any hormonal therapy due to side effects and how it makes her feel.  Her cycle has been heavy at times but most recently flow has been normal.  No other blood loss noted. No bruising or petechiae.  Her mother also has history of IDA.  She is symptomatic with fatigue, weakness, SOB with exertion, occasional palpitations, insomnia, numbness and tingling off and on in her finger tips as well as abdominal bloating and fluid retention at times in her face.  No personal history of cancer. Her maternal grandfather had colon and maternal grandmother had breast.  No history of diabetes or thyroid disease.  No fever, chills, n/v, cough, rash, chest pain, abdominal pain or changes in bowel or bladder habits. No falls or syncope reported.  Appetite is fair and hydration good. Weight is 230 lbs.  She stays quit busy with her sweet 2 yo daughter as well as working full time at [employer].  She is hoping to get into nursing school in the future.   ROS: All other 10 point review of systems is negative.   PAST MEDICAL HISTORY:   Past Medical History:  Diagnosis Date   Anxiety    Depression     ALLERGIES: Allergies  Allergen Reactions   Shellfish Allergy Rash            MEDICATIONS:  Current Outpatient Medications on File Prior to Visit  Medication Sig Dispense Refill   Cholecalciferol 20 MCG (800 UNIT) TABS Take 1 tablet by mouth daily. 75 tablet 0   methylPREDNISolone (MEDROL DOSEPAK) 4 MG TBPK tablet Follow instructions per pill pack (Patient not taking: Reported on 11/14/2022) 21 tablet 0   No current facility-administered medications on file prior to visit.     PAST SURGICAL HISTORY Past Surgical History:  Procedure Laterality Date   CESAREAN SECTION N/A 02/08/2020   Procedure: CESAREAN SECTION;  Surgeon: Lazaro Arms, MD;  Location: [facility];  Service: Obstetrics;  Laterality: N/A;    FAMILY HISTORY: Family History  Problem Relation Age of Onset   Diabetes Maternal Grandmother    Cancer Maternal Grandfather        colon   Colon cancer Maternal Grandfather     SOCIAL HISTORY:  reports that she has never smoked. She has never been exposed to tobacco smoke. She has never used smokeless tobacco. She reports that she does not currently use alcohol. She reports that she does not currently use drugs after having used the following drugs: Marijuana.  PERFORMANCE STATUS: The patient's performance status is 1 - Symptomatic but completely ambulatory  PHYSICAL EXAM: Most Recent Vital Signs: There were no vitals taken for this visit. There were no vitals taken for this visit.  General Appearance:    Alert, cooperative, no distress, appears stated age  Head:    Normocephalic, without obvious abnormality, atraumatic  Eyes:    PERRL, conjunctiva/corneas clear, EOM's intact, fundi    benign, both eyes        Throat:   Lips, mucosa, and tongue normal; teeth and gums normal  Neck:   Supple, symmetrical, trachea midline, no adenopathy;    thyroid:  no enlargement/tenderness/nodules; no carotid   bruit or JVD  Back:     Symmetric, no curvature, ROM normal, no CVA tenderness  Lungs:     Clear to auscultation bilaterally, respirations unlabored   Chest Wall:    No tenderness or deformity   Heart:    Regular rate and rhythm, S1 and S2 normal, no murmur, rub   or gallop     Abdomen:     Soft, non-tender, bowel sounds active all four quadrants,    no masses, no organomegaly        Extremities:   Extremities normal, atraumatic, no cyanosis or edema  Pulses:   2+ and symmetric all extremities  Skin:   Skin color, texture, turgor normal, no rashes or lesions  Lymph nodes:   Cervical, supraclavicular, and axillary nodes normal  Neurologic:   CNII-XII intact, normal strength, sensation and reflexes    throughout    LABORATORY DATA:  No results found for this or any previous visit (from the past 48 hour(s)).    RADIOGRAPHY: No results found.     PATHOLOGY: None  ASSESSMENT/PLAN: Ms. Ainhara Burri is a pleasant 22 yo female with history of IDA.  We will get her set up for 2 doses of Feraheme.  Follow-up in 2 months.   All questions were answered. The patient knows to call the clinic with any problems, questions or concerns. We can certainly see the patient much sooner if necessary.   Eileen Stanford, NP

## 2022-12-18 ENCOUNTER — Telehealth: Payer: Self-pay | Admitting: Family

## 2022-12-18 NOTE — Telephone Encounter (Signed)
Left a voicemail for the patient regarding scheduling two doses of IV Iron and a follow up in two months. Left call back number for scheduling

## 2022-12-23 ENCOUNTER — Ambulatory Visit: Payer: Medicaid Other | Admitting: Family Medicine

## 2022-12-24 ENCOUNTER — Ambulatory Visit: Payer: Medicaid Other | Admitting: Obstetrics and Gynecology

## 2022-12-26 ENCOUNTER — Inpatient Hospital Stay: Payer: Medicaid Other

## 2023-01-01 ENCOUNTER — Telehealth: Payer: Self-pay | Admitting: Family

## 2023-01-01 NOTE — Telephone Encounter (Signed)
patient called and stated she no longer wants to recieve treatment at this location. she stated she may call back in the future. nursing notified. infusion cancelled, did not cancel provider follow up.

## 2023-01-02 ENCOUNTER — Inpatient Hospital Stay: Payer: Medicaid Other

## 2023-01-06 ENCOUNTER — Ambulatory Visit: Payer: Medicaid Other | Admitting: Obstetrics and Gynecology

## 2023-01-06 ENCOUNTER — Encounter: Payer: Self-pay | Admitting: Obstetrics and Gynecology

## 2023-01-06 VITALS — BP 155/83 | HR 90 | Ht 61.5 in | Wt 232.0 lb

## 2023-01-06 DIAGNOSIS — R350 Frequency of micturition: Secondary | ICD-10-CM | POA: Diagnosis not present

## 2023-01-06 DIAGNOSIS — K5904 Chronic idiopathic constipation: Secondary | ICD-10-CM

## 2023-01-06 DIAGNOSIS — M6289 Other specified disorders of muscle: Secondary | ICD-10-CM | POA: Diagnosis not present

## 2023-01-06 DIAGNOSIS — N3941 Urge incontinence: Secondary | ICD-10-CM | POA: Diagnosis not present

## 2023-01-06 DIAGNOSIS — N926 Irregular menstruation, unspecified: Secondary | ICD-10-CM

## 2023-01-06 DIAGNOSIS — N94819 Vulvodynia, unspecified: Secondary | ICD-10-CM

## 2023-01-06 LAB — POCT URINALYSIS DIPSTICK
Bilirubin, UA: NEGATIVE
Blood, UA: NEGATIVE
Glucose, UA: NEGATIVE
Ketones, UA: NEGATIVE
Leukocytes, UA: NEGATIVE
Nitrite, UA: NEGATIVE
Protein, UA: NEGATIVE
Spec Grav, UA: >=1.03 — AB (ref 1.010–1.025)
Urobilinogen, UA: 0.2 E.U./dL
pH, UA: 6 (ref 5.0–8.0)

## 2023-01-06 MED ORDER — NONFORMULARY OR COMPOUNDED ITEM: 5 refills | Status: DC

## 2023-01-06 MED ORDER — MIRABEGRON ER 25 MG PO TB24: 25.0000 mg | ORAL_TABLET | Freq: Every day | ORAL | 5 refills | Status: DC

## 2023-01-06 NOTE — Progress Notes (Signed)
[facility] New Patient Evaluation and Consultation  Referring Provider: Brock Bad, MD PCP: Everrett Coombe, DO Date of Service: 01/06/2023  SUBJECTIVE Chief Complaint: New Patient (Initial Visit) (Michele Carney is a 22 y.o. female here for a consult for pelvic pain./)  History of Present Illness: Michele Carney is a 22 y.o. hispanic female seen in consultation at the request of Dr. Clearance Coots for evaluation of Painful intercourse.    Review of records significant for: Has tried pelvic floor PT, Vaginal estrogen cream and EMLA cream for vaginal pain  US Pelvis Complete (06/24/22) Uterus   Measurements: 8.6 x 4.2 x 5.1 cm = volume: 96.8 mL. No fibroids or other mass visualized.   Endometrium   Thickness: 8.8 mm.  No focal abnormality visualized.   Right ovary   Measurements: 2.8 x 1.5 x 3.5 cm = volume: 7.6 mL. Normal appearance/no adnexal mass.   Left ovary   Measurements: 3.2 x 2.2 x 3.2 cm = volume: 11.8 mL. Normal appearance/no adnexal mass.   Other findings   Trace pelvic fluid.   IMPRESSION: 1. No acute process.  Unremarkable exam.    Urinary Symptoms: Leaks urine with cough/ sneeze, laughing, during sex, with movement to the bathroom, with urgency, and while asleep Leaks 1-2 time(s) per days.  Pad UUV:OZDG She is bothered by her UI symptoms.  Day time voids 5-15.  Nocturia: 1-2 times per night to void. Voiding dysfunction: she empties her bladder well.  does not use a catheter to empty bladder.  When urinating, she feels she has no difficulties Drinks: 120oz Water per day, Occasional energy drinks   UTIs: 3 UTI's in the last year.   Denies history of blood in urine, kidney or bladder stones, pyelonephritis, bladder cancer, and kidney cancer  Pelvic Organ Prolapse Symptoms:                  She Denies a feeling of a bulge the vaginal area. She Denies seeing a bulge.   Bowel Symptom: Bowel movements: 1-2 time(s) per  week Stool consistency: hard Straining: yes.  Splinting: yes.  Incomplete evacuation: yes.  She Denies accidental bowel leakage / fecal incontinence Bowel regimen: fiber Last colonoscopy: Date N/a  Sexual Function Sexually active: yes.  Sexual orientation: Straight Pain with sex: Yes, at the vaginal opening, deep in the pelvis  Pelvic Pain Admits to pelvic pain Location: Deep in the pelvis and at vaginal opening Pain occurs: With intercourse and inserting a tampon Prior pain treatment: Pelvic floor PT Improved by: Nothing Worsened by: Sex   Past Medical History:  Past Medical History:  Diagnosis Date   Anxiety    Depression      Past Surgical History:   Past Surgical History:  Procedure Laterality Date   CESAREAN SECTION N/A 02/08/2020   Procedure: CESAREAN SECTION;  Surgeon: Lazaro Arms, MD;  Location: [facility];  Service: Obstetrics;  Laterality: N/A;     Past OB/GYN History: G1 P1 Vaginal deliveries: 0,  Forceps/ Vacuum deliveries: 0, Cesarean section: 1 Menopausal: LMP Patient's last menstrual period was 09/22/2022. Contraception: None. Last pap smear was June 2024.  Any history of abnormal pap smears: no.   Medications: She has a current medication list which includes the following prescription(s): cholecalciferol, magnesium citrate, mirabegron er, and NONFORMULARY OR COMPOUNDED ITEM.   Allergies: Patient is allergic to shellfish allergy.   Social History:  Social History   Tobacco Use   Smoking status: Never  Passive exposure: Never   Smokeless tobacco: Never  Vaping Use   Vaping status: Never Used  Substance Use Topics   Alcohol use: Not Currently    Alcohol/week: 0.0 - 1.0 standard drinks of alcohol   Drug use: Not Currently    Types: Marijuana    Comment: occasionally. last smoked 2 wks ago    Relationship status: long-term partner She lives with her mother.   She is employed at [employer]. Regular exercise: No History of abuse:  No  Family History:   Family History  Problem Relation Age of Onset   Diabetes Maternal Grandmother    Cancer Maternal Grandfather        colon   Colon cancer Maternal Grandfather      Review of Systems: Review of Systems  Constitutional:  Positive for malaise/fatigue. Negative for chills and fever.       +Weight Gain  Respiratory:  Negative for cough, shortness of breath and wheezing.   Cardiovascular:  Positive for palpitations and leg swelling. Negative for chest pain.  Gastrointestinal:  Positive for abdominal pain and blood in stool.  Genitourinary:  Positive for dysuria. Negative for flank pain and hematuria.       +Abnormal periods  Musculoskeletal:  Negative for myalgias.  Neurological:  Negative for dizziness, weakness and headaches.  Endo/Heme/Allergies:  Does not bruise/bleed easily.  Psychiatric/Behavioral:  Positive for depression and suicidal ideas (Has been referred to Psychiatrist). The patient is nervous/anxious.      OBJECTIVE Physical Exam: Vitals:   01/06/23 0930  BP: (!) 155/83  Pulse: 90  Weight: 232 lb (105.2 kg)  Height: 5' 1.5" (1.562 m)    Physical Exam Constitutional:      General: She is not in acute distress.    Appearance: Normal appearance. She is obese. She is not ill-appearing or toxic-appearing.  Pulmonary:     Effort: Pulmonary effort is normal.  Genitourinary:    General: Normal vulva.  Skin:    General: Skin is warm and dry.  Neurological:     Mental Status: She is alert.  Psychiatric:        Mood and Affect: Mood normal.        Behavior: Behavior normal.        Thought Content: Thought content normal.        Judgment: Judgment normal.      GU / Detailed Urogynecologic Evaluation:  Pelvic Exam: Normal external female genitalia; Bartholin's and Skene's glands normal in appearance; urethral meatus normal in appearance, no urethral masses or discharge.   CST: negative  Speculum exam reveals normal vaginal mucosa without  atrophy. Cervix normal appearance. Uterus normal single, nontender. Adnexa normal adnexa.    With apex supported, anterior compartment defect was reduced  Pelvic floor strength III/V  Pelvic floor musculature: Right levator non-tender, Right obturator non-tender, Left levator non-tender, Left obturator non-tender  POP-Q:   POP-Q  -2                                            Aa   -2                                           Ba  -8  C   3                                            Gh  3.5                                            Pb  11                                            tvl   -3                                            Ap  -3                                            Bp  -10.5                                              D      Rectal Exam:  Normal external exam  Post-Void Residual (PVR) by Bladder Scan: In order to evaluate bladder emptying, we discussed obtaining a postvoid residual and she agreed to this procedure.  Procedure: The ultrasound unit was placed on the patient's abdomen in the suprapubic region after the patient had voided. A PVR of 31 ml was obtained by bladder scan.  Laboratory Results: POC Urine:   ASSESSMENT AND PLAN [name] is a 22 y.o. with:  1. Pelvic floor dysfunction in female   2. Vulvodynia   3. Urge incontinence   4. Urinary frequency   5. Chronic idiopathic constipation   6. Abnormal menstrual periods   7. Obesity, Class III, BMI 40-49.9 (morbid obesity) (HCC)    Patient has irritation to the touch on the right side of the fourchette. There is no obvious area of bleeding, palpable area of distinction that would be causing pain either. She has previously done pelvic floor PT. We discussed some of her symptoms may be more related to nerve pain, and she was recently diagnosed as pre-diabetic. Plan to start Vulvadynia compounded vagina cream with Gabepentin, Baclofen,  and Amitriptyline.  We discussed the symptoms of overactive bladder (OAB), which include urinary urgency, urinary frequency, nocturia, with or without urge incontinence.  While we do not know the exact etiology of OAB, several treatment options exist. We discussed management including behavioral therapy (decreasing bladder irritants, urge suppression strategies, timed voids, bladder retraining), physical therapy, medication; for refractory cases posterior tibial nerve stimulation, sacral neuromodulation, and intravesical botulinum toxin injection. For anticholinergic medications, we discussed the potential side effects of anticholinergics including dry eyes, dry mouth, constipation, cognitive impairment and urinary retention. For Beta-3 agonist medication, we discussed the potential side effect of elevated blood pressure which is more likely to occur in individuals with uncontrolled hypertension. Patient currently  has chronic constipation, so would not recommend anticholinergic medications. Will start Myrbetriq 25mg  daily for patient. She will initiate lifestyle changes with drinking only water, reduction in how much water she is drinking, and drinking her fluid intentionally.  We disucssed trying to utilize Power pudding recipe for her bowel movements.  Patient reportedly has had intermittent periods and has been having issues with irregular cycles. We discussed that stress and weight gain can play a factor into this. She is not a fan of traditional birth control. We discussed that Myo Inositol is a supplement that can be helpful in supporting cycle regularity. We also discussed weight loss.  As patient has struggled with weight and diet and these are possible contributors to her OAB symptoms and irregular cycles, we discussed a referral for healthy weight and wellness. She is interested in healing her relationship with food and creating better lifestyle changes for herself and her daughter. She is Hispanic  and reports that culturally there are lots of carb-heavy and acidic foods in her diet which she struggles to eliminate. I also encouraged her to consider [facility] as an option for Talk[facility]The[facility] as she is not interested in medication for her mental health but would like a therapist.   Patient to return in 3 months or sooner if needed.     Selmer Dominion, NP

## 2023-01-06 NOTE — Patient Instructions (Addendum)
Today we talked about ways to manage bladder urgency such as altering your diet to avoid irritative beverages and foods (bladder diet) as well as attempting to decrease stress and other exacerbating factors.  You can also chew a plain Tums 1-3 times per day to make your urine less acidic, especially if you have eating/drinking acidic things.   There is a website with helpful information for people with bladder irritation, called the IC Network at https://www.ic-network.com. This website has more information about a healthy bladder diet and patient forums for support.  The Most Bothersome Foods* The Least Bothersome Foods*  Coffee - Regular & Decaf Tea - caffeinated Carbonated beverages - cola, non-colas, diet & caffeine-free Alcohols - Beer, Red Wine, White Wine, [address] - Grapefruit, [place], Orange, [employer] - Cranberry, Grapefruit, Orange, Pineapple Vegetables - Tomato & Tomato Products Flavor Enhancers - Hot peppers, Spicy foods, Chili, Horseradish, Vinegar, Monosodium glutamate (MSG) Artificial Sweeteners - NutraSweet, Sweet 'N Low, Equal (sweetener), Saccharin Ethnic foods - [place], [place], [place] food [employer] - low-fat & whole Fruits - Bananas, Blueberries, Honeydew melon, Pears, Raisins, Watermelon Vegetables - Broccoli, [address] Sprouts, [place], Carrots, Cauliflower, [place], Cucumber, Mushrooms, Peas, Radishes, Squash, Zucchini, White potatoes, Sweet potatoes & yams Poultry - Chicken, Eggs, [place], [employer] - Beef, Diplomatic Services operational officer, Lamb Seafood - Shrimp, [place] fish, Salmon Grains - Oat, Rice Snacks - Pretzels, Popcorn  *Lenward Chancellor et al. Diet and its role in interstitial cystitis/bladder pain syndrome (IC/BPS) and comorbid conditions. [employer]. BJU Int. 2012 Jan 11.    Constipation: Our goal is to achieve formed bowel movements daily or every-other-day.  You may need to try different combinations of the following options to find what works best for you -  everybody's body works differently so feel free to adjust the dosages as needed.  Some options to help maintain bowel health include:  Dietary changes (more leafy greens, vegetables and fruits; less processed foods) Fiber supplementation (Benefiber, FiberCon, Metamucil or Psyllium). Start slow and increase gradually to full dose. Over-the-counter agents such as: stool softeners (Docusate or Colace) and/or laxatives (Miralax, milk of magnesia)  "Power Pudding" is a natural mixture that may help your constipation.  To make blend 1 cup applesauce, 1 cup wheat bran, and 3/4 cup prune juice, refrigerate and then take 1 tablespoon daily with a large glass of water as needed.  To try and encourage periods you can try Myo-inositol supplement   See if your insurance covers [facility] which is more talk based therapy for anxiety and healing your relationship with food.   I will also send a referral for health weight and wellness.

## 2023-01-08 ENCOUNTER — Other Ambulatory Visit: Payer: Self-pay

## 2023-01-08 ENCOUNTER — Emergency Department (HOSPITAL_COMMUNITY): Payer: Medicaid Other

## 2023-01-08 ENCOUNTER — Emergency Department (HOSPITAL_COMMUNITY)
Admission: EM | Admit: 2023-01-08 | Discharge: 2023-01-09 | Disposition: A | Discharge status: Home / Self Care | Payer: Medicaid Other | Attending: Student | Admitting: Student

## 2023-01-08 DIAGNOSIS — R079 Chest pain, unspecified: Secondary | ICD-10-CM | POA: Diagnosis not present

## 2023-01-08 DIAGNOSIS — R0789 Other chest pain: Secondary | ICD-10-CM

## 2023-01-08 DIAGNOSIS — R110 Nausea: Secondary | ICD-10-CM | POA: Diagnosis not present

## 2023-01-08 DIAGNOSIS — R519 Headache, unspecified: Secondary | ICD-10-CM | POA: Insufficient documentation

## 2023-01-08 LAB — COMPREHENSIVE METABOLIC PANEL
ALT: 17 U/L (ref 0–44)
AST: 21 U/L (ref 15–41)
Albumin: 3.9 g/dL (ref 3.5–5.0)
Alkaline Phosphatase: 76 U/L (ref 38–126)
Anion gap: 11 (ref 5–15)
BUN: 10 mg/dL (ref 6–20)
CO2: 24 mmol/L (ref 22–32)
Calcium: 9.3 mg/dL (ref 8.9–10.3)
Chloride: 103 mmol/L (ref 98–111)
Creatinine, Ser: 0.64 mg/dL (ref 0.44–1.00)
GFR, Estimated: >60 mL/min (ref >60)
Glucose, Bld: 96 mg/dL (ref 70–99)
Potassium: 3.9 mmol/L (ref 3.5–5.1)
Sodium: 138 mmol/L (ref 135–145)
Total Bilirubin: 0.5 mg/dL (ref 0.3–1.2)
Total Protein: 7.5 g/dL (ref 6.5–8.1)

## 2023-01-08 LAB — CBC
HCT: 39.7 % (ref 36.0–46.0)
Hemoglobin: 12.6 g/dL (ref 12.0–15.0)
MCH: 24 pg — ABNORMAL LOW (ref 26.0–34.0)
MCHC: 31.7 g/dL (ref 30.0–36.0)
MCV: 75.5 fL — ABNORMAL LOW (ref 80.0–100.0)
Platelets: 257 10*3/uL (ref 150–400)
RBC: 5.26 MIL/uL — ABNORMAL HIGH (ref 3.87–5.11)
RDW: 15.4 % (ref 11.5–15.5)
WBC: 10.3 10*3/uL (ref 4.0–10.5)
nRBC: 0 % (ref 0.0–0.2)

## 2023-01-08 LAB — TROPONIN I (HIGH SENSITIVITY)
Troponin I (High Sensitivity): 3 ng/L (ref <18)
Troponin I (High Sensitivity): <2 ng/L (ref <18)

## 2023-01-08 LAB — LIPASE, BLOOD: Lipase: 28 U/L (ref 11–51)

## 2023-01-08 LAB — HCG, SERUM, QUALITATIVE: Preg, Serum: NEGATIVE

## 2023-01-08 MED ORDER — PROCHLORPERAZINE EDISYLATE 10 MG/2ML IJ SOLN
10.0000 mg | Freq: Once | INTRAMUSCULAR | Status: AC
  Administered 2023-01-09: 10 mg via INTRAVENOUS
  Filled 2023-01-08: qty 2

## 2023-01-08 MED ORDER — LACTATED RINGERS IV BOLUS
1000.0000 mL | Freq: Once | INTRAVENOUS | Status: AC
  Administered 2023-01-09: 1000 mL via INTRAVENOUS

## 2023-01-08 MED ORDER — KETOROLAC TROMETHAMINE 15 MG/ML IJ SOLN
15.0000 mg | Freq: Once | INTRAMUSCULAR | Status: DC
  Filled 2023-01-08: qty 1

## 2023-01-08 MED ORDER — DIPHENHYDRAMINE HCL 50 MG/ML IJ SOLN
25.0000 mg | Freq: Once | INTRAMUSCULAR | Status: AC
  Administered 2023-01-09: 25 mg via INTRAVENOUS
  Filled 2023-01-08: qty 1

## 2023-01-08 NOTE — ED Provider Triage Note (Signed)
Emergency Medicine Provider Triage Evaluation Note  Michele Carney [facility] , a 22 y.o. female  was evaluated in triage.  Pt complains of with concerns for right-sided chest pain, headache onset prior to arrival.  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  Also notes pain and numbness to the right arm.  Denies abdominal pain, nausea, vomiting.  Review of Systems  Positive:  Negative:   Physical Exam  BP (!) 145/73 (BP Location: Right Arm)   Pulse 90   Temp 98.9 F (37.2 C)   Resp 18   LMP 09/22/2022   SpO2 100%  Gen:   Awake, no distress   Resp:  Normal effort  MSK:   Moves extremities without difficulty  Other:  Negative pronator drift.  No focal neurologic deficits noted on exam.  Able to ambulate without assistance or difficulty.  Tenderness to palpation noted to right upper chest wall, right shoulder, right upper trapezius.  No abdominal tenderness to palpation  Medical Decision Making  Medically screening exam initiated at 8:58 PM.  Appropriate orders placed.  Michele Carney was informed that the remainder of the evaluation will be completed by another provider, this initial triage assessment does not replace that evaluation, and the importance of remaining in the ED until their evaluation is complete.  Workup initiated   Cartrell Bentsen A, PA-C 01/08/23 2142

## 2023-01-08 NOTE — ED Triage Notes (Signed)
Patient reports central chest pain radiating to right shoulder/right upper arm with fingers tingling and nausea onset this evening . She adds mild dizziness. Respirations unlabored.

## 2023-01-09 MED ORDER — KETOROLAC TROMETHAMINE 15 MG/ML IJ SOLN
15.0000 mg | Freq: Once | INTRAMUSCULAR | Status: AC
  Administered 2023-01-09: 15 mg via INTRAVENOUS

## 2023-01-09 NOTE — ED Provider Notes (Signed)
[place] EMERGENCY DEPARTMENT AT [facility] Provider Note  CSN: [id number] Arrival date & time: 01/08/23 2039  Chief Complaint(s) Chest Pain  HPI Michele Carney is a 22 y.o. female with PMH anxiety, depression, iron deficiency anemia who presents emergency department for evaluation of chest pain and headache.  Patient states that she was at work today when she had centralized chest pain rating to the right arm worse with moving her right shoulder.  She also had an associated right-sided headache with no associated numbness, tingling, weakness or other neurologic complaints.  Endorses mild nausea but denies abdominal pain, shortness of breath, fever or other systemic symptoms.   Past Medical History Past Medical History:  Diagnosis Date   Anxiety    Depression    Patient Active Problem List   Diagnosis Date Noted   Hair loss 11/11/2022   Sciatic nerve pain, right 11/11/2022   GAD (generalized anxiety disorder) 11/11/2022   Well adult exam 03/07/2022   Acute maxillary sinusitis 03/07/2022   Chronic bilateral low back pain without sciatica 11/30/2020   Iron deficiency anemia 02/10/2020   Cesarean delivery delivered 02/08/2020   Non-reactive NST (non-stress test) 02/06/2020   Significant discrepancy between uterine size and clinical dates, antepartum, second trimester 11/16/2019   Yeast vaginitis 10/14/2019   GBS (group B streptococcus) UTI complicating pregnancy 08/31/2019   Supervision of normal first pregnancy, antepartum 07/05/2019   MDD (major depressive disorder) 05/25/2019   Chronic migraine without aura without status migrainosus, not intractable 12/30/2017   Home Medication(s) Prior to Admission medications   Medication Sig Start Date End Date Taking? Authorizing Provider  Cholecalciferol 20 MCG (800 UNIT) TABS Take 1 tablet by mouth daily. Patient taking differently: Take 2 tablets by mouth daily. Unsure dose 11/12/22   Charlton Amor, DO  MAGNESIUM  CITRATE PO Take 1 tablet by mouth daily. Unsure dose    [provider]  mirabegron ER (MYRBETRIQ) 25 MG TB24 tablet Take 1 tablet (25 mg total) by mouth daily. 01/06/23   Selmer Dominion, NP  NONFORMULARY OR COMPOUNDED ITEM Amitriptyline 2.5%/ gabapentin 2.5%/ baclofen 2.5% in vaginal cream.  Place 1g daily in vaginal/ vulvar area.  Dispense 60g with 5 refills. 01/06/23   Selmer Dominion, NP                                                                                                                                    Past Surgical History Past Surgical History:  Procedure Laterality Date   CESAREAN SECTION N/A 02/08/2020   Procedure: CESAREAN SECTION;  Surgeon: Lazaro Arms, MD;  Location: [facility] LD ORS;  Service: Obstetrics;  Laterality: N/A;   Family History Family History  Problem Relation Age of Onset   Diabetes Maternal Grandmother    Cancer Maternal Grandfather        colon   Colon cancer Maternal Grandfather     Social History Social  History   Tobacco Use   Smoking status: Never    Passive exposure: Never   Smokeless tobacco: Never  Vaping Use   Vaping status: Never Used  Substance Use Topics   Alcohol use: Not Currently    Alcohol/week: 0.0 - 1.0 standard drinks of alcohol   Drug use: Not Currently    Types: Marijuana    Comment: occasionally. last smoked 2 wks ago   Allergies Shellfish allergy  Review of Systems Review of Systems  Cardiovascular:  Positive for chest pain.  Neurological:  Positive for headaches.    Physical Exam Vital Signs  I have reviewed the triage vital signs BP (!) 145/73 (BP Location: Right Arm)   Pulse 90   Temp 98.9 F (37.2 C)   Resp 18   LMP 09/22/2022   SpO2 100%   Physical Exam Vitals and nursing note reviewed.  Constitutional:      General: She is not in acute distress.    Appearance: She is well-developed.  HENT:     Head: Normocephalic and atraumatic.  Eyes:     Conjunctiva/sclera: Conjunctivae  normal.  Cardiovascular:     Rate and Rhythm: Normal rate and regular rhythm.     Heart sounds: No murmur heard. Pulmonary:     Effort: Pulmonary effort is normal. No respiratory distress.     Breath sounds: Normal breath sounds.  Chest:     Chest wall: Tenderness present.  Abdominal:     Palpations: Abdomen is soft.     Tenderness: There is no abdominal tenderness.  Musculoskeletal:        General: No swelling.     Cervical back: Neck supple.  Skin:    General: Skin is warm and dry.     Capillary Refill: Capillary refill takes less than 2 seconds.  Neurological:     Mental Status: She is alert.  Psychiatric:        Mood and Affect: Mood normal.     ED Results and Treatments Labs (all labs ordered are listed, but only abnormal results are displayed) Labs Reviewed  CBC - Abnormal; Notable for the following components:      Result Value   RBC 5.26 (*)    MCV 75.5 (*)    MCH 24.0 (*)    All other components within normal limits  HCG, SERUM, QUALITATIVE  COMPREHENSIVE METABOLIC PANEL  LIPASE, BLOOD  TROPONIN I (HIGH SENSITIVITY)  TROPONIN I (HIGH SENSITIVITY)                                                                                                                          Radiology CT Head Wo Contrast  Result Date: 01/08/2023 CLINICAL DATA:  Headache, increasing frequency or severity EXAM: CT HEAD WITHOUT CONTRAST TECHNIQUE: Contiguous axial images were obtained from the base of the skull through the vertex without intravenous contrast. RADIATION DOSE REDUCTION: This exam was performed according to the departmental dose-optimization program which includes automated exposure control,  adjustment of the mA and/or kV according to patient size and/or use of iterative reconstruction technique. COMPARISON:  None Available. FINDINGS: Brain: Normal anatomic configuration. No abnormal intra or extra-axial mass lesion or fluid collection. No abnormal mass effect or midline shift.  No evidence of acute intracranial hemorrhage or infarct. Ventricular size is normal. Cerebellum unremarkable. Vascular: Unremarkable Skull: Intact Sinuses/Orbits: Paranasal sinuses are clear. Orbits are unremarkable. Other: Mastoid air cells and middle ear cavities are clear. IMPRESSION: 1. No acute intracranial abnormality. No etiology identified for the patient's headaches. Electronically Signed   By: Helyn Numbers M.D.   On: 01/08/2023 22:13   DG Chest 1 View  Result Date: 01/08/2023 CLINICAL DATA:  Chest pain EXAM: CHEST  1 VIEW COMPARISON:  08/12/2022 FINDINGS: The heart size and mediastinal contours are within normal limits. Both lungs are clear. The visualized skeletal structures are unremarkable. IMPRESSION: No active disease. Electronically Signed   By: Helyn Numbers M.D.   On: 01/08/2023 22:11    Pertinent labs & imaging results that were available during my care of the patient were reviewed by me and considered in my medical decision making (see MDM for details).  Medications Ordered in ED Medications  prochlorperazine (COMPAZINE) injection 10 mg (has no administration in time range)  diphenhydrAMINE (BENADRYL) injection 25 mg (has no administration in time range)  lactated ringers bolus 1,000 mL (has no administration in time range)  ketorolac (TORADOL) 15 MG/ML injection 15 mg (has no administration in time range)                                                                                                                                     Procedures Procedures  (including critical care time)  Medical Decision Making / ED Course   This patient presents to the ED for concern of chest pain, headache, this involves an extensive number of treatment options, and is a complaint that carries with it a high risk of complications and morbidity.  The differential diagnosis includes musculoskeletal chest pain, anxiety, ACS, PE, migraine headache, GERD, pneumonia  MDM: Patient seen  emergency department for evaluation of chest pain and headache.  Physical exam with reproducible chest wall tenderness over the right pectoral muscle into the shoulder.  Neurologic exam unremarkable with no focal motor or sensory deficits.  Laboratory evaluation unremarkable including negative high-sensitivity troponin.  Chest x-ray and CT head unremarkable.  ECG nonischemic.  Patient given headache cocktail and on reevaluation symptoms have completely resolved.  Patient is PERC negative and I have low suspicion for PE.  Heart score is less than 3 and I have low suspicion for ACS.  At this time, with symptoms improved patient does not meet inpatient criteria for admission she is safe for discharge with outpatient follow-up.   Additional history obtained:  -External records from outside source obtained and reviewed including: Chart review including previous notes, labs, imaging, consultation notes  Lab Tests: -I ordered, reviewed, and interpreted labs.   The pertinent results include:   Labs Reviewed  CBC - Abnormal; Notable for the following components:      Result Value   RBC 5.26 (*)    MCV 75.5 (*)    MCH 24.0 (*)    All other components within normal limits  HCG, SERUM, QUALITATIVE  COMPREHENSIVE METABOLIC PANEL  LIPASE, BLOOD  TROPONIN I (HIGH SENSITIVITY)  TROPONIN I (HIGH SENSITIVITY)      EKG   EKG Interpretation Date/Time:  Wednesday January 08 2023 20:47:04 EDT Ventricular Rate:  89 PR Interval:  160 QRS Duration:  84 QT Interval:  358 QTC Calculation: 435 R Axis:   26  Text Interpretation: Normal sinus rhythm No significant change since last tracing Abnormal ECG When compared with ECG of 12-Aug-2022 16:17, PREVIOUS ECG IS PRESENT Confirmed by Jazaria Jarecki ([id number]) on 01/09/2023 12:52:12 AM         Imaging Studies ordered: I ordered imaging studies including chest x-ray, CT head I independently visualized and interpreted imaging. I agree with the radiologist  interpretation   Medicines ordered and prescription drug management: Meds ordered this encounter  Medications   prochlorperazine (COMPAZINE) injection 10 mg   diphenhydrAMINE (BENADRYL) injection 25 mg   lactated ringers bolus 1,000 mL   ketorolac (TORADOL) 15 MG/ML injection 15 mg    -I have reviewed the patients home medicines and have made adjustments as needed  Critical interventions none   Cardiac Monitoring: The patient was maintained on a cardiac monitor.  I personally viewed and interpreted the cardiac monitored which showed an underlying rhythm of: NSR  Social Determinants of Health:  Factors impacting patients care include: none   Reevaluation: After the interventions noted above, I reevaluated the patient and found that they have :improved  Co morbidities that complicate the patient evaluation  Past Medical History:  Diagnosis Date   Anxiety    Depression       Dispostion: I considered admission for this patient, but at this time she does not meet inpatient criteria for admission she is safe for discharge with outpatient follow-up     Final Clinical Impression(s) / ED Diagnoses Final diagnoses:  None     @PCDICTATION @    Glendora Score, MD 01/09/23 [phone]

## 2023-01-10 NOTE — Progress Notes (Signed)
Contact Covermymeds for PA on Mirabegron ER 25 mg. Status: Approved. Covered: 01/10/2023-01/10/2024 [employer] approval code: [id number]

## 2023-02-17 ENCOUNTER — Inpatient Hospital Stay: Payer: Medicaid Other | Admitting: Medical Oncology

## 2023-02-17 ENCOUNTER — Inpatient Hospital Stay: Payer: Medicaid Other | Attending: Family

## 2023-02-17 ENCOUNTER — Encounter: Payer: Self-pay | Admitting: Medical Oncology

## 2023-03-21 ENCOUNTER — Other Ambulatory Visit: Payer: Self-pay | Admitting: Medical Genetics

## 2023-03-21 DIAGNOSIS — Z006 Encounter for examination for normal comparison and control in clinical research program: Secondary | ICD-10-CM

## 2023-03-24 ENCOUNTER — Ambulatory Visit: Payer: Medicaid Other | Admitting: Obstetrics and Gynecology

## 2023-03-27 ENCOUNTER — Other Ambulatory Visit (HOSPITAL_COMMUNITY): Payer: Medicaid Other | Attending: Medical Genetics

## 2023-03-28 ENCOUNTER — Telehealth (INDEPENDENT_AMBULATORY_CARE_PROVIDER_SITE_OTHER): Payer: Medicaid Other | Admitting: Family Medicine

## 2023-03-28 ENCOUNTER — Encounter: Payer: Self-pay | Admitting: Family Medicine

## 2023-03-28 DIAGNOSIS — R200 Anesthesia of skin: Secondary | ICD-10-CM | POA: Insufficient documentation

## 2023-03-28 DIAGNOSIS — M5441 Lumbago with sciatica, right side: Secondary | ICD-10-CM | POA: Diagnosis not present

## 2023-03-28 MED ORDER — PREDNISONE 20 MG PO TABS: 20.0000 mg | ORAL_TABLET | Freq: Two times a day (BID) | ORAL | 0 refills | Status: AC

## 2023-03-28 NOTE — Assessment & Plan Note (Signed)
Xrays of lumbar spine ordered.  Start prednisone burst or 40mg  daily x5 days.  HEP sent via Mychart Red flags and precautions discussed.

## 2023-03-28 NOTE — Assessment & Plan Note (Signed)
Possible CTS.  She will let me know if worsening.  Recommend in office evaluation if not improving.

## 2023-03-28 NOTE — Progress Notes (Signed)
Michele Carney - 22 y.o. female MRN [id number]  Date of birth: [birth year]   This visit type was conducted due to national recommendations for restrictions regarding the COVID-19 Pandemic (e.g. social distancing).  This format is felt to be most appropriate for this patient at this time.  All issues noted in this document were discussed and addressed.  No physical exam was performed (except for noted visual exam findings with Video Visits).  I discussed the limitations of evaluation and management by telemedicine and the availability of in person appointments. The patient expressed understanding and agreed to proceed.  I connected withNAME@ on 03/28/23 at 10:30 AM EST by a video enabled telemedicine application and verified that I am speaking with the correct person using two identifiers.  Present at visit: Everrett Coombe, DO [name]   Patient Location: Home [address] [place] [address]   Provider location:   [facility]  Chief Complaint  Patient presents with   TINGLING IN FINGERS   Leg Pain    HPI  Michele Carney is a 22 y.o. female who presents via audio/video conferencing for a telehealth visit today.  She reports that she has had some pain in her ankle.  Sprained her ankle a few months ago.  Twisted again recently.  Has some pain in the R ankle.  She is also having pain in the lower back now that is radiating into the lower leg.  Pain in the lower back that started about 4 days ago after twisting ankle again.  Massage and heating pad  has helped some.   She is also having sensation of numbness in the R hand and finger tips.  She usually notices this while working.  She denies weakness of the hand or grip strength.  Some intermittent headaches but denies neck pain   ROS:  A comprehensive ROS was completed and negative except as noted per HPI  Past Medical History:  Diagnosis Date   Anxiety    Depression     Past Surgical History:   Procedure Laterality Date   CESAREAN SECTION N/A 02/08/2020   Procedure: CESAREAN SECTION;  Surgeon: Lazaro Arms, MD;  Location: [facility] LD ORS;  Service: Obstetrics;  Laterality: N/A;    Family History  Problem Relation Age of Onset   Diabetes Maternal Grandmother    Cancer Maternal Grandfather        colon   Colon cancer Maternal Grandfather     Social History   Socioeconomic History   Marital status: Significant Other    Spouse name: Not on file   Number of children: Not on file   Years of education: Not on file   Highest education level: High school graduate  Occupational History   Not on file  Tobacco Use   Smoking status: Never    Passive exposure: Never   Smokeless tobacco: Never  Vaping Use   Vaping status: Never Used  Substance and Sexual Activity   Alcohol use: Not Currently    Alcohol/week: 0.0 - 1.0 standard drinks of alcohol   Drug use: Not Currently    Types: Marijuana    Comment: occasionally. last smoked 2 wks ago   Sexual activity: Yes    Birth control/protection: Condom    Comment: on the patch prior  Other Topics Concern   Not on file  Social History Narrative   Not on file   Social Determinants of Health   Financial Resource Strain: Low Risk  (07/05/2019)  Overall Financial Resource Strain (CARDIA)    Difficulty of Paying Living Expenses: Not hard at all  Food Insecurity: No Food Insecurity (12/17/2022)   Hunger Vital Sign    Worried About Running Out of Food in the Last Year: Never true    Ran Out of Food in the Last Year: Never true  Transportation Needs: No Transportation Needs (12/17/2022)   PRAPARE - Administrator, Civil Service (Medical): No    Lack of Transportation (Non-Medical): No  Physical Activity: Not on file  Stress: No Stress Concern Present (07/05/2019)   [employer] of Occupational Health - Occupational Stress Questionnaire    Feeling of Stress : Not at all  Social Connections: Unknown (09/28/2021)    [facility] from [facility], [facility]   Social Network    Social Network: Not on file  Intimate Partner Violence: Not At Risk (12/17/2022)   Humiliation, Afraid, Rape, and Kick questionnaire    Fear of Current or Ex-Partner: No    Emotionally Abused: No    Physically Abused: No    Sexually Abused: No     Current Outpatient Medications:    predniSONE (DELTASONE) 20 MG tablet, Take 1 tablet (20 mg total) by mouth 2 (two) times daily with a meal for 5 days., Disp: 10 tablet, Rfl: 0   Cholecalciferol 20 MCG (800 UNIT) TABS, Take 1 tablet by mouth daily. (Patient taking differently: Take 2 tablets by mouth daily. Unsure dose), Disp: 75 tablet, Rfl: 0   MAGNESIUM CITRATE PO, Take 1 tablet by mouth daily. Unsure dose, Disp: , Rfl:    mirabegron ER (MYRBETRIQ) 25 MG TB24 tablet, Take 1 tablet (25 mg total) by mouth daily., Disp: 30 tablet, Rfl: 5   NONFORMULARY OR COMPOUNDED ITEM, Amitriptyline 2.5%/ gabapentin 2.5%/ baclofen 2.5% in vaginal cream.  Place 1g daily in vaginal/ vulvar area.  Dispense 60g with 5 refills., Disp: 60 each, Rfl: 5  EXAM:  VITALS per patient if applicable: There were no vitals taken for this visit.  GENERAL: alert, oriented, appears well and in no acute distress  HEENT: atraumatic, conjunttiva clear, no obvious abnormalities on inspection of external nose and ears  NECK: normal movements of the head and neck  LUNGS: on inspection no signs of respiratory distress, breathing rate appears normal, no obvious gross SOB, gasping or wheezing  CV: no obvious cyanosis  MS: moves all visible extremities without noticeable abnormality  PSYCH/NEURO: pleasant and cooperative, no obvious depression or anxiety, speech and thought processing grossly intact  ASSESSMENT AND PLAN:  Discussed the following assessment and plan:  Acute right-sided low back pain with right-sided sciatica Xrays of lumbar spine ordered.  Start prednisone burst or 40mg  daily x5 days.  HEP sent  via Mychart Red flags and precautions discussed.   Finger numbness Possible CTS.  She will let me know if worsening.  Recommend in office evaluation if not improving.       I discussed the assessment and treatment plan with the patient. The patient was provided an opportunity to ask questions and all were answered. The patient agreed with the plan and demonstrated an understanding of the instructions.   The patient was advised to call back or seek an in-person evaluation if the symptoms worsen or if the condition fails to improve as anticipated.    Everrett Coombe, DO

## 2023-04-22 ENCOUNTER — Ambulatory Visit: Payer: Medicaid Other

## 2023-04-22 ENCOUNTER — Encounter: Payer: Self-pay | Admitting: Family Medicine

## 2023-04-22 ENCOUNTER — Ambulatory Visit: Payer: Medicaid Other | Admitting: Family Medicine

## 2023-04-22 VITALS — BP 123/75 | HR 101 | Ht 61.5 in | Wt 233.0 lb

## 2023-04-22 DIAGNOSIS — M5412 Radiculopathy, cervical region: Secondary | ICD-10-CM

## 2023-04-22 DIAGNOSIS — M5441 Lumbago with sciatica, right side: Secondary | ICD-10-CM

## 2023-04-22 DIAGNOSIS — R200 Anesthesia of skin: Secondary | ICD-10-CM

## 2023-04-22 DIAGNOSIS — S93401A Sprain of unspecified ligament of right ankle, initial encounter: Secondary | ICD-10-CM

## 2023-04-22 DIAGNOSIS — S93409A Sprain of unspecified ligament of unspecified ankle, initial encounter: Secondary | ICD-10-CM | POA: Insufficient documentation

## 2023-04-22 MED ORDER — GABAPENTIN 300 MG PO CAPS: ORAL_CAPSULE | ORAL | 3 refills | Status: DC

## 2023-04-22 NOTE — Progress Notes (Signed)
Michele Carney - 22 y.o. female MRN [id number]  Date of birth: 09-Aug-2000  Subjective Chief Complaint  Patient presents with   Ankle Injury    HPI Similar complaints at  virtual visit last month.  Prescribed prednisone at that time and sent handout for HEP.  She did not have xrays completed that were ordered of the lumbar spine.  Continues to have pain in her ankle.  Sprained her ankle a few months ago.  Twisted again recently.  Has some pain in the R ankle.  She is also having pain in the lower back now that is radiating into the lower leg.  Pain in the lower back that started  after twisting ankle again.  Massage and heating pad  has helped some.    She is also having sensation of numbness in the R hand and finger tips.  She usually notices this while working.  She does endorse some weakness in the R hand at times.  Pain radiates from her neck down the arm.   ROS:  A comprehensive ROS was completed and negative except as noted per HPI  Allergies  Allergen Reactions   Shellfish Allergy Rash         Past Medical History:  Diagnosis Date   Anxiety    Depression     Past Surgical History:  Procedure Laterality Date   CESAREAN SECTION N/A 02/08/2020   Procedure: CESAREAN SECTION;  Surgeon: Lazaro Arms, MD;  Location: [facility];  Service: Obstetrics;  Laterality: N/A;    Social History   Socioeconomic History   Marital status: Significant Other    Spouse name: Not on file   Number of children: Not on file   Years of education: Not on file   Highest education level: High school graduate  Occupational History   Not on file  Tobacco Use   Smoking status: Never    Passive exposure: Never   Smokeless tobacco: Never  Vaping Use   Vaping status: Never Used  Substance and Sexual Activity   Alcohol use: Not Currently    Alcohol/week: 0.0 - 1.0 standard drinks of alcohol   Drug use: Not Currently    Types: Marijuana    Comment: occasionally. last smoked 2 wks ago    Sexual activity: Yes    Birth control/protection: Condom    Comment: on the patch prior  Other Topics Concern   Not on file  Social History Narrative   Not on file   Social Determinants of Health   Financial Resource Strain: Low Risk  (07/05/2019)   Overall Financial Resource Strain (CARDIA)    Difficulty of Paying Living Expenses: Not hard at all  Food Insecurity: No Food Insecurity (12/17/2022)   Hunger Vital Sign    Worried About Running Out of Food in the Last Year: Never true    Ran Out of Food in the Last Year: Never true  Transportation Needs: No Transportation Needs (12/17/2022)   PRAPARE - Administrator, Civil Service (Medical): No    Lack of Transportation (Non-Medical): No  Physical Activity: Not on file  Stress: No Stress Concern Present (07/05/2019)   [employer] of Occupational Health - Occupational Stress Questionnaire    Feeling of Stress : Not at all  Social Connections: Unknown (09/28/2021)   Received from [facility]   Social Network    Social Network: Not on file    Family History  Problem Relation Age of Onset  Diabetes Maternal Grandmother    Cancer Maternal Grandfather        colon   Colon cancer Maternal Grandfather     Health Maintenance  Topic Date Due   COVID-19 Vaccine (2 - 2023-24 season) 01/19/2023   INFLUENZA VACCINE  08/18/2023 (Originally 12/19/2022)   CHLAMYDIA SCREENING  11/14/2023   Cervical Cancer Screening (Pap smear)  11/13/2025   DTaP/Tdap/Td (9 - Td or Tdap) 11/15/2029   HPV VACCINES  Completed   Hepatitis C Screening  Completed   HIV Screening  Completed     ----------------------------------------------------------------------------------------------------------------------------------------------------------------------------------------------------------------- Physical Exam BP 123/75 (BP Location: Left Arm, Patient Position: Sitting, Cuff Size: Large)   Pulse (!) 101   Ht 5' 1.5"  (1.562 m)   Wt 233 lb (105.7 kg)   SpO2 97%   BMI 43.31 kg/m   Physical Exam Constitutional:      Appearance: Normal appearance.  Eyes:     General: No scleral icterus. Cardiovascular:     Rate and Rhythm: Normal rate and regular rhythm.  Pulmonary:     Effort: Pulmonary effort is normal.     Breath sounds: Normal breath sounds.  Musculoskeletal:     Comments: Negative phalens test.    Neurological:     General: No focal deficit present.     Mental Status: She is alert.  Psychiatric:        Mood and Affect: Mood normal.        Behavior: Behavior normal.     ------------------------------------------------------------------------------------------------------------------------------------------------------------------------------------------------------------------- Assessment and Plan  Acute right-sided low back pain with right-sided sciatica Update xrays. Referral to PT Red flags and precautions discussed.   Finger numbness Xrays of cervical spine ordered.  Adding gabapentin.  Referral to PT  Sprain of ankle Adding PT.    Meds ordered this encounter  Medications   gabapentin (NEURONTIN) 300 MG capsule    Sig: Take 300mg  at bedtime x7 days, then may increase to TID as tolerated.    Dispense:  90 capsule    Refill:  3    No follow-ups on file.    This visit occurred during the SARS-CoV-2 public health emergency.  Safety protocols were in place, including screening questions prior to the visit, additional usage of staff PPE, and extensive cleaning of exam room while observing appropriate contact time as indicated for disinfecting solutions.

## 2023-04-22 NOTE — Assessment & Plan Note (Signed)
Xrays of cervical spine ordered.  Adding gabapentin.  Referral to PT

## 2023-04-22 NOTE — Assessment & Plan Note (Signed)
Update xrays. Referral to PT Red flags and precautions discussed.

## 2023-04-22 NOTE — Assessment & Plan Note (Signed)
Adding PT.

## 2023-05-09 ENCOUNTER — Ambulatory Visit
Admission: EM | Admit: 2023-05-09 | Discharge: 2023-05-09 | Disposition: A | Discharge status: Home / Self Care | Payer: Medicaid Other | Attending: Internal Medicine | Admitting: Internal Medicine

## 2023-05-09 ENCOUNTER — Other Ambulatory Visit: Payer: Self-pay

## 2023-05-09 ENCOUNTER — Ambulatory Visit (INDEPENDENT_AMBULATORY_CARE_PROVIDER_SITE_OTHER): Payer: Medicaid Other

## 2023-05-09 DIAGNOSIS — J209 Acute bronchitis, unspecified: Secondary | ICD-10-CM

## 2023-05-09 DIAGNOSIS — R059 Cough, unspecified: Secondary | ICD-10-CM | POA: Diagnosis not present

## 2023-05-09 MED ORDER — PREDNISONE 20 MG PO TABS
40.0000 mg | ORAL_TABLET | Freq: Every day | ORAL | 0 refills | Status: AC
Start: 2023-05-09 — End: 2023-05-14

## 2023-05-09 NOTE — ED Provider Notes (Signed)
[facility]    CSN: [id number] Arrival date & time: 05/09/23  4098      History   Chief Complaint Chief Complaint  Patient presents with   Chest Pain   Headache   Abdominal Pain   Sore Throat    HPI Michele Carney is a 22 y.o. female.   Patient here today for evaluation of cold, chills, headache that started 6 days ago. She reports she has had some sore throat as well.  She notes some chest pain at times with cough but also at times when she is not coughing.  She reports some chest tightness which seem to occur at work.  She does not report shortness of breath.  She has taken over-the-counter medication without resolution.  She has not had fever.  The history is provided by the patient.  Chest Pain Associated symptoms: cough and headache   Associated symptoms: no fever, no nausea, no shortness of breath and no vomiting   Headache Associated symptoms: congestion, cough and sore throat   Associated symptoms: no diarrhea, no ear pain, no fever, no nausea and no vomiting   Abdominal Pain Associated symptoms: chest pain, cough and sore throat   Associated symptoms: no chills, no diarrhea, no fever, no nausea, no shortness of breath and no vomiting   Sore Throat Associated symptoms include chest pain and headaches. Pertinent negatives include no shortness of breath.    Past Medical History:  Diagnosis Date   Anxiety    Depression     Patient Active Problem List   Diagnosis Date Noted   Sprain of ankle 04/22/2023   Acute right-sided low back pain with right-sided sciatica 03/28/2023   Finger numbness 03/28/2023   Hair loss 11/11/2022   Sciatic nerve pain, right 11/11/2022   GAD (generalized anxiety disorder) 11/11/2022   Well adult exam 03/07/2022   Acute maxillary sinusitis 03/07/2022   Chronic bilateral low back pain without sciatica 11/30/2020   Iron deficiency anemia 02/10/2020   Cesarean delivery delivered 02/08/2020   Non-reactive NST  (non-stress test) 02/06/2020   Significant discrepancy between uterine size and clinical dates, antepartum, second trimester 11/16/2019   GBS (group B streptococcus) UTI complicating pregnancy 08/31/2019   Supervision of normal first pregnancy, antepartum 07/05/2019   MDD (major depressive disorder) 05/25/2019   Chronic migraine without aura without status migrainosus, not intractable 12/30/2017    Past Surgical History:  Procedure Laterality Date   CESAREAN SECTION N/A 02/08/2020   Procedure: CESAREAN SECTION;  Surgeon: Lazaro Arms, MD;  Location: [facility];  Service: Obstetrics;  Laterality: N/A;    OB History     Gravida  1   Para  1   Term  1   Preterm      AB      Living  1      SAB      IAB      Ectopic      Multiple  0   Live Births  1            Home Medications    Prior to Admission medications   Medication Sig Start Date End Date Taking? Authorizing Provider  predniSONE (DELTASONE) 20 MG tablet Take 2 tablets (40 mg total) by mouth daily with breakfast for 5 days. 05/09/23 05/14/23 Yes Tomi Bamberger, PA-C  gabapentin (NEURONTIN) 300 MG capsule Take 300mg  at bedtime x7 days, then may increase to TID as tolerated. 04/22/23   Everrett Coombe, DO  NONFORMULARY OR COMPOUNDED ITEM Amitriptyline 2.5%/ gabapentin 2.5%/ baclofen 2.5% in vaginal cream.  Place 1g daily in vaginal/ vulvar area.  Dispense 60g with 5 refills. 01/06/23   Selmer Dominion, NP    Family History Family History  Problem Relation Age of Onset   Diabetes Maternal Grandmother    Cancer Maternal Grandfather        colon   Colon cancer Maternal Grandfather     Social History Social History   Tobacco Use   Smoking status: Never    Passive exposure: Never   Smokeless tobacco: Never  Vaping Use   Vaping status: Never Used  Substance Use Topics   Alcohol use: Not Currently    Alcohol/week: 0.0 - 1.0 standard drinks of alcohol   Drug use: Not Currently    Types:  Marijuana    Comment: occasionally. last smoked 2 wks ago     Allergies   Shellfish allergy   Review of Systems Review of Systems  Constitutional:  Negative for chills and fever.  HENT:  Positive for congestion and sore throat. Negative for ear pain.   Eyes:  Negative for discharge and redness.  Respiratory:  Positive for cough. Negative for shortness of breath and wheezing.   Cardiovascular:  Positive for chest pain.  Gastrointestinal:  Negative for diarrhea, nausea and vomiting.  Neurological:  Positive for headaches.     Physical Exam Triage Vital Signs ED Triage Vitals  Encounter Vitals Group     BP 05/09/23 0955 100/62     Systolic BP Percentile --      Diastolic BP Percentile --      Pulse Rate 05/09/23 0955 (!) 106     Resp 05/09/23 0955 20     Temp 05/09/23 0955 98.4 F (36.9 C)     Temp Source 05/09/23 0955 Oral     SpO2 05/09/23 0955 96 %     Weight 05/09/23 0954 230 lb (104.3 kg)     Height 05/09/23 0954 5' 1.5" (1.562 m)     Head Circumference --      Peak Flow --      Pain Score 05/09/23 0953 5     Pain Loc --      Pain Education --      Exclude from Growth Chart --    No data found.  Updated Vital Signs BP 100/62 (BP Location: Left Arm)   Pulse (!) 106   Temp 98.4 F (36.9 C) (Oral)   Resp 20   Ht 5' 1.5" (1.562 m)   Wt 230 lb (104.3 kg)   LMP 05/08/2023   SpO2 96%   BMI 42.75 kg/m   Visual Acuity Right Eye Distance:   Left Eye Distance:   Bilateral Distance:    Right Eye Near:   Left Eye Near:    Bilateral Near:     Physical Exam Vitals and nursing note reviewed.  Constitutional:      General: She is not in acute distress.    Appearance: Normal appearance. She is not ill-appearing.  HENT:     Head: Normocephalic and atraumatic.     Nose: Congestion present.     Mouth/Throat:     Mouth: Mucous membranes are moist.     Pharynx: No oropharyngeal exudate or posterior oropharyngeal erythema.  Eyes:     Conjunctiva/sclera:  Conjunctivae normal.  Cardiovascular:     Rate and Rhythm: Normal rate and regular rhythm.     Heart sounds: Normal heart sounds. No  murmur heard. Pulmonary:     Effort: Pulmonary effort is normal. No respiratory distress.     Breath sounds: Normal breath sounds. No wheezing, rhonchi or rales.  Skin:    General: Skin is warm and dry.  Neurological:     Mental Status: She is alert.  Psychiatric:        Mood and Affect: Mood normal.        Thought Content: Thought content normal.      UC Treatments / Results  Labs (all labs ordered are listed, but only abnormal results are displayed) Labs Reviewed - No data to display  EKG   Radiology DG Chest 2 View Result Date: 05/09/2023 CLINICAL DATA:  Cough EXAM: CHEST - 2 VIEW COMPARISON:  None Available. FINDINGS: The heart size and mediastinal contours are within normal limits. No consolidation, pneumothorax or effusion. No edema. The visualized skeletal structures are unremarkable. IMPRESSION: No acute cardiopulmonary disease. Electronically Signed   By: Karen Kays M.D.   On: 05/09/2023 11:47    Procedures Procedures (including critical care time)  Medications Ordered in UC Medications - No data to display  Initial Impression / Assessment and Plan / UC Course  I have reviewed the triage vital signs and the nursing notes.  Pertinent labs & imaging results that were available during my care of the patient were reviewed by me and considered in my medical decision making (see chart for details).    X-ray without abnormal findings.  Suspect likely bronchitis and will treat with steroid burst.  Recommended follow-up if no gradual improvement or with any further concerns.  Final Clinical Impressions(s) / UC Diagnoses   Final diagnoses:  Cough, unspecified type  Acute bronchitis, unspecified organism   Discharge Instructions   None    ED Prescriptions     Medication Sig Dispense Auth. Provider   predniSONE (DELTASONE) 20 MG  tablet Take 2 tablets (40 mg total) by mouth daily with breakfast for 5 days. 10 tablet Tomi Bamberger, PA-C      PDMP not reviewed this encounter.   Tomi Bamberger, PA-C 05/09/23 1259

## 2023-05-09 NOTE — ED Triage Notes (Signed)
"  This started with Cold, chills, ha on Sunday, Early Monday morning throat hurting with ha, continued with congestion, my concerns today is dizziness and chest pain sometimes the pain is related to the Cough and sometimes it isn't". No fever known.

## 2023-06-24 ENCOUNTER — Encounter: Payer: Self-pay | Admitting: Family

## 2023-07-14 ENCOUNTER — Ambulatory Visit (INDEPENDENT_AMBULATORY_CARE_PROVIDER_SITE_OTHER): Payer: Medicaid Other | Admitting: Physician Assistant

## 2023-07-14 ENCOUNTER — Encounter (INDEPENDENT_AMBULATORY_CARE_PROVIDER_SITE_OTHER): Payer: Self-pay | Admitting: Physician Assistant

## 2023-07-14 VITALS — BP 106/71 | HR 92 | Temp 98.4°F | Ht 62.0 in | Wt 226.0 lb

## 2023-07-14 DIAGNOSIS — F411 Generalized anxiety disorder: Secondary | ICD-10-CM

## 2023-07-14 DIAGNOSIS — L659 Nonscarring hair loss, unspecified: Secondary | ICD-10-CM

## 2023-07-14 DIAGNOSIS — F321 Major depressive disorder, single episode, moderate: Secondary | ICD-10-CM

## 2023-07-14 DIAGNOSIS — E559 Vitamin D deficiency, unspecified: Secondary | ICD-10-CM | POA: Diagnosis not present

## 2023-07-14 DIAGNOSIS — D509 Iron deficiency anemia, unspecified: Secondary | ICD-10-CM | POA: Diagnosis not present

## 2023-07-14 DIAGNOSIS — R5383 Other fatigue: Secondary | ICD-10-CM | POA: Diagnosis not present

## 2023-07-14 DIAGNOSIS — N912 Amenorrhea, unspecified: Secondary | ICD-10-CM

## 2023-07-14 DIAGNOSIS — M5431 Sciatica, right side: Secondary | ICD-10-CM

## 2023-07-14 DIAGNOSIS — Z0289 Encounter for other administrative examinations: Secondary | ICD-10-CM

## 2023-07-14 DIAGNOSIS — E66813 Obesity, class 3: Secondary | ICD-10-CM

## 2023-07-14 DIAGNOSIS — Z6841 Body Mass Index (BMI) 40.0 and over, adult: Secondary | ICD-10-CM

## 2023-07-14 NOTE — Progress Notes (Signed)
 Office: [phone]  /  Fax: [phone]   Initial Visit  Michele Carney was seen in clinic today to evaluate for obesity. She is interested in losing weight to improve overall health and reduce the risk of weight related complications. She presents today to review program treatment options, initial physical assessment, and evaluation.     She was referred by: Specialist- GYN  When asked what else they would like to accomplish? She states: Adopt healthier eating patterns, Improve energy levels and physical activity, Improve existing medical conditions, Improve quality of life, Improve self-confidence, and wants to be healthy and adopt better habits and show good example for child.   Weight history: Struggled with weight/body dysmorphia as youth. Gained weight during pregnancy and developed poor eating habits due to in school and working. Quit school as too stressed to parent, work and go to school all at the same time. Works full time at night.    When asked how has your weight affected you? She states: Has affected self-esteem, Having fatigue, Having poor endurance, Problems with eating patterns, and Has affected mood   Some associated conditions: Hyperlipidemia, Prediabetes, GERD, Overactive bladder, Vitamin D Deficiency, and Venous insufficiency  Contributing factors: Family history of obesity, Disruption of circadian rhythm / sleep disordered breathing, Consumption of processed foods, Moderate to high levels of stress, Reduced physical activity, Eating patterns, Mental health problems, Strong orexigenic signaling and inadequate inhibitory control , Slow metabolism for age, and Enticing relationships and enviroment  Weight promoting medications identified: None  Current nutrition plan: None  Current level of physical activity: Yoga 15-30 minutes  Current or previous pharmacotherapy: None  Response to medication: Never tried medications   Past medical history includes:    Past Medical History:  Diagnosis Date   Anxiety    Depression      Objective:   BP 106/71   Pulse 92   Temp 98.4 F (36.9 C)   Ht 5\' 2"  (1.575 m)   Wt 226 lb (102.5 kg)   SpO2 98%   BMI 41.34 kg/m  She was weighed on the bioimpedance scale: Body mass index is 41.34 kg/m.  Peak Weight:250 lbs , Body Fat%:48.9%, Visceral Fat Rating:12, Weight trend over the last 12 months: Increasing  General:  Alert, oriented and cooperative. Patient is in no acute distress.  Respiratory: Normal respiratory effort, no problems with respiration noted   Gait: able to ambulate independently  Mental Status: Normal mood and affect. Normal behavior. Normal judgment and thought content.   DIAGNOSTIC DATA REVIEWED:  BMET    Component Value Date/Time   NA 138 01/08/2023 2112   K 3.9 01/08/2023 2112   CL 103 01/08/2023 2112   CO2 24 01/08/2023 2112   GLUCOSE 96 01/08/2023 2112   BUN 10 01/08/2023 2112   CREATININE 0.64 01/08/2023 2112   CREATININE 0.42 (L) 03/07/2022 0000   CALCIUM 9.3 01/08/2023 2112   GFRNONAA >60 01/08/2023 2112   GFRAA >60 02/09/2020 0532   Lab Results  Component Value Date   HGBA1C 5.3 07/21/2019   No results found for: "INSULIN" CBC    Component Value Date/Time   WBC 10.3 01/08/2023 2112   RBC 5.26 (H) 01/08/2023 2112   HGB 12.6 01/08/2023 2112   HGB 10.7 (L) 11/16/2019 0910   HCT 39.7 01/08/2023 2112   HCT 33.3 (L) 11/16/2019 0910   PLT 257 01/08/2023 2112   PLT 224 11/16/2019 0910   MCV 75.5 (L) 01/08/2023 2112   MCV 81 11/16/2019 0910  MCH 24.0 (L) 01/08/2023 2112   MCHC 31.7 01/08/2023 2112   RDW 15.4 01/08/2023 2112   RDW 14.1 11/16/2019 0910   Iron/TIBC/Ferritin/ %Sat    Component Value Date/Time   IRON 47 11/11/2022 0955   TIBC 386 11/11/2022 0955   FERRITIN 7 (L) 11/11/2022 0955   IRONPCTSAT 12 (L) 11/11/2022 0955   Lipid Panel  No results found for: "CHOL", "TRIG", "HDL", "CHOLHDL", "VLDL", "LDLCALC", "LDLDIRECT" Hepatic Function  Panel     Component Value Date/Time   PROT 7.5 01/08/2023 2112   ALBUMIN 3.9 01/08/2023 2112   AST 21 01/08/2023 2112   ALT 17 01/08/2023 2112   ALKPHOS 76 01/08/2023 2112   BILITOT 0.5 01/08/2023 2112      Component Value Date/Time   TSH 0.964 06/17/2022 1047     Assessment and Plan:   Iron deficiency anemia, unspecified iron deficiency anemia type  Vitamin D deficiency  Fatigue, unspecified type  Sciatic nerve pain, right  Hair loss  Current moderate episode of major depressive disorder without prior episode (HCC)  GAD (generalized anxiety disorder)  Amenorrhea  Class 3 severe obesity due to excess calories without serious comorbidity with body mass index (BMI) of 40.0 to 44.9 in adult [facility])        Obesity Treatment / Action Plan:  Patient will work on garnering support from family and friends to begin weight loss journey. Will work on eliminating or reducing the presence of highly palatable, calorie dense foods in the home. Will complete provided nutritional and psychosocial assessment questionnaire before the next appointment. Will be scheduled for indirect calorimetry to determine resting energy expenditure in a fasting state.  This will allow [place] to create a reduced calorie, high-protein meal plan to promote loss of fat mass while preserving muscle mass. Will think about ideas on how to incorporate physical activity into their daily routine. Will work on reducing intake of added sugars, simple sugars and processed carbs. Will avoid skipping meals which may result in increased hunger signals and overeating at certain times. Will work on managing stress via relaxation methods as this may result in unhealthy eating patterns. Will work on reading labels, making healthier choices and watching portion sizes. Counseled on the health benefits of losing 5%-15% of total body weight. Will work on improving sleep hygiene and trying to obtain at least 7 hours of  sleep. Was counseled on nutritional approaches to weight loss and benefits of reducing processed foods and consuming plant-based foods and high quality protein as part of nutritional weight management. Was counseled on pharmacotherapy and role as an adjunct in weight management.   Obesity Education Performed Today:  She was weighed on the bioimpedance scale and results were discussed and documented in the synopsis.  We discussed obesity as a disease and the importance of a more detailed evaluation of all the factors contributing to the disease.  We discussed the importance of long term lifestyle changes which include nutrition, exercise and behavioral modifications as well as the importance of customizing this to her specific health and social needs.  We discussed the benefits of reaching a healthier weight to alleviate the symptoms of existing conditions and reduce the risks of the biomechanical, metabolic and psychological effects of obesity.  Maury Mahsa Hanser appears to be in the action stage of change and states they are ready to start intensive lifestyle modifications and behavioral modifications.  30 minutes was spent today on this visit including the above counseling, pre-visit chart review, and post-visit documentation.  Reviewed by clinician on day of visit: allergies, medications, problem list, medical history, surgical history, family history, social history, and previous encounter notes pertinent to obesity diagnosis.   Adalene Gulotta,PA-C

## 2023-08-18 ENCOUNTER — Ambulatory Visit (INDEPENDENT_AMBULATORY_CARE_PROVIDER_SITE_OTHER): Payer: Medicaid Other | Admitting: Family Medicine

## 2023-08-28 ENCOUNTER — Encounter (INDEPENDENT_AMBULATORY_CARE_PROVIDER_SITE_OTHER): Payer: Self-pay

## 2023-09-01 ENCOUNTER — Ambulatory Visit (INDEPENDENT_AMBULATORY_CARE_PROVIDER_SITE_OTHER): Payer: Medicaid Other | Admitting: Family Medicine

## 2023-09-15 ENCOUNTER — Ambulatory Visit (INDEPENDENT_AMBULATORY_CARE_PROVIDER_SITE_OTHER): Admitting: Family Medicine

## 2023-09-29 ENCOUNTER — Ambulatory Visit (INDEPENDENT_AMBULATORY_CARE_PROVIDER_SITE_OTHER): Admitting: Family Medicine

## 2023-10-21 ENCOUNTER — Inpatient Hospital Stay (HOSPITAL_COMMUNITY): Payer: Self-pay

## 2023-10-21 ENCOUNTER — Encounter (HOSPITAL_COMMUNITY): Payer: Self-pay | Admitting: *Deleted

## 2023-10-21 ENCOUNTER — Inpatient Hospital Stay (HOSPITAL_COMMUNITY)
Admission: AD | Admit: 2023-10-21 | Discharge: 2023-10-21 | Disposition: A | Discharge status: Home / Self Care | Payer: Self-pay | Attending: Obstetrics and Gynecology | Admitting: Obstetrics and Gynecology

## 2023-10-21 DIAGNOSIS — R102 Pelvic and perineal pain: Secondary | ICD-10-CM | POA: Insufficient documentation

## 2023-10-21 DIAGNOSIS — O020 Blighted ovum and nonhydatidiform mole: Secondary | ICD-10-CM

## 2023-10-21 DIAGNOSIS — O26851 Spotting complicating pregnancy, first trimester: Secondary | ICD-10-CM

## 2023-10-21 DIAGNOSIS — O26891 Other specified pregnancy related conditions, first trimester: Secondary | ICD-10-CM | POA: Insufficient documentation

## 2023-10-21 DIAGNOSIS — R7989 Other specified abnormal findings of blood chemistry: Secondary | ICD-10-CM

## 2023-10-21 DIAGNOSIS — Z3A00 Weeks of gestation of pregnancy not specified: Secondary | ICD-10-CM

## 2023-10-21 DIAGNOSIS — O26899 Other specified pregnancy related conditions, unspecified trimester: Secondary | ICD-10-CM

## 2023-10-21 DIAGNOSIS — Z3A01 Less than 8 weeks gestation of pregnancy: Secondary | ICD-10-CM | POA: Insufficient documentation

## 2023-10-21 LAB — WET PREP, GENITAL
Clue Cells Wet Prep HPF POC: NONE SEEN
Sperm: NONE SEEN
Trich, Wet Prep: NONE SEEN
WBC, Wet Prep HPF POC: <10 (ref <10)
Yeast Wet Prep HPF POC: NONE SEEN

## 2023-10-21 LAB — URINALYSIS, ROUTINE W REFLEX MICROSCOPIC
Bilirubin Urine: NEGATIVE
Glucose, UA: NEGATIVE mg/dL
Hgb urine dipstick: NEGATIVE
Ketones, ur: NEGATIVE mg/dL
Leukocytes,Ua: NEGATIVE
Nitrite: NEGATIVE
Protein, ur: NEGATIVE mg/dL
Specific Gravity, Urine: 1.016 (ref 1.005–1.030)
pH: 6 (ref 5.0–8.0)

## 2023-10-21 LAB — CBC
HCT: 37.2 % (ref 36.0–46.0)
Hemoglobin: 12.3 g/dL (ref 12.0–15.0)
MCH: 25.7 pg — ABNORMAL LOW (ref 26.0–34.0)
MCHC: 33.1 g/dL (ref 30.0–36.0)
MCV: 77.7 fL — ABNORMAL LOW (ref 80.0–100.0)
Platelets: 295 10*3/uL (ref 150–400)
RBC: 4.79 MIL/uL (ref 3.87–5.11)
RDW: 14.9 % (ref 11.5–15.5)
WBC: 12.6 10*3/uL — ABNORMAL HIGH (ref 4.0–10.5)
nRBC: 0 % (ref 0.0–0.2)

## 2023-10-21 LAB — HCG, QUANTITATIVE, PREGNANCY: hCG, Beta Chain, Quant, S: 165 m[IU]/mL — ABNORMAL HIGH (ref <5)

## 2023-10-21 LAB — POCT PREGNANCY, URINE: Preg Test, Ur: POSITIVE — AB

## 2023-10-21 NOTE — MAU Note (Addendum)
 Michele Carney is a 23 y.o. at Unknown here in MAU reporting: Had a positive HPT. [facility] and they where  unable to see anything with u/s. Sent here to make sure she does not have an ectopic pregnancy. Reports some abd pain and cramping occasionally but nothing consistant. Denies any vag bleeding or discharge.   LMP: 4/172025 Onset of complaint: today Pain score: 4 Vitals:   10/21/23 1449  BP: 121/71  Pulse: (!) 108  Resp: 18  Temp: 98.5 F (36.9 C)     FHT: n/a  Lab orders placed from triage: UPT, U/A

## 2023-10-21 NOTE — MAU Provider Note (Signed)
 S [name] is a 23 y.o. G2P1001 patient who presents to MAU today with complaint of had a positive HPT and went to [facility] today and they performed an ultrasound but they were unable to confirm an IUP. Denies any VB or vaginal discharge but has mild abdominal cramping    O BP 121/71   Pulse (!) 108   Temp 98.5 F (36.9 C)   Resp 18   Ht 5\' 2"  (1.575 m)   Wt 105.7 kg   LMP 09/04/2023   BMI 42.62 kg/m  Physical Exam Vitals and nursing note reviewed.  Constitutional:      General: She is not in acute distress.    Appearance: Normal appearance. She is obese. She is not ill-appearing.  HENT:     Head: Normocephalic.     Nose: Nose normal.     Mouth/Throat:     Mouth: Mucous membranes are moist.  Cardiovascular:     Rate and Rhythm: Normal rate.  Pulmonary:     Effort: Pulmonary effort is normal.  Abdominal:     Palpations: Abdomen is soft.  Musculoskeletal:        General: Normal range of motion.     Cervical back: Normal range of motion.  Skin:    General: Skin is warm.  Neurological:     Mental Status: She is alert and oriented to person, place, and time.  Psychiatric:        Mood and Affect: Mood normal.        Behavior: Behavior normal.      MDM  HIGH  Vaginal bleeding/ cramping  in early pregnacy CBC: unremarkable HCG Quant: 165 ABO: A Positive OB Ultrasound: Questionable very early gestational sac with low HCG level ( Plan to repeat HCG in 48 hours)  Vaginal Swabs: Negative UA: Negative   Differential diagnosis considered for 1st trimester vaginal bleeding includes but is not limited to: ectopic pregnancy, complete spontaneous abortion, incomplete abortion, missed abortion, threatened abortion, embryonic/fetal demise, cervical insufficiency, cervical or vaginal disorder    Orders Placed This Encounter  Procedures   Wet prep, genital    Standing Status:   Standing    Number of Occurrences:   1   US  OB LESS THAN 14  WEEKS WITH OB TRANSVAGINAL    Standing Status:   Standing    Number of Occurrences:   1    Symptom/Reason for Exam:   Vaginal bleeding [[id number]]   Urinalysis, Routine w reflex microscopic -Urine, Clean Catch    Standing Status:   Standing    Number of Occurrences:   1    Specimen Source:   Urine, Clean Catch [76]   CBC    Standing Status:   Standing    Number of Occurrences:   1   hCG, quantitative, pregnancy    Standing Status:   Standing    Number of Occurrences:   1   Pregnancy, urine POC    Standing Status:   Standing    Number of Occurrences:   1      Results for orders placed or performed during the hospital encounter of 10/21/23 (from the past 24 hours)  Pregnancy, urine POC     Status: Abnormal   Collection Time: 10/21/23  2:42 PM  Result Value Ref Range   Preg Test, Ur POSITIVE (A) NEGATIVE  Wet prep, genital     Status: None   Collection Time: 10/21/23  2:52 PM  Result Value Ref  Range   Yeast Wet Prep HPF POC NONE SEEN NONE SEEN   Trich, Wet Prep NONE SEEN NONE SEEN   Clue Cells Wet Prep HPF POC NONE SEEN NONE SEEN   WBC, Wet Prep HPF POC <10 <10   Sperm NONE SEEN   Urinalysis, Routine w reflex microscopic -Urine, Clean Catch     Status: None   Collection Time: 10/21/23  2:57 PM  Result Value Ref Range   Color, Urine YELLOW YELLOW   APPearance CLEAR CLEAR   Specific Gravity, Urine 1.016 1.005 - 1.030   pH 6.0 5.0 - 8.0   Glucose, UA NEGATIVE NEGATIVE mg/dL   Hgb urine dipstick NEGATIVE NEGATIVE   Bilirubin Urine NEGATIVE NEGATIVE   Ketones, ur NEGATIVE NEGATIVE mg/dL   Protein, ur NEGATIVE NEGATIVE mg/dL   Nitrite NEGATIVE NEGATIVE   Leukocytes,Ua NEGATIVE NEGATIVE  CBC     Status: Abnormal   Collection Time: 10/21/23  3:32 PM  Result Value Ref Range   WBC 12.6 (H) 4.0 - 10.5 K/uL   RBC 4.79 3.87 - 5.11 MIL/uL   Hemoglobin 12.3 12.0 - 15.0 g/dL   HCT 70.6 23.7 - 62.8 %   MCV 77.7 (L) 80.0 - 100.0 fL   MCH 25.7 (L) 26.0 - 34.0 pg   MCHC 33.1 30.0 - 36.0  g/dL   RDW 31.5 17.6 - 16.0 %   Platelets 295 150 - 400 K/uL   nRBC 0.0 0.0 - 0.2 %  hCG, quantitative, pregnancy     Status: Abnormal   Collection Time: 10/21/23  3:32 PM  Result Value Ref Range   hCG, Beta Chain, Quant, S 165 (H) <5 mIU/mL     I have reviewed the patient chart and performed the physical exam . I have ordered & interpreted the lab results and reviewed and interpreted the ultrasound images Medications ordered as stated below.  A/P as described below.  Counseling and education provided and patient agreeable  with plan as described below. Verbalized understanding.    ASSESSMENT Medical screening exam complete  1. Spotting affecting pregnancy in first trimester (Primary)  2. Cramping affecting pregnancy, antepartum  3. Empty gestational sac with ongoing pregnancy  4. Elevated serum hCG     PLAN  Future Appointments  Date Time Provider Department Center  10/23/2023  1:50 PM [facility]     Discharge from MAU in stable condition  See AVS for full description of educational information and instructions provided to the patient at time of discharge   Warning signs for worsening condition that would warrant emergency follow-up discussed  Patient may return to MAU as needed   Cherlynn Cornfield, NP 10/21/2023 4:32 PM

## 2023-10-22 ENCOUNTER — Other Ambulatory Visit: Payer: Self-pay

## 2023-10-22 ENCOUNTER — Encounter: Payer: Self-pay | Admitting: Family

## 2023-10-22 LAB — GC/CHLAMYDIA PROBE AMP (~~LOC~~) NOT AT ARMC
Chlamydia: NEGATIVE
Comment: NEGATIVE
Comment: NORMAL
Neisseria Gonorrhea: NEGATIVE

## 2023-10-23 ENCOUNTER — Other Ambulatory Visit (INDEPENDENT_AMBULATORY_CARE_PROVIDER_SITE_OTHER): Payer: Self-pay | Admitting: *Deleted

## 2023-10-23 ENCOUNTER — Other Ambulatory Visit: Payer: Self-pay

## 2023-10-23 VITALS — BP 127/72 | HR 103 | Ht 62.0 in | Wt 233.0 lb

## 2023-10-23 DIAGNOSIS — R7989 Other specified abnormal findings of blood chemistry: Secondary | ICD-10-CM

## 2023-10-23 DIAGNOSIS — O26899 Other specified pregnancy related conditions, unspecified trimester: Secondary | ICD-10-CM

## 2023-10-23 DIAGNOSIS — O26851 Spotting complicating pregnancy, first trimester: Secondary | ICD-10-CM

## 2023-10-23 LAB — BETA HCG QUANT (REF LAB): hCG Quant: 340 m[IU]/mL

## 2023-10-23 NOTE — Progress Notes (Signed)
 Pt presents for stat BHCG following MAU visit on 6/3. Lab drawn and pt advised she will be notified of results and next steps in care later today.  She stated that a detailed message can be left on voicemail if she does not answer. She reports having no vaginal bleeding. She also stated that her abdominal cramping and back pain are unchanged since MAU visit.  Pt advised to return to MAU if she develops heavy vaginal bleeding or worsening pain. She voiced understanding of all information and instructions given.   1707  BHCG result (340) reviewed by Dr. Leveque who finds this is an appropriate rise. She recommends repeat US  in 2 weeks. I called pt and informed her of results and recommended next steps in care. Pt voiced understanding and stated that she has not decided if she will continue the pregnancy. She further stated that she is about 75% decided not to maintain the pregnancy. I advised that she should take the time needed to finalize her decision and let us  know if she would like to schedule ultrasound. She agreed and had no questions.

## 2023-10-27 ENCOUNTER — Telehealth: Payer: Self-pay | Admitting: Family Medicine

## 2023-10-27 ENCOUNTER — Ambulatory Visit: Payer: Self-pay | Admitting: Obstetrics and Gynecology

## 2023-10-27 NOTE — Telephone Encounter (Signed)
 Patient said she was told she need an  ultra sound , she said she wasn't explained to why she needed one.

## 2023-10-29 ENCOUNTER — Other Ambulatory Visit: Payer: Self-pay

## 2023-10-29 ENCOUNTER — Telehealth: Payer: Self-pay

## 2023-10-29 ENCOUNTER — Encounter: Payer: Self-pay | Admitting: Family

## 2023-10-29 ENCOUNTER — Telehealth: Payer: Self-pay | Admitting: Family Medicine

## 2023-10-29 DIAGNOSIS — O3680X0 Pregnancy with inconclusive fetal viability, not applicable or unspecified: Secondary | ICD-10-CM

## 2023-10-29 NOTE — Telephone Encounter (Signed)
 Called pt and she did not answer.  Voicemail message left stating that I will send her a Mychart message with response to her question.

## 2023-10-29 NOTE — Telephone Encounter (Signed)
 Called pt to get her scheduled for a viability u/s, pt did not answer, left VM to give us  a call back. I went ahead and made appt for patient.

## 2023-10-29 NOTE — Telephone Encounter (Signed)
 Called Pt to advise of US  appointment scheduled for 11/04/23 @ 1:15p at [facility], no answer, left VM.

## 2023-11-04 ENCOUNTER — Other Ambulatory Visit: Payer: Self-pay

## 2023-11-04 ENCOUNTER — Encounter: Payer: Self-pay | Admitting: Family Medicine

## 2023-11-04 ENCOUNTER — Ambulatory Visit

## 2023-11-04 DIAGNOSIS — Z3491 Encounter for supervision of normal pregnancy, unspecified, first trimester: Secondary | ICD-10-CM | POA: Diagnosis not present

## 2023-11-04 DIAGNOSIS — Z3A01 Less than 8 weeks gestation of pregnancy: Secondary | ICD-10-CM

## 2023-11-04 DIAGNOSIS — O3680X0 Pregnancy with inconclusive fetal viability, not applicable or unspecified: Secondary | ICD-10-CM

## 2023-11-10 ENCOUNTER — Telehealth: Admitting: Physician Assistant

## 2023-11-10 DIAGNOSIS — O219 Vomiting of pregnancy, unspecified: Secondary | ICD-10-CM

## 2023-11-10 DIAGNOSIS — Z3A08 8 weeks gestation of pregnancy: Secondary | ICD-10-CM | POA: Diagnosis not present

## 2023-11-10 MED ORDER — DICLEGIS 10-10 MG PO TBEC
DELAYED_RELEASE_TABLET | ORAL | 0 refills | Status: AC
Start: 2023-11-10 — End: ?

## 2023-11-10 NOTE — Progress Notes (Signed)
 E-Visit for Nausea and Vomiting   We are sorry that you are not feeling well. Here is how we plan to help!  Based on what you have shared with me it looks like you have morning sickness associated with pregnancy.  Vomiting is the forceful emptying of a portion of the stomach's content through the mouth.  Although nausea and vomiting can make you feel miserable, it's important to remember that these are not diseases, but rather symptoms of an underlying illness.  When we treat short term symptoms, we always caution that any symptoms that persist should be fully evaluated in a medical office.  I have prescribed a medication that will help alleviate your symptoms and allow you to stay hydrated:  Diclegis 10-10mg  Take 1 tablet at bedtime for nausea. If symptoms persist on Day 2, then take 2 tablets at bedtime. If symptoms persist on Day 3, take 1 tablet in the morning and 2 tablets at bedtime. If symptoms continue to persist, can take 2 tablets in the morning and 2 tablets at bedtime (max dose); This is safest in 1st trimester of pregnancy.  HOME CARE: Drink clear liquids.  This is very important! Dehydration (the lack of fluid) can lead to a serious complication.  Start off with 1 tablespoon every 5 minutes for 8 hours. You may begin eating bland foods after 8 hours without vomiting.  Start with saltine crackers, white bread, rice, mashed potatoes, applesauce. After 48 hours on a bland diet, you may resume a normal diet. Try to go to sleep.  Sleep often empties the stomach and relieves the need to vomit.  GET HELP RIGHT AWAY IF:  Your symptoms do not improve or worsen within 2 days after treatment. You have a fever for over 3 days. You cannot keep down fluids after trying the medication.  MAKE SURE YOU:  Understand these instructions. Will watch your condition. Will get help right away if you are not doing well or get worse.    Thank you for choosing an e-visit.  Your e-visit answers were  reviewed by a board certified advanced clinical practitioner to complete your personal care plan. Depending upon the condition, your plan could have included both over the counter or prescription medications.  Please review your pharmacy choice. Make sure the pharmacy is open so you can pick up prescription now. If there is a problem, you may contact your provider through [employer] and have the prescription routed to another pharmacy.  Your safety is important to us . If you have drug allergies check your prescription carefully.   For the next 24 hours you can use MyChart to ask questions about today's visit, request a non-urgent call back, or ask for a work or school excuse. You will get an email in the next two days asking about your experience. I hope that your e-visit has been valuable and will speed your recovery.    I have spent 5 minutes in review of e-visit questionnaire, review and updating patient chart, medical decision making and response to patient.   Delon CHRISTELLA Dickinson, PA-C

## 2024-02-11 ENCOUNTER — Inpatient Hospital Stay (HOSPITAL_COMMUNITY)
Admission: AD | Admit: 2024-02-11 | Discharge: 2024-02-12 | Disposition: A | Discharge status: Home / Self Care | Attending: Obstetrics & Gynecology | Admitting: Obstetrics & Gynecology

## 2024-02-11 DIAGNOSIS — Z3A23 23 weeks gestation of pregnancy: Secondary | ICD-10-CM | POA: Insufficient documentation

## 2024-02-11 DIAGNOSIS — Z711 Person with feared health complaint in whom no diagnosis is made: Secondary | ICD-10-CM

## 2024-02-11 DIAGNOSIS — O26893 Other specified pregnancy related conditions, third trimester: Secondary | ICD-10-CM | POA: Insufficient documentation

## 2024-02-11 DIAGNOSIS — R0789 Other chest pain: Secondary | ICD-10-CM | POA: Insufficient documentation

## 2024-02-11 DIAGNOSIS — O26892 Other specified pregnancy related conditions, second trimester: Secondary | ICD-10-CM

## 2024-02-11 DIAGNOSIS — R519 Headache, unspecified: Secondary | ICD-10-CM | POA: Insufficient documentation

## 2024-02-11 NOTE — MAU Note (Addendum)
 MAU Triage Note: Michele Carney is a 23 y.o. at [redacted]w[redacted]d here in MAU reporting: started having HBP at home this past weekend - checked by OB on Monday and labs WNL ([facility] patient). She started having a headache this morning and chest pain this evening after getting home from work. The tightness makes her feel short of breath. She also reports feeling dizzy and seeing black spotters. She also reports that her feet feel swollen, but do not look swollen. No edema on assessment. Has not responded to Tylenol  - last took 500 mg at 1430 and took Flexeril  around 1730. Denies VB or watery LOF. Reports normal amount of fetal movement.  Patient complaint: chest pain and tightness and headaches  Pain Score: 6  Pain Location: Chest Pain Score: 7 Pain Location: Head   Onset of complaint: today LMP: Patient's last menstrual period was 09/04/2023.  Vitals:   02/12/24 0006  BP: (!) 109/57  Pulse: (!) 108  Resp: 18  Temp: 98.6 F (37 C)  SpO2: 100%    FHT:  Fetal Heart Rate Mode: Doppler Baseline Rate (A): 143 bpm Multiple birth?: No Lab orders placed from triage: UA, EKG

## 2024-02-11 NOTE — Progress Notes (Signed)
 Chief Complaint  Patient presents with  . Routine Prenatal Visit    Michele Carney is a 23 y.o. G2P1001 at [redacted]w[redacted]d.  Estimated Date of Delivery: 06/26/24.  HPI: Pt denies contractions, vaginal bleeding, leakage of fluid. Endorses fetal movement.    Patient reports that she is been having headaches since last night.  She reports it feels most like a tension type headache around the top of her head.  She is not sensitive to light.  Headache is bothersome but not very severe.  She took Flexeril  last night and fell asleep and woke up still with headache.  She has no chest pain, vision changes, right upper quadrant abdominal pain.  She does have some upper abdominal cramping.   Past obstetric, gynecologic, medical, surgical, family, and social history reviewed and updated.   ROS: As per HPI, otherwise negative.   Exam: BP 102/61   Wt 103 kg (227 lb)   BMI 42.89 kg/m    Abd- soft, NT, gravid, Fundus-NT Pt is in no acute distress   FHT per U/S   RECENT LABS: Results for orders placed or performed in visit on 02/11/24  POC Glucose and Protein, Urine   Collection Time: 02/11/24  3:55 PM  Result Value Ref Range   Protein, Urine Negative Negative mg/dL   Glucose, Urine Negative Negative mg/dL       A/P:  A/P:  23 y.o. G2P1001 at [redacted]w[redacted]d gestation   **Elevated BP at neurology at 20 weeks, all BP's normal here -  - Urine P/C ratio at 20 weeks 610 mg - Seeing Neurology for headaches, Flexeril  recommended   Patient with ongoing headache since yesterday.  She took Flexeril  per recommendation by neurology and she reports little improvement.  Reports she fell asleep and woke up and still had headache.  Blood pressure here is normal 102/61.  She is without other symptoms of preeclampsia.  Will check labs today stat CBC, CMP, UA and repeat UPC.  We discussed recommendation to present to emergency department for evaluation versus going home and taking additional headache  medication to see if this improves headache.  Patient will consider her options.  If her headache does not improve with additional medication that I strongly recommend she present to the emergency department for evaluation of intractable headache.  I have also placed a referral for maternal-fetal medicine given her persistent headaches and proteinuria.   at this time, based on her normal blood pressure today, I do not think that she has preeclampsia.  However, it does remain on the differential (given proteinuria and elevated BP at Neurology) although she would be extremely early.  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  UTI 12/19/2023: strep mitis oralis, Keflex  QID x 7 days sent.  - TOC 01/20/2024 Negative  Routine OB Flu Shot- ask @24wks  Glucola-  Tdap-  Blood Type- A+ Rhogam- n/a Genetics- LR Panorama 12-19-23/ nw & Screen NEG AFP/ nw GBS-  Pap- 11/14/22 neg Covid Vaccinated- No Community Navigator- [place]   # v28.9 Prenatal Visit      - Labor warnings given and counseled about pregnancy   This document serves as a record  of services personally performed by Lavanda Rumalda Glaze, MD.  It was created on their behalf by Richerd Panda, CMA, a trained medical scribe, and Certified Medical Assistant (CMA). During the course of documenting the history, physical exam and medical decision making, I was functioning as a Stage manager. The creation of this record is the provider's dictation and/or activities during the visit.  Electronically signed by Richerd Panda, CMA 02/11/2024 3:54 PM   I agree the documentation is accurate and complete.  Electronically signed by: Lavanda Rumalda Glaze, MD  02/11/2024 4:14 PM    MDM moderate 1 new undiagnosed problem (headache, proteinuria in pregnancy) with ordering of each unique test (CBC, CMP, UPC)

## 2024-02-11 NOTE — MAU Note (Incomplete)
 MAU Triage Note: Michele Carney is a 23 y.o. at [redacted]w[redacted]d here in MAU reporting: started having HBP at home this past weekend - checked by OB on Monday and labs WNL ([facility] patient). She started having chest pain and a headache this morning. The tightness makes her feel short of breath. She also reports feeling dizzy and seeing black spotters. Has not responded to Tylenol  - last took 500 mg at 1430 and took Flexeril  around 1730. Denies VB or watery LOF. Reports normal amount of movement.  Patient complaint: chest pain and tightness and headaches        Onset of complaint: today LMP: Patient's last menstrual period was 09/04/2023.  There were no vitals filed for this visit.  FHT:    Lab orders placed from triage: UA, EKG

## 2024-02-12 ENCOUNTER — Other Ambulatory Visit: Payer: Self-pay

## 2024-02-12 DIAGNOSIS — R0789 Other chest pain: Secondary | ICD-10-CM | POA: Diagnosis not present

## 2024-02-12 DIAGNOSIS — R519 Headache, unspecified: Secondary | ICD-10-CM | POA: Diagnosis not present

## 2024-02-12 DIAGNOSIS — Z3A23 23 weeks gestation of pregnancy: Secondary | ICD-10-CM | POA: Diagnosis not present

## 2024-02-12 DIAGNOSIS — O26893 Other specified pregnancy related conditions, third trimester: Secondary | ICD-10-CM | POA: Diagnosis not present

## 2024-02-12 DIAGNOSIS — O26892 Other specified pregnancy related conditions, second trimester: Secondary | ICD-10-CM

## 2024-02-12 LAB — URINALYSIS, ROUTINE W REFLEX MICROSCOPIC
Bilirubin Urine: NEGATIVE
Glucose, UA: NEGATIVE mg/dL
Hgb urine dipstick: NEGATIVE
Ketones, ur: NEGATIVE mg/dL
Leukocytes,Ua: NEGATIVE
Nitrite: NEGATIVE
Protein, ur: NEGATIVE mg/dL
Specific Gravity, Urine: 1.004 — ABNORMAL LOW (ref 1.005–1.030)
pH: 6 (ref 5.0–8.0)

## 2024-02-12 MED ORDER — ACETAMINOPHEN-CAFFEINE 500-65 MG PO TABS
2.0000 | ORAL_TABLET | Freq: Once | ORAL | Status: AC
  Administered 2024-02-12: 2 via ORAL
  Filled 2024-02-12: qty 2

## 2024-02-12 NOTE — MAU Provider Note (Signed)
 History     CSN: [id number]  Arrival date and time: 02/11/24 2351   Event Date/Time   First Provider Initiated Contact with Patient 02/12/24 0124      Chief Complaint  Patient presents with   Headache   Chest Pain   floaters in vision   Michele Carney , a  23 y.o. G2P1001 at [redacted]w[redacted]d presents to MAU with complaints of an on-going headache since last night. Patient states that she went to bed with a headache and then woke up with a headache this morning. Patient states that it has not gone away throughout the day despite tylenol  and flexeril  last dosing was last night @ 1800. Patient has a long standing history of headaches prior to pregnancy that she is seeing a neurologist for. She currently rates her headache as a 7/10 and reports that its located in the top part of her head. She denies visual disturbances and epigastric pain.   Also complains of chest pain. Patient states that after her shower her chest felt tight. Reports worsened with standing and it becomes more sharp. She states that has not tried anything for the pain. Denies cold and flu like symptoms and denies a history of asthma. She has no other OB complaints. Endorses positive fetal movement.   In discussion patient reports knowing a friend with PreE and is just worried for a similar experience for her own pregnancy.         OB History     Gravida  2   Para  1   Term  1   Preterm      AB      Living  1      SAB      IAB      Ectopic      Multiple  0   Live Births  1           Past Medical History:  Diagnosis Date   Anxiety    Depression     Past Surgical History:  Procedure Laterality Date   CESAREAN SECTION N/A 02/08/2020   Procedure: CESAREAN SECTION;  Surgeon: Jayne Vonn DEL, MD;  Location: [facility] LD ORS;  Service: Obstetrics;  Laterality: N/A;    Family History  Problem Relation Age of Onset   Diabetes Maternal Grandmother    Cancer Maternal Grandfather        colon    Colon cancer Maternal Grandfather     Social History   Tobacco Use   Smoking status: Never    Passive exposure: Never   Smokeless tobacco: Never  Vaping Use   Vaping status: Never Used  Substance Use Topics   Alcohol use: Not Currently    Alcohol/week: 0.0 - 1.0 standard drinks of alcohol   Drug use: Not Currently    Types: Marijuana    Comment: occasionally. last smoked 2 wks ago    Allergies:  Allergies  Allergen Reactions   Shellfish Allergy Rash         Medications Prior to Admission  Medication Sig Dispense Refill Last Dose/Taking   DICLEGIS  10-10 MG TBEC Diclegis  brand only; Take 1 tablet at bedtime for nausea. If symptoms persist on Day 2, then take 2 tablets at bedtime. If symptoms persist on Day 3, take 1 tablet in the morning and 2 tablets at bedtime. If symptoms continue to persist, can take 2 tablets in the morning and 2 tablets at bedtime (max dose) 60 tablet 0    Prenatal MV &  Min w/FA-DHA (PRENATAL GUMMIES) 0.18-25 MG CHEW Chew 2 tablets by mouth daily.       Review of Systems  Constitutional:  Negative for chills, fatigue and fever.  Eyes:  Negative for pain and visual disturbance.  Respiratory:  Positive for chest tightness and shortness of breath. Negative for apnea and wheezing.   Cardiovascular:  Negative for chest pain and palpitations.  Gastrointestinal:  Negative for abdominal pain, constipation, diarrhea, nausea and vomiting.  Genitourinary:  Negative for difficulty urinating, dysuria, pelvic pain, vaginal bleeding, vaginal discharge and vaginal pain.  Musculoskeletal:  Negative for back pain and joint swelling.  Neurological:  Positive for dizziness, light-headedness and headaches. Negative for seizures, weakness and numbness.  Psychiatric/Behavioral:  Negative for suicidal ideas.    Physical Exam   Blood pressure 117/61, pulse 95, temperature 98.6 F (37 C), temperature source Oral, resp. rate 18, height 5' 2 (1.575 m), weight 104.8 kg, last  menstrual period 09/04/2023, SpO2 100%.  Physical Exam Vitals and nursing note reviewed.  Constitutional:      General: She is not in acute distress.    Appearance: Normal appearance.  HENT:     Head: Normocephalic.  Cardiovascular:     Rate and Rhythm: Normal rate and regular rhythm.     Heart sounds: Normal heart sounds.  Pulmonary:     Effort: Pulmonary effort is normal.     Breath sounds: Normal breath sounds.  Abdominal:     Palpations: Abdomen is soft.     Tenderness: There is no abdominal tenderness.  Musculoskeletal:        General: No swelling.     Cervical back: Normal range of motion.  Skin:    General: Skin is warm and dry.  Neurological:     Mental Status: She is alert and oriented to person, place, and time.     GCS: GCS eye subscore is 4. GCS verbal subscore is 5. GCS motor subscore is 6.     Cranial Nerves: No cranial nerve deficit.     Gait: Gait normal.     Deep Tendon Reflexes: Reflexes normal.  Psychiatric:        Mood and Affect: Mood normal.    FHT: 140 bpm. Appropriate for gestational age  Toco: Quiet   MAU Course  Procedures Orders Placed This Encounter  Procedures   Urinalysis, Routine w reflex microscopic -Urine, Clean Catch   ED EKG   Meds ordered this encounter  Medications   acetaminophen -caffeine  (EXCEDRIN TENSION HEADACHE) 500-65 MG per tablet 2 tablet   Results for orders placed or performed during the hospital encounter of 02/11/24 (from the past 24 hours)  Urinalysis, Routine w reflex microscopic -Urine, Clean Catch     Status: Abnormal   Collection Time: 02/12/24 12:05 AM  Result Value Ref Range   Color, Urine STRAW (A) YELLOW   APPearance CLEAR CLEAR   Specific Gravity, Urine 1.004 (L) 1.005 - 1.030   pH 6.0 5.0 - 8.0   Glucose, UA NEGATIVE NEGATIVE mg/dL   Hgb urine dipstick NEGATIVE NEGATIVE   Bilirubin Urine NEGATIVE NEGATIVE   Ketones, ur NEGATIVE NEGATIVE mg/dL   Protein, ur NEGATIVE NEGATIVE mg/dL   Nitrite NEGATIVE  NEGATIVE   Leukocytes,Ua NEGATIVE NEGATIVE    MDM - CNM reviewed outside labs as they were completed recently.  - Baseline pre E labs reviewed and low suspicion for PreE. PCR too low to calculate, CMP normal for pregnancy, and elevated WBC but can also be a normal finding in  pregnancy.  - UA negative for protein in MAU  - EKG normal sinus rhythm  - BPs low to normal in MAU  - PO Excedrin ordered in MAU.  - Headache improved to a 4/10.  - Chest pain now a 0/10. - plan for discharge   Assessment and Plan   1. Pregnancy headache in second trimester   2. [redacted] weeks gestation of pregnancy   3. Chest discomfort   4. Physically well but worried    - Reviewed headaches as a normal discomfort of pregnancy.  - Reviewed safe medications OTC that may help with headaches.  - Reviewed worsening signs and return precautions.  - FHT appropriate for gestational age at time of discharge.  - Patient discharged home in stable condition and may return to MAU as needed.   Claris CHRISTELLA Cedar, MSN CNM  02/12/2024, 1:24 AM

## 2024-03-04 ENCOUNTER — Other Ambulatory Visit: Payer: Self-pay | Admitting: Medical Genetics

## 2024-03-04 DIAGNOSIS — Z006 Encounter for examination for normal comparison and control in clinical research program: Secondary | ICD-10-CM

## 2024-03-09 ENCOUNTER — Emergency Department (HOSPITAL_BASED_OUTPATIENT_CLINIC_OR_DEPARTMENT_OTHER)

## 2024-03-09 ENCOUNTER — Inpatient Hospital Stay (HOSPITAL_BASED_OUTPATIENT_CLINIC_OR_DEPARTMENT_OTHER)
Admission: EM | Admit: 2024-03-09 | Discharge: 2024-03-09 | Disposition: A | Discharge status: Home / Self Care | Attending: Obstetrics & Gynecology | Admitting: Obstetrics & Gynecology

## 2024-03-09 ENCOUNTER — Other Ambulatory Visit: Payer: Self-pay

## 2024-03-09 ENCOUNTER — Encounter (HOSPITAL_COMMUNITY): Payer: Self-pay | Admitting: Obstetrics & Gynecology

## 2024-03-09 DIAGNOSIS — Z743 Need for continuous supervision: Secondary | ICD-10-CM | POA: Diagnosis not present

## 2024-03-09 DIAGNOSIS — R609 Edema, unspecified: Secondary | ICD-10-CM | POA: Diagnosis not present

## 2024-03-09 DIAGNOSIS — R0789 Other chest pain: Secondary | ICD-10-CM

## 2024-03-09 DIAGNOSIS — R079 Chest pain, unspecified: Secondary | ICD-10-CM | POA: Diagnosis not present

## 2024-03-09 DIAGNOSIS — Z3A24 24 weeks gestation of pregnancy: Secondary | ICD-10-CM

## 2024-03-09 DIAGNOSIS — O26892 Other specified pregnancy related conditions, second trimester: Secondary | ICD-10-CM | POA: Diagnosis present

## 2024-03-09 DIAGNOSIS — R1084 Generalized abdominal pain: Secondary | ICD-10-CM | POA: Diagnosis not present

## 2024-03-09 DIAGNOSIS — Z3492 Encounter for supervision of normal pregnancy, unspecified, second trimester: Secondary | ICD-10-CM

## 2024-03-09 LAB — WET PREP, GENITAL
Clue Cells Wet Prep HPF POC: NONE SEEN
Sperm: NONE SEEN
Trich, Wet Prep: NONE SEEN
WBC, Wet Prep HPF POC: <10 (ref <10)
Yeast Wet Prep HPF POC: NONE SEEN

## 2024-03-09 LAB — CBC
HCT: 31.6 % — ABNORMAL LOW (ref 36.0–46.0)
Hemoglobin: 10.6 g/dL — ABNORMAL LOW (ref 12.0–15.0)
MCH: 26.5 pg (ref 26.0–34.0)
MCHC: 33.5 g/dL (ref 30.0–36.0)
MCV: 79 fL — ABNORMAL LOW (ref 80.0–100.0)
Platelets: 221 K/uL (ref 150–400)
RBC: 4 MIL/uL (ref 3.87–5.11)
RDW: 15.1 % (ref 11.5–15.5)
WBC: 10.7 K/uL — ABNORMAL HIGH (ref 4.0–10.5)
nRBC: 0 % (ref 0.0–0.2)

## 2024-03-09 LAB — BASIC METABOLIC PANEL WITH GFR
Anion gap: 12 (ref 5–15)
BUN: 6 mg/dL (ref 6–20)
CO2: 23 mmol/L (ref 22–32)
Calcium: 9.2 mg/dL (ref 8.9–10.3)
Chloride: 101 mmol/L (ref 98–111)
Creatinine, Ser: 0.38 mg/dL — ABNORMAL LOW (ref 0.44–1.00)
GFR, Estimated: >60 mL/min (ref >60)
Glucose, Bld: 92 mg/dL (ref 70–99)
Potassium: 3.6 mmol/L (ref 3.5–5.1)
Sodium: 136 mmol/L (ref 135–145)

## 2024-03-09 LAB — TROPONIN T, HIGH SENSITIVITY: Troponin T High Sensitivity: <15 ng/L (ref 0–19)

## 2024-03-09 NOTE — ED Notes (Signed)
 Report given to the MAU RN.[email or web address]

## 2024-03-09 NOTE — ED Notes (Signed)
 Called [facility] to transport the patient to [facility] MAU--Dr. Jennifer Ozan is accepting

## 2024-03-09 NOTE — MAU Note (Signed)
 Michele Carney is a 23 y.o. at [redacted]w[redacted]d here in MAU reporting: transfer from [facility] ED. Presented to ED today with complaints of chest pain/tightness, right arm numbness, swelling in feet, headache, lower abdominal cramping, and decreased fetal movement. States all earlier complaints have resolved other than chest pain/tightness and headache.  LMP: 09/04/2023 Onset of complaint: 03/08/2024 PM Pain score: 5/10 Chest Pain/Pressure; 7/10 Headache Vitals:   03/09/24 1613 03/09/24 1615  BP: (!) 105/56 114/60  Pulse: 86 84  Resp:    Temp:    SpO2:  98%     FHT: 139bpm

## 2024-03-09 NOTE — Telephone Encounter (Signed)
 Call returned, no answer, lmtcb.

## 2024-03-09 NOTE — Telephone Encounter (Signed)
 Call returned, no answer, call could not be completed at this time.   Pt called again to follow up, her name and dob were verified. Michele Carney is currently [redacted]w[redacted]d and reports that she walked around a lot at the fair over the weekend. Since then she has noticed an increase swelling in her hands and feet since with a headache. She reports that her headache has lingered overnight despite taking meds. However, she is also concerned as this am she began to have chest pains located under her left breast radiating under her other breast as well.   At the time of our triage call, her speech is unlabored and clear. [name] reports that her headache is slowly starting to improve, but is still there. She did take her bp on a home cuff last night and it was 120/60 and this am it was 118/62. She denies any additional ob related concerns at this time. She is still having chest pain, her headache has improved some, but is still there. The swelling to her lower extremities has improved a little bit.  [name] was advised to go to the ED for evaluation due to her sustained complaints of chest pain and headache accompanied by her acute onset of swelling to her lower extremities. She has verbalized understanding and has asked if she go to [facility] or [facility] as [facility] is the closest to her job right now. She was advised that due to her complaint of chest pain, it would be better if she went to her nearest emergency department for evaluation to prevent any delays in care. She has verbalized understanding.

## 2024-03-09 NOTE — ED Provider Notes (Signed)
  Physical Exam  BP 129/77 (BP Location: Right Arm)   Pulse 99   Temp 98.7 F (37.1 C) (Oral)   Resp 20   LMP 09/04/2023   SpO2 97%   Physical Exam Vitals and nursing note reviewed.  Constitutional:      General: She is not in acute distress.    Appearance: She is well-developed.  HENT:     Head: Normocephalic and atraumatic.  Eyes:     Conjunctiva/sclera: Conjunctivae normal.  Cardiovascular:     Rate and Rhythm: Normal rate and regular rhythm.     Heart sounds: No murmur heard. Pulmonary:     Effort: Pulmonary effort is normal. No respiratory distress.     Breath sounds: Normal breath sounds.  Abdominal:     Palpations: Abdomen is soft.     Tenderness: There is no abdominal tenderness.  Musculoskeletal:        General: No swelling.     Cervical back: Neck supple.  Skin:    General: Skin is warm and dry.     Capillary Refill: Capillary refill takes less than 2 seconds.  Neurological:     Mental Status: She is alert.  Psychiatric:        Mood and Affect: Mood normal.     Procedures  Procedures  ED Course / MDM    Medical Decision Making Amount and/or Complexity of Data Reviewed Labs: ordered. Radiology: ordered.     Received patient in signout.  Chest pain.  No indication for repeat troponin given symptoms in comparison for greater than 24 hours.  Chest x-ray negative.  No current concerns.  PE.    Transfer team here to take patient over to L&D at [facility] stable   Simon Lavonia SAILOR, MD 03/09/24 1520

## 2024-03-09 NOTE — Telephone Encounter (Signed)
 Patient returning call. Req CB

## 2024-03-09 NOTE — ED Triage Notes (Signed)
 Reports CP that rads into left arm with headache x 1 days. [redacted] weeks pregnant. Mild abd cramping. Reports decreased fetal movement.  Increased thick white discharge. Denies bleeding.  + protein in urine.

## 2024-03-09 NOTE — MAU Provider Note (Signed)
 History     CSN: [id number]  Arrival date and time: 03/09/24 1325   Event Date/Time   First Provider Initiated Contact with Patient 03/09/24 1736      Chief Complaint  Patient presents with   Chest Pain   Michele Carney , a  23 y.o. G2P1001 at [redacted]w[redacted]d presents to MAU after being seen in the Main ED for chest pain and right sided numbness. She was cleared from them and sent to MAU with complaints of decreased fetal movement. Patient states that she was concerned earlier because she was experiencing DFM. Reports that was starting to feel movement in ED and has definitely noticed an increase in movement since arrival to MAU. Patient denies vaginal bleeding, leaking of fluid and contractions. She has no other OB complaints.          OB History     Gravida  2   Para  1   Term  1   Preterm      AB      Living  1      SAB      IAB      Ectopic      Multiple  0   Live Births  1           Past Medical History:  Diagnosis Date   Anxiety    Depression     Past Surgical History:  Procedure Laterality Date   CESAREAN SECTION N/A 02/08/2020   Procedure: CESAREAN SECTION;  Surgeon: Jayne Vonn DEL, MD;  Location: [facility] LD ORS;  Service: Obstetrics;  Laterality: N/A;    Family History  Problem Relation Age of Onset   Diabetes Maternal Grandmother    Cancer Maternal Grandfather        colon   Colon cancer Maternal Grandfather     Social History   Tobacco Use   Smoking status: Never    Passive exposure: Never   Smokeless tobacco: Never  Vaping Use   Vaping status: Never Used  Substance Use Topics   Alcohol use: Not Currently    Alcohol/week: 0.0 - 1.0 standard drinks of alcohol   Drug use: Not Currently    Types: Marijuana    Comment: occasionally. last smoked 2 wks ago    Allergies:  Allergies  Allergen Reactions   Shellfish Allergy Rash         Medications Prior to Admission  Medication Sig Dispense Refill Last Dose/Taking    acetaminophen  (TYLENOL ) 325 MG tablet Take 650 mg by mouth every 6 (six) hours as needed.   03/09/2024   cyclobenzaprine  (FLEXERIL ) 5 MG tablet Take 5 mg by mouth 3 (three) times daily as needed for muscle spasms.   03/08/2024   ferrous sulfate  325 (65 FE) MG tablet Take 325 mg by mouth daily with breakfast.   03/09/2024   magnesium oxide (MAG-OX) 400 (240 Mg) MG tablet Take 400 mg by mouth daily.   03/09/2024   Prenatal MV & Min w/FA-DHA (PRENATAL GUMMIES) 0.18-25 MG CHEW Chew 2 tablets by mouth daily.   03/09/2024   DICLEGIS  10-10 MG TBEC Diclegis  brand only; Take 1 tablet at bedtime for nausea. If symptoms persist on Day 2, then take 2 tablets at bedtime. If symptoms persist on Day 3, take 1 tablet in the morning and 2 tablets at bedtime. If symptoms continue to persist, can take 2 tablets in the morning and 2 tablets at bedtime (max dose) (Patient not taking: Reported on 03/09/2024) 60 tablet 0  Not Taking    Review of Systems  Constitutional:  Negative for chills, fatigue and fever.  Eyes:  Negative for pain and visual disturbance.  Respiratory:  Negative for apnea, shortness of breath and wheezing.   Cardiovascular:  Negative for chest pain and palpitations.  Gastrointestinal:  Negative for abdominal pain, constipation, diarrhea, nausea and vomiting.  Genitourinary:  Negative for difficulty urinating, dysuria, pelvic pain, vaginal bleeding, vaginal discharge and vaginal pain.  Musculoskeletal:  Negative for back pain.  Neurological:  Negative for seizures, weakness and headaches.  Psychiatric/Behavioral:  Negative for suicidal ideas.    Physical Exam   Blood pressure 114/60, pulse 84, temperature 98.7 F (37.1 C), temperature source Oral, resp. rate 20, last menstrual period 09/04/2023, SpO2 99%.  Physical Exam Vitals and nursing note reviewed.  Constitutional:      General: She is not in acute distress.    Appearance: Normal appearance.  HENT:     Head: Normocephalic.  Pulmonary:      Effort: Pulmonary effort is normal.  Musculoskeletal:     Cervical back: Normal range of motion.  Skin:    General: Skin is warm and dry.  Neurological:     Mental Status: She is alert and oriented to person, place, and time.  Psychiatric:        Mood and Affect: Mood normal.    FHT: 140 bpm with moderate variability occasional variable but appropriate for gestational age.  Toco: quiet   MAU Course  Procedures  Patient provided a fetal kick count clicker in MAU.   MDM - Was evaluated and cleared for chest pain in Main ED.  - Patient hit fetal clicker >10 times in MAU.  - FHT appropriate for gestational age  - plan to discharge   Assessment and Plan   1. Chest pressure   2. Movement of fetus present during pregnancy in second trimester   3. [redacted] weeks gestation of pregnancy    - Fetal movement present in MAU. Reviewed 2nd trimester expectations for fetal movement.  - Reviewed worsening signs and return precautions.  - Patient discharged home in stable condition and may return to MAU as needed.   Michele CHRISTELLA Cedar, MSN CNM  03/09/2024, 5:36 PM

## 2024-03-09 NOTE — Progress Notes (Signed)
 G2P1 at 26 5/7 weeks reports to [facility] with c/o chest pain and pressure.  Some abdominal pain.  Sees [facility] for [facility].   Dr JINNY Prude updated on patient status.  Patient needs to transfer to MAU for further workup.  NST currently reassuring for [redacted] week gestation.  Clotilda Beath RNC-OB RROB [phone]

## 2024-03-09 NOTE — ED Provider Notes (Signed)
 [place] EMERGENCY DEPARTMENT AT [facility] Provider Note   CSN: [id number] Arrival date & time: 03/09/24  1325     Patient presents with: Chest Pain   Michele Carney is a 23 y.o. female.   23 year old female who is 24 weeks and 5 days by first trimester ultrasound presenting to the emergency department today with chest pressure.  The patient states that this has been going on now for the past couple of days.  The patient states that she has had some mild abdominal cramping with this as well as some decreased fetal movements.  Reports that she is having some white discharge over the past few days which is abnormal for her.  She denies any vaginal bleeding or leakage of fluids.  She denies any fevers, chills, or cough.  She states that the chest discomfort is more of a pressure sensation.  She denies a history of DVT or pulmonary embolism, recent surgeries, recent travel.  She denies any pleuritic pain.  She also reports having a dull, throbbing headache.  She states that during this pregnancy that she has been having recurrent headaches and this is nothing new.  She states that she took Tylenol  this morning and her headache has all that resolved.  She called her OB/GYN who told her to go to the closest ER for further evaluation.   Chest Pain      Prior to Admission medications   Medication Sig Start Date End Date Taking? Authorizing Provider  DICLEGIS  10-10 MG TBEC Diclegis  brand only; Take 1 tablet at bedtime for nausea. If symptoms persist on Day 2, then take 2 tablets at bedtime. If symptoms persist on Day 3, take 1 tablet in the morning and 2 tablets at bedtime. If symptoms continue to persist, can take 2 tablets in the morning and 2 tablets at bedtime (max dose) 11/10/23   Vivienne Delon HERO, PA-C  Prenatal MV & Min w/FA-DHA (PRENATAL GUMMIES) 0.18-25 MG CHEW Chew 2 tablets by mouth daily.    [provider]    Allergies: Shellfish allergy    Review  of Systems  Cardiovascular:  Positive for chest pain.  All other systems reviewed and are negative.   Updated Vital Signs BP 129/77 (BP Location: Right Arm)   Pulse 99   Temp 98.7 F (37.1 C) (Oral)   Resp 20   LMP 09/04/2023   SpO2 97%   Physical Exam Vitals and nursing note reviewed.   Gen: NAD Eyes: PERRL, EOMI HEENT: no oropharyngeal swelling Neck: trachea midline Resp: clear to auscultation bilaterally Card: RRR, no murmurs, rubs, or gallops Abd: nontender, nondistended, gravid uterus noted GU: Cervix closed, minimal white discharge noted Extremities: no calf tenderness, no edema Vascular: 2+ radial pulses bilaterally, 2+ DP pulses bilaterally Neuro: Cranial nerves intact, equal strength and sensation throughout bilateral upper and lower extremities with no dysmetria on finger-to-nose testing Skin: no rashes Psyc: acting appropriately   (all labs ordered are listed, but only abnormal results are displayed) Labs Reviewed  BASIC METABOLIC PANEL WITH GFR - Abnormal; Notable for the following components:      Result Value   Creatinine, Ser 0.38 (*)    All other components within normal limits  CBC - Abnormal; Notable for the following components:   WBC 10.7 (*)    Hemoglobin 10.6 (*)    HCT 31.6 (*)    MCV 79.0 (*)    All other components within normal limits  WET PREP, GENITAL  URINALYSIS, ROUTINE W  REFLEX MICROSCOPIC  GC/CHLAMYDIA PROBE AMP ([place]) NOT AT [facility]  TROPONIN T, HIGH SENSITIVITY    EKG: EKG Interpretation Date/Time:  Tuesday March 09 2024 14:04:10 EDT Ventricular Rate:  87 PR Interval:  150 QRS Duration:  86 QT Interval:  339 QTC Calculation: 408 R Axis:   35  Text Interpretation: Sinus rhythm Confirmed by Ula Barter [phone]) on 03/09/2024 2:08:49 PM  Radiology: No results found.   Procedures   Medications Ordered in the ED - No data to display                                  Medical Decision Making 23 year old female  who is 26 weeks and 5 days by first trimester ultrasound presenting to the emergency department today with main complaint of chest pressure.  I will further evaluate the patient here with basic labs as well as an EKG, chest x-ray, and troponin for further evaluation for ACS, pulmonary edema, pulmonary infiltrates, pneumothorax.  She is denying any pleuritic pain and with reassuring vitals suspicion for pulmonary embolism is low at this time.  Given reassuring evaluation and suspicion for aortic dissection is low.  Given her complaint of decreased fetal movement and intermittent abdominal cramping will discuss her case with her OB GYN on-call and after her initial evaluation here in the emergency department.  In regards to her headache this has been an on-and-off issue since her pregnancy began.  Blood pressures are stable here so suspicion for preeclampsia is low.  Also, given the duration of symptoms suspicion for venous sinus thrombosis is also low at this time given her reassuring neuroexam.  The patient's EKG interpreted by me does not show any concerning findings.  Initial troponin here is negative.  She has been having symptoms now for greater than 24 hours so I do not think that a repeat is warranted.  We were informed by the OB/GYN Dr. Ozan that they would like for the patient to go over to the MAU for further evaluation.  Chest x-ray interpreted by me does not show any acute infiltrates.  Plan is for transfer over to the MAU for further fetal monitoring.  Amount and/or Complexity of Data Reviewed Labs: ordered. Radiology: ordered.        Final diagnoses:  Chest pressure    ED Discharge Orders     None          Ula Barter SAUNDERS, MD 03/09/24 1504    Ula Barter SAUNDERS, MD 03/10/24 [phone]

## 2024-03-09 NOTE — Telephone Encounter (Signed)
 Pt. Called she is having swelling in her hands a feet headache will not go away with meds also having chest pains under both breast jls

## 2024-03-10 LAB — GC/CHLAMYDIA PROBE AMP (~~LOC~~) NOT AT ARMC
Chlamydia: NEGATIVE
Comment: NEGATIVE
Comment: NORMAL
Neisseria Gonorrhea: NEGATIVE

## 2024-06-04 ENCOUNTER — Ambulatory Visit: Admission: EM | Admit: 2024-06-04 | Discharge: 2024-06-04 | Disposition: A | Discharge status: Home / Self Care

## 2024-06-04 DIAGNOSIS — J069 Acute upper respiratory infection, unspecified: Secondary | ICD-10-CM | POA: Diagnosis not present

## 2024-06-04 NOTE — ED Provider Notes (Signed)
 " [facility]    CSN: [id number] Arrival date & time: 06/04/24  1006      History   Chief Complaint Chief Complaint  Patient presents with   Cough    HPI Michele Carney is a 24 y.o. female.   Patient complains of a cough and congestion.  Patient is pregnant.  Patient is here with her child who has had symptoms for the past week and continues to have a cough.  Patient reports that she was feeling achy yesterday.  Patient is experiencing some nausea no vomiting.  Patient has been taking over-the-counter medications without relief.  Patient denies any fever or chills.  Patient wants to make sure that she does not have an ear infection  The history is provided by the patient. No language interpreter was used.  Cough Cough characteristics:  Non-productive Sputum characteristics:  Nondescript   Past Medical History:  Diagnosis Date   Anxiety    Depression     Patient Active Problem List   Diagnosis Date Noted   Sprain of ankle 04/22/2023   Acute right-sided low back pain with right-sided sciatica 03/28/2023   Finger numbness 03/28/2023   Hair loss 11/11/2022   Sciatic nerve pain, right 11/11/2022   GAD (generalized anxiety disorder) 11/11/2022   Well adult exam 03/07/2022   Acute maxillary sinusitis 03/07/2022   Chronic bilateral low back pain without sciatica 11/30/2020   Iron  deficiency anemia 02/10/2020   Cesarean delivery delivered 02/08/2020   Non-reactive NST (non-stress test) 02/06/2020   Significant discrepancy between uterine size and clinical dates, antepartum, second trimester 11/16/2019   GBS (group B streptococcus) UTI complicating pregnancy 08/31/2019   Supervision of normal first pregnancy, antepartum 07/05/2019   MDD (major depressive disorder) 05/25/2019   Chronic migraine without aura without status migrainosus, not intractable 12/30/2017    Past Surgical History:  Procedure Laterality Date   CESAREAN SECTION N/A 02/08/2020    Procedure: CESAREAN SECTION;  Surgeon: Jayne Vonn DEL, MD;  Location: [facility] LD ORS;  Service: Obstetrics;  Laterality: N/A;    OB History     Gravida  2   Para  1   Term  1   Preterm      AB      Living  1      SAB      IAB      Ectopic      Multiple  0   Live Births  1            Home Medications    Prior to Admission medications  Medication Sig Start Date End Date Taking? Authorizing Provider  acetaminophen  (TYLENOL ) 325 MG tablet Take 650 mg by mouth every 6 (six) hours as needed.   Yes [provider]  cyclobenzaprine  (FLEXERIL ) 5 MG tablet Take 5 mg by mouth 3 (three) times daily as needed for muscle spasms.   Yes [provider]  ferrous sulfate  325 (65 FE) MG tablet Take 325 mg by mouth daily with breakfast.   Yes [provider]  magnesium oxide (MAG-OX) 400 (240 Mg) MG tablet Take 400 mg by mouth daily.   Yes [provider]  Prenatal MV & Min w/FA-DHA (PRENATAL GUMMIES) 0.18-25 MG CHEW Chew 2 tablets by mouth daily.   Yes [provider]  cephALEXin  (KEFLEX ) 500 MG capsule Take 500 mg by mouth 4 (four) times daily. Patient not taking: Reported on 06/04/2024 12/23/23   [provider]  DICLEGIS  10-10 MG TBEC  Diclegis  brand only; Take 1 tablet at bedtime for nausea. If symptoms persist on Day 2, then take 2 tablets at bedtime. If symptoms persist on Day 3, take 1 tablet in the morning and 2 tablets at bedtime. If symptoms continue to persist, can take 2 tablets in the morning and 2 tablets at bedtime (max dose) Patient not taking: Reported on 03/09/2024 11/10/23   Vivienne Delon HERO, PA-C  metoCLOPramide  (REGLAN ) 5 MG tablet Take 5 mg by mouth 3 (three) times daily as needed. Patient not taking: Reported on 06/04/2024 01/20/24   [provider]  ondansetron  (ZOFRAN -ODT) 4 MG disintegrating tablet Take 4 mg by mouth every 8 (eight) hours as needed. Patient not taking: Reported on 06/04/2024 12/19/23    [provider]  terconazole  (TERAZOL 3 ) 0.8 % vaginal cream SMARTSIG:1 Applicator Vaginal Daily Patient not taking: Reported on 06/04/2024 01/20/24   [provider]    Family History Family History  Problem Relation Age of Onset   Diabetes Maternal Grandmother    Cancer Maternal Grandfather        colon   Colon cancer Maternal Grandfather     Social History Social History[1]   Allergies   Shellfish allergy   Review of Systems Review of Systems  Respiratory:  Positive for cough.   All other systems reviewed and are negative.    Physical Exam Triage Vital Signs ED Triage Vitals  Encounter Vitals Group     BP 06/04/24 1033 108/73     Girls Systolic BP Percentile --      Girls Diastolic BP Percentile --      Boys Systolic BP Percentile --      Boys Diastolic BP Percentile --      Pulse Rate 06/04/24 1033 (!) 108     Resp 06/04/24 1033 16     Temp 06/04/24 1033 98 F (36.7 C)     Temp Source 06/04/24 1033 Oral     SpO2 06/04/24 1033 96 %     Weight --      Height --      Head Circumference --      Peak Flow --      Pain Score 06/04/24 1027 4     Pain Loc --      Pain Education --      Exclude from Growth Chart --    No data found.  Updated Vital Signs BP 108/73 (BP Location: Right Arm)   Pulse (!) 108   Temp 98 F (36.7 C) (Oral)   Resp 16   LMP 09/04/2023   SpO2 96%   Visual Acuity Right Eye Distance:   Left Eye Distance:   Bilateral Distance:    Right Eye Near:   Left Eye Near:    Bilateral Near:     Physical Exam Vitals and nursing note reviewed.  Constitutional:      Appearance: She is well-developed.  HENT:     Head: Normocephalic.     Right Ear: Tympanic membrane normal.     Left Ear: Tympanic membrane normal.     Mouth/Throat:     Mouth: Mucous membranes are moist.  Cardiovascular:     Rate and Rhythm: Normal rate.  Pulmonary:     Effort: Pulmonary effort is normal.  Abdominal:     General: There is no  distension.  Musculoskeletal:        General: Normal range of motion.     Cervical back: Normal range of motion.  Skin:  General: Skin is warm.  Neurological:     General: No focal deficit present.     Mental Status: She is alert and oriented to person, place, and time.      UC Treatments / Results  Labs (all labs ordered are listed, but only abnormal results are displayed) Labs Reviewed - No data to display  EKG   Radiology No results found.  Procedures Procedures (including critical care time)  Medications Ordered in UC Medications - No data to display  Initial Impression / Assessment and Plan / UC Course  I have reviewed the triage vital signs and the nursing notes.  Pertinent labs & imaging results that were available during my care of the patient were reviewed by me and considered in my medical decision making (see chart for details).     Patient looks well overall.  I suspect viral respiratory infection.  Patient is advised Tylenol  for body aches encouraged to drink fluids.  Patient is discharged in stable condition Final Clinical Impressions(s) / UC Diagnoses   Final diagnoses:  Acute upper respiratory infection     Discharge Instructions      Return if any problems.    ED Prescriptions   None    PDMP not reviewed this encounter. An After Visit Summary was printed and given to the patient.        [1]  Social History Tobacco Use   Smoking status: Never    Passive exposure: Never   Smokeless tobacco: Never  Vaping Use   Vaping status: Never Used  Substance Use Topics   Alcohol use: Not Currently    Alcohol/week: 0.0 - 1.0 standard drinks of alcohol   Drug use: Not Currently    Types: Marijuana    Comment: occasionally. last smoked 2 wks ago     Fl[place] Sonny POUR, [place] 06/04/24 1114  "

## 2024-06-04 NOTE — ED Triage Notes (Addendum)
 Pt reports headache, runny nose, cough, sore throat, body aches since Wednesday. Taking tylenol  cold and flu without relief. Last dose yesterday evening. Pt is [redacted] weeks pregnant

## 2024-06-04 NOTE — Discharge Instructions (Addendum)
 Return if any problems.
